# Patient Record
Sex: Male | Born: 1935 | Race: White | Hispanic: No | Smoking: Former smoker
Health system: Southern US, Community
[De-identification: ages and names within clinical notes are randomized; demographics above are authoritative.]

## PROBLEM LIST (undated history)

## (undated) DIAGNOSIS — J189 Pneumonia, unspecified organism: Secondary | ICD-10-CM

## (undated) DIAGNOSIS — I779 Disorder of arteries and arterioles, unspecified: Secondary | ICD-10-CM

## (undated) DIAGNOSIS — M5417 Radiculopathy, lumbosacral region: Secondary | ICD-10-CM

## (undated) DIAGNOSIS — I350 Nonrheumatic aortic (valve) stenosis: Secondary | ICD-10-CM

## (undated) DIAGNOSIS — R011 Cardiac murmur, unspecified: Secondary | ICD-10-CM

## (undated) DIAGNOSIS — E119 Type 2 diabetes mellitus without complications: Secondary | ICD-10-CM

## (undated) DIAGNOSIS — Z952 Presence of prosthetic heart valve: Secondary | ICD-10-CM

## (undated) DIAGNOSIS — I719 Aortic aneurysm of unspecified site, without rupture: Secondary | ICD-10-CM

## (undated) DIAGNOSIS — I509 Heart failure, unspecified: Secondary | ICD-10-CM

## (undated) DIAGNOSIS — M2040 Other hammer toe(s) (acquired), unspecified foot: Secondary | ICD-10-CM

## (undated) DIAGNOSIS — C8590 Non-Hodgkin lymphoma, unspecified, unspecified site: Secondary | ICD-10-CM

## (undated) DIAGNOSIS — G459 Transient cerebral ischemic attack, unspecified: Secondary | ICD-10-CM

## (undated) DIAGNOSIS — Z8673 Personal history of transient ischemic attack (TIA), and cerebral infarction without residual deficits: Secondary | ICD-10-CM

## (undated) DIAGNOSIS — I5032 Chronic diastolic (congestive) heart failure: Secondary | ICD-10-CM

## (undated) DIAGNOSIS — I480 Paroxysmal atrial fibrillation: Secondary | ICD-10-CM

## (undated) DIAGNOSIS — I10 Essential (primary) hypertension: Secondary | ICD-10-CM

## (undated) DIAGNOSIS — I4891 Unspecified atrial fibrillation: Secondary | ICD-10-CM

## (undated) DIAGNOSIS — J45909 Unspecified asthma, uncomplicated: Secondary | ICD-10-CM

## (undated) HISTORY — DX: Cardiac murmur, unspecified: R01.1

## (undated) HISTORY — DX: Other hammer toe(s) (acquired), unspecified foot: M20.40

---
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
## 1985-12-17 HISTORY — PX: TOTAL KNEE ARTHROPLASTY: SHX125

## 1988-11-16 HISTORY — PX: BACK SURGERY: SHX140

## 1989-06-16 HISTORY — PX: KIDNEY STONE SURGERY: SHX686

## 1994-12-17 HISTORY — PX: FOOT SURGERY: SHX648

## 1996-12-17 HISTORY — PX: ROTATOR CUFF REPAIR: SHX139

## 1999-11-17 HISTORY — PX: CATARACT EXTRACTION W/ INTRAOCULAR LENS IMPLANT: SHX1309

## 2000-07-17 HISTORY — PX: CATARACT EXTRACTION W/ INTRAOCULAR LENS IMPLANT: SHX1309

## 2002-05-17 HISTORY — PX: OTHER SURGICAL HISTORY: SHX169

## 2005-11-06 ENCOUNTER — Ambulatory Visit: Payer: Self-pay | Admitting: Family Medicine

## 2005-11-06 IMAGING — US US CAROTID DUPLEX BILAT
1 series · 17 of 24 positions shown · non-contrast
Comparison: none

REASON FOR EXAM: Right carotid bruit, history of  TIA
COMMENTS:

[Series 1: us carotid duplex bilat · 17 of 57 slices shown]
[im 1/57]
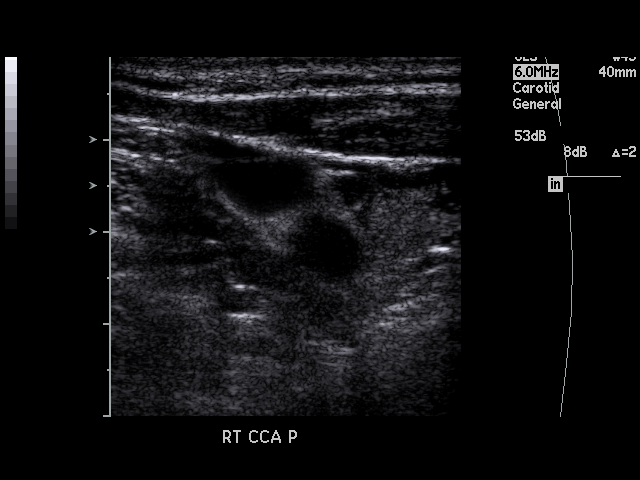
[im 5/57]
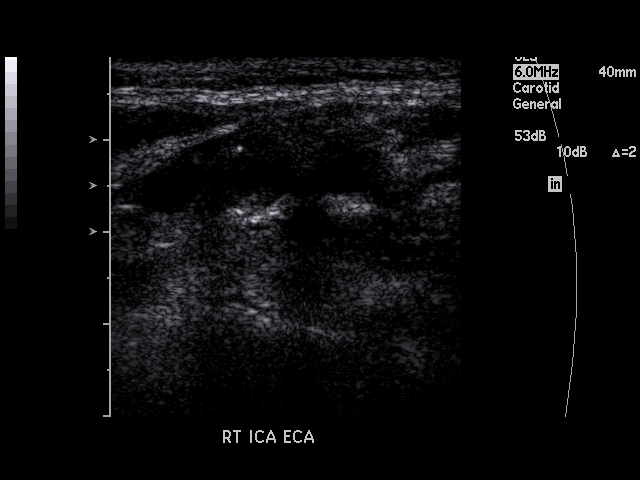
[im 8/57]
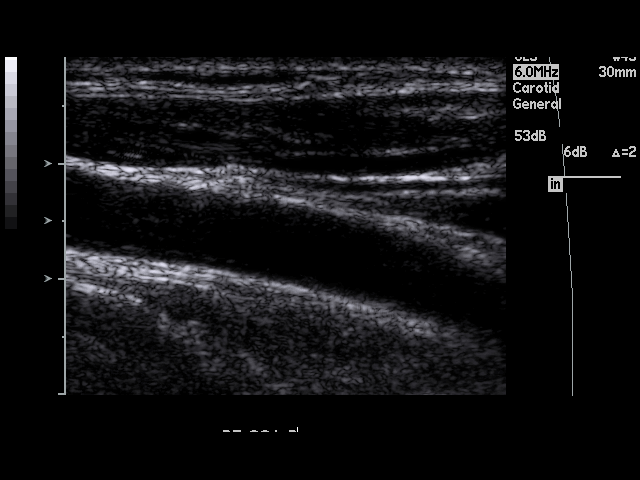
[im 10/57]
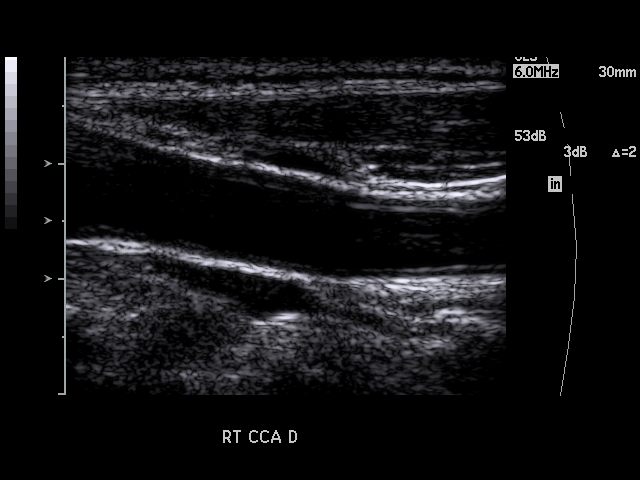
[im 15/57]
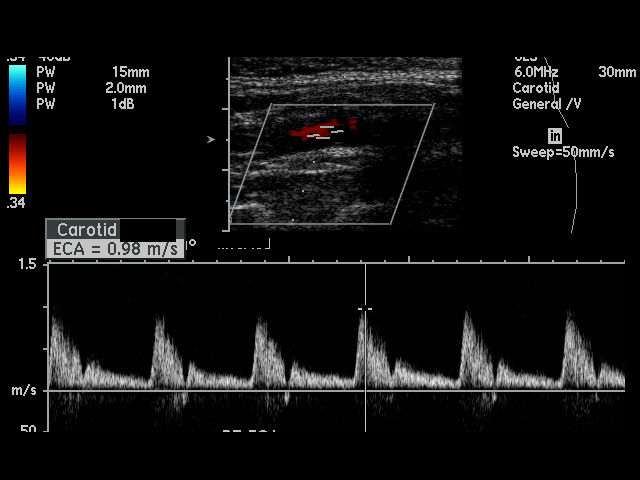
[im 18/57]
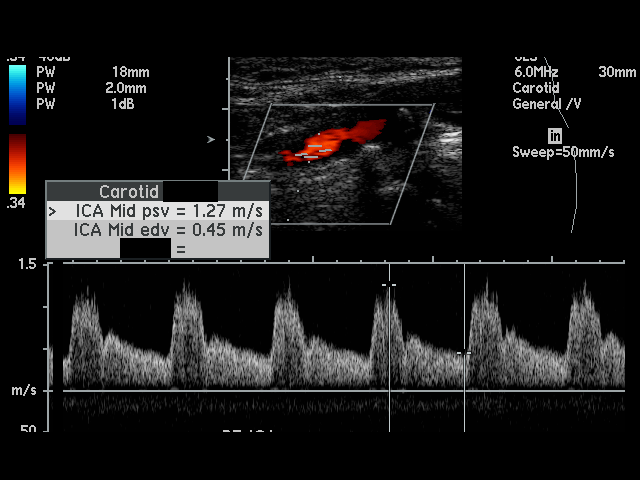
[im 22/57]
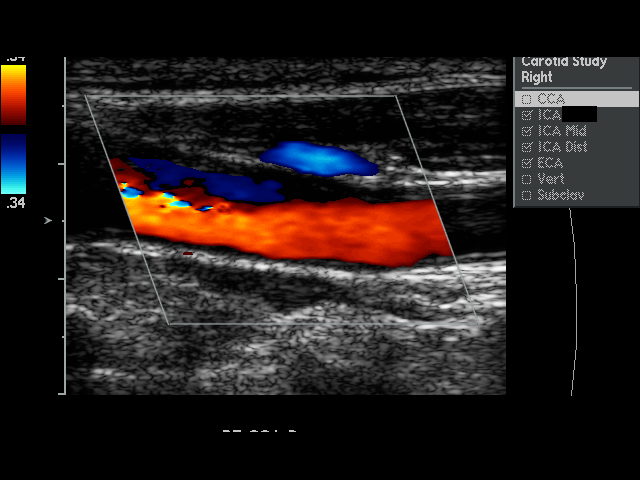
[im 25/57]
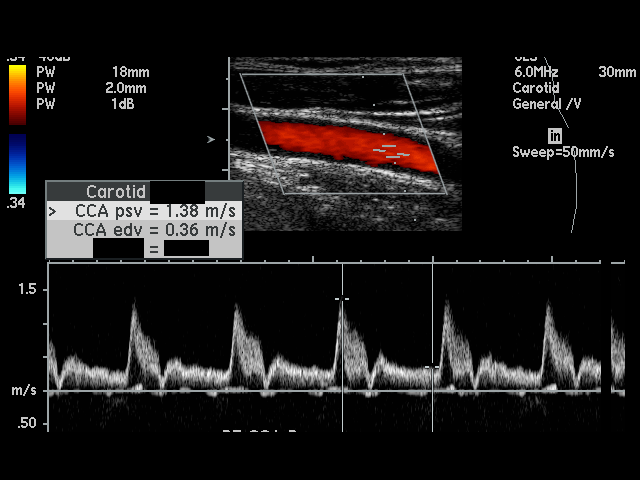
[im 30/57]
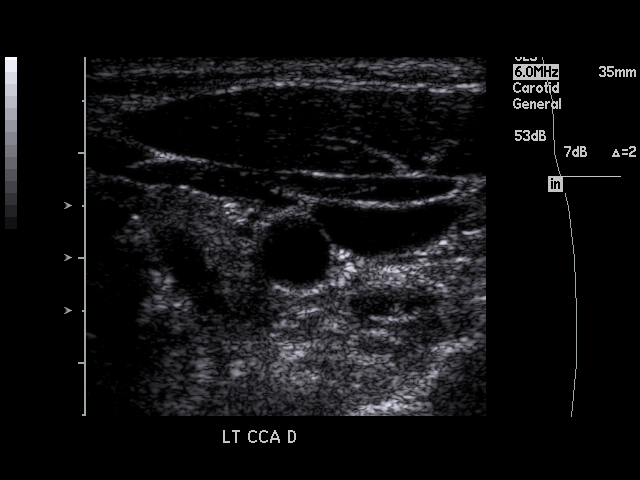
[im 32/57]
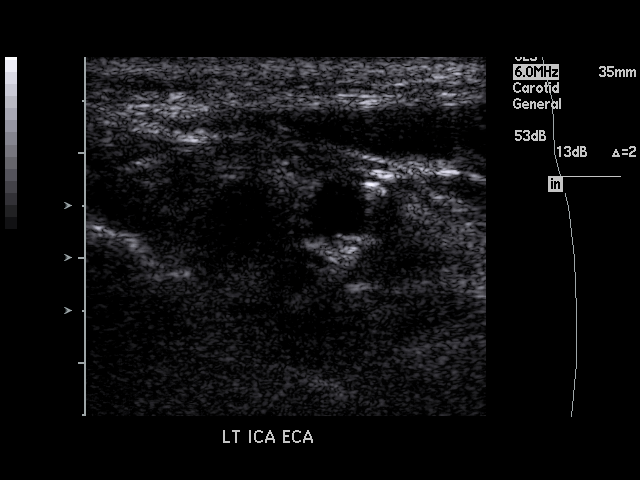
[im 35/57]
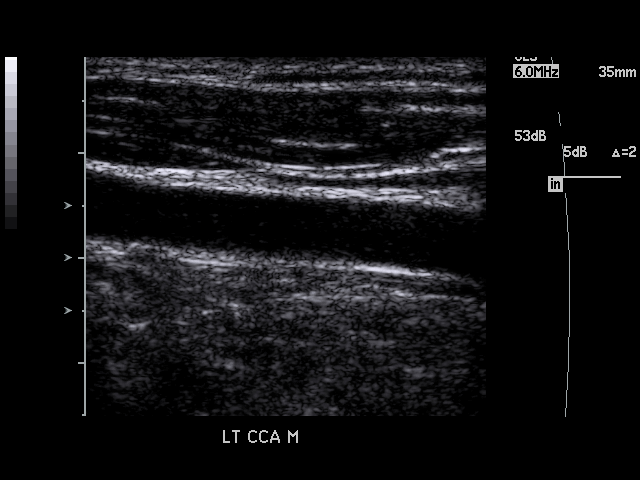
[im 39/57]
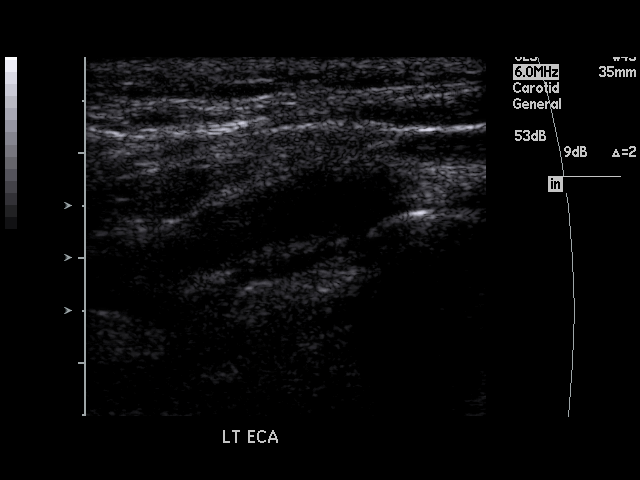
[im 42/57]
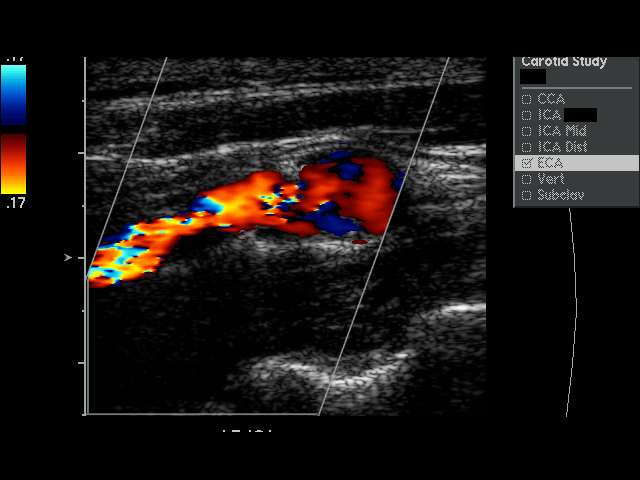
[im 47/57]
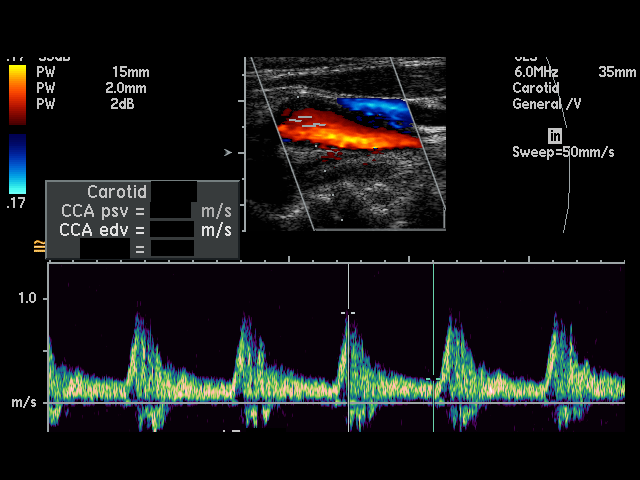
[im 49/57]
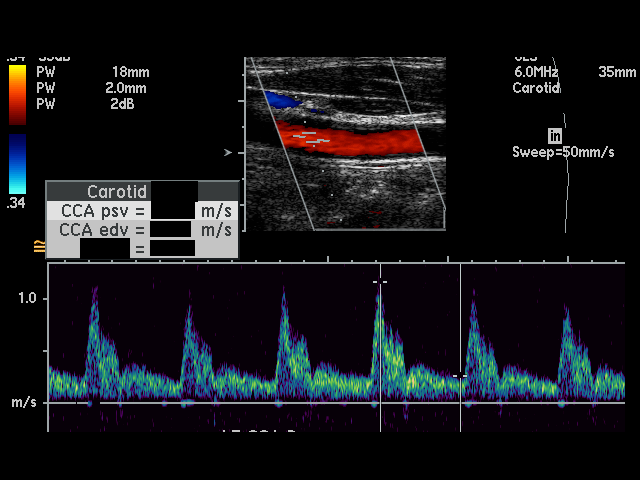
[im 52/57]
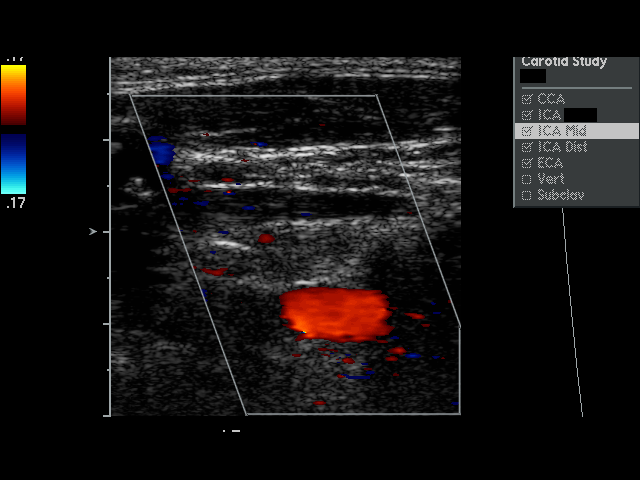
[im 57/57]
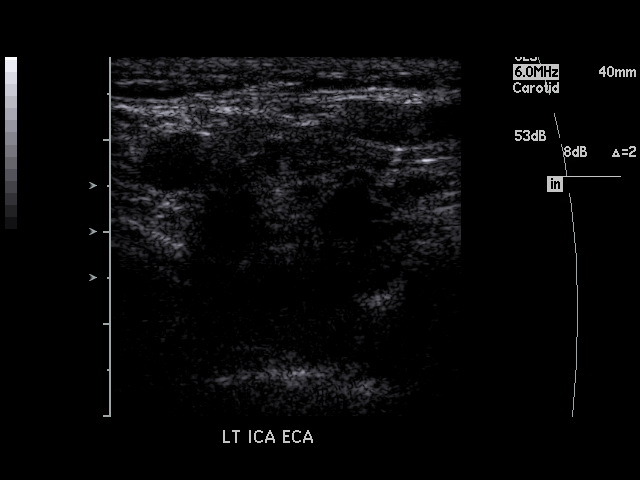

[17 of 24 positions shown; findings below may reference images not displayed]

PROCEDURE:     US  - US CAROTID DOPPLER BILATERAL  - [DATE] [DATE]

RESULT:     The patient has a history of TIA and has a RIGHT carotid bruit.

There is soft plaque at the carotid bulb and some soft and calcified plaque
in the proximal ICA on the RIGHT.

Peak internal carotid systolic velocity on the RIGHT measures 127.0 cm per
second with peak common carotid velocity measuring 138.0 cm per second,
corresponding to ratio of 0.9.

On the LEFT there is calcified plaque in the proximal ICA and the bulb. Peak
internal carotid systolic velocity on the LEFT is 88.0 cm per second with
peak common carotid velocity of 143.0 cm per second, corresponding to a
ratio of 0.6.

The color flow images reveal moderate amounts of turbulence on the LEFT.
This is in the ICA.

The vertebral arteries are normal in flow direction bilaterally.
IMPRESSION: I do not see evidence of a hemodynamically significant
carotid stenosis. There is plaque present bilaterally.

## 2006-08-06 ENCOUNTER — Inpatient Hospital Stay: Payer: Self-pay | Admitting: Specialist

## 2006-08-09 ENCOUNTER — Ambulatory Visit: Payer: Self-pay | Admitting: Cardiovascular Disease

## 2010-11-29 ENCOUNTER — Ambulatory Visit: Payer: Self-pay | Admitting: Family Medicine

## 2013-09-24 ENCOUNTER — Encounter: Payer: Self-pay | Admitting: Podiatry

## 2013-09-28 ENCOUNTER — Ambulatory Visit (INDEPENDENT_AMBULATORY_CARE_PROVIDER_SITE_OTHER): Payer: Medicare Other

## 2013-09-28 ENCOUNTER — Ambulatory Visit (INDEPENDENT_AMBULATORY_CARE_PROVIDER_SITE_OTHER): Payer: Medicare Other | Admitting: Podiatry

## 2013-09-28 ENCOUNTER — Encounter: Payer: Self-pay | Admitting: Podiatry

## 2013-09-28 ENCOUNTER — Ambulatory Visit: Payer: Self-pay

## 2013-09-28 DIAGNOSIS — M19079 Primary osteoarthritis, unspecified ankle and foot: Secondary | ICD-10-CM

## 2013-09-28 DIAGNOSIS — M79609 Pain in unspecified limb: Secondary | ICD-10-CM

## 2013-09-28 DIAGNOSIS — R609 Edema, unspecified: Secondary | ICD-10-CM

## 2013-09-28 NOTE — Progress Notes (Signed)
N SORE AND SWELLING  L LEFT ACROSS TOP OF FOOT AND BALL  D 3 MONTHS  O GRADUAL  C ABOUT THE SAME  A A LOT OF WALKING  T IBUPROFEN

## 2013-09-28 NOTE — Progress Notes (Signed)
Horace presents today as an 79 year old white male with a chief complaint of painful left foot. He states this been hurting on the top. Been hurting him for about 3 months. Has been to see his doctor at [facility]. Is Dr. Joella Prince suggested that it is really only osteoarthritis. This is the same doctors reconstructed his feet bilaterally. He states that the foot stays swollen primarily because of his left knee replacement. But has become more painful and the past few weeks. He denies any trauma.  Objective: Vital signs are stable he is alert and oriented x3. I have reviewed his past medical history medications allergies and review of systems. Has palpable pulses to the bilateral lower extremity. Marked edema to the left lower extremity. Neurologic sensorium is intact 1 through 5 bilaterally. Deep tendon reflexes are intact bilateral. Muscle strength is intact bilateral. Orthopedic evaluation should all joints distal to the ankle a full range of motion without crepitus. Several surgical incisions to the dorsal aspect of the bilateral foot. Radiographically evaluation does demonstrate osteoarthritis midfoot and forefoot. Edema left.  Assessment: Edema left. Osteoarthritis left.  Plan: Discussed surgical intervention. Etiology and pathology was discussed. At this point we discussed injection therapy. He declined. I will followup with him as needed.

## 2015-02-03 ENCOUNTER — Emergency Department: Payer: Self-pay | Admitting: Internal Medicine

## 2015-02-03 IMAGING — CR DG ABDOMEN 3V
1 series · 3 of 3 positions shown · non-contrast
Comparison: None

CLINICAL DATA: Abdominal pain infraumbilical to rectal region, no
bowel movement since [DATE], history of lymphoma post radiation
therapy, recently began taking new narcotic, no relief with 2 enemas
today at [M9] hr

EXAM:
ABDOMEN SERIES

[Series 1: w chest pa · 0.14mm/px · 3 of 3 slices shown]
[im 1/3]
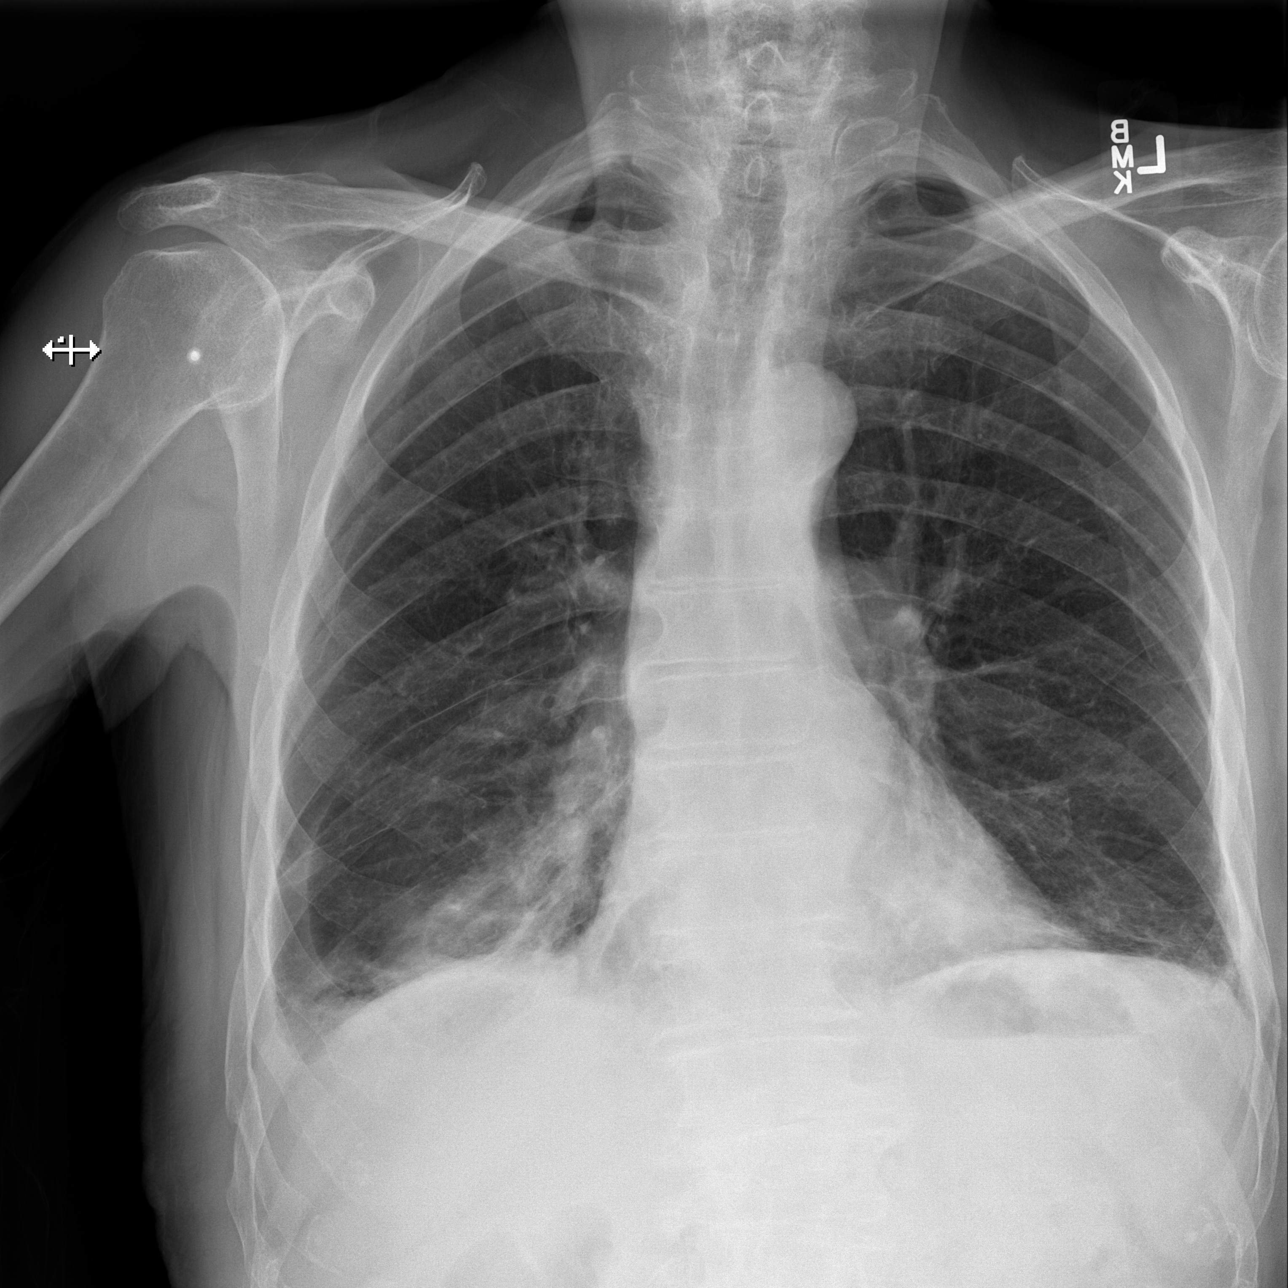
[im 2/3]
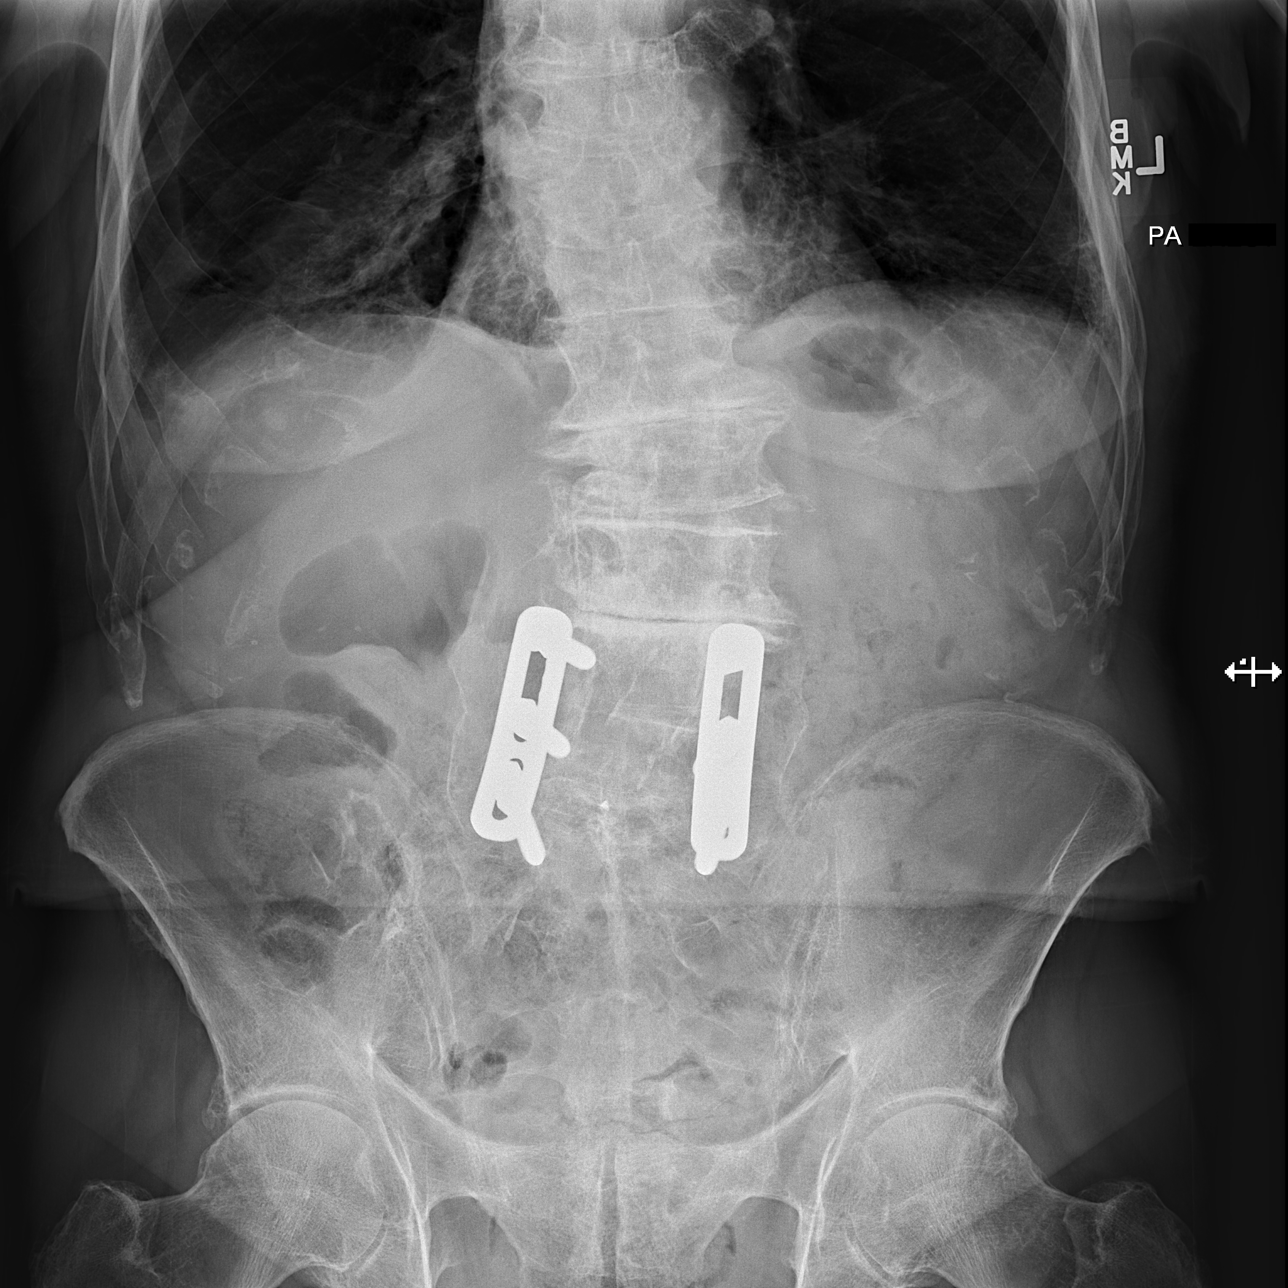
[im 3/3]
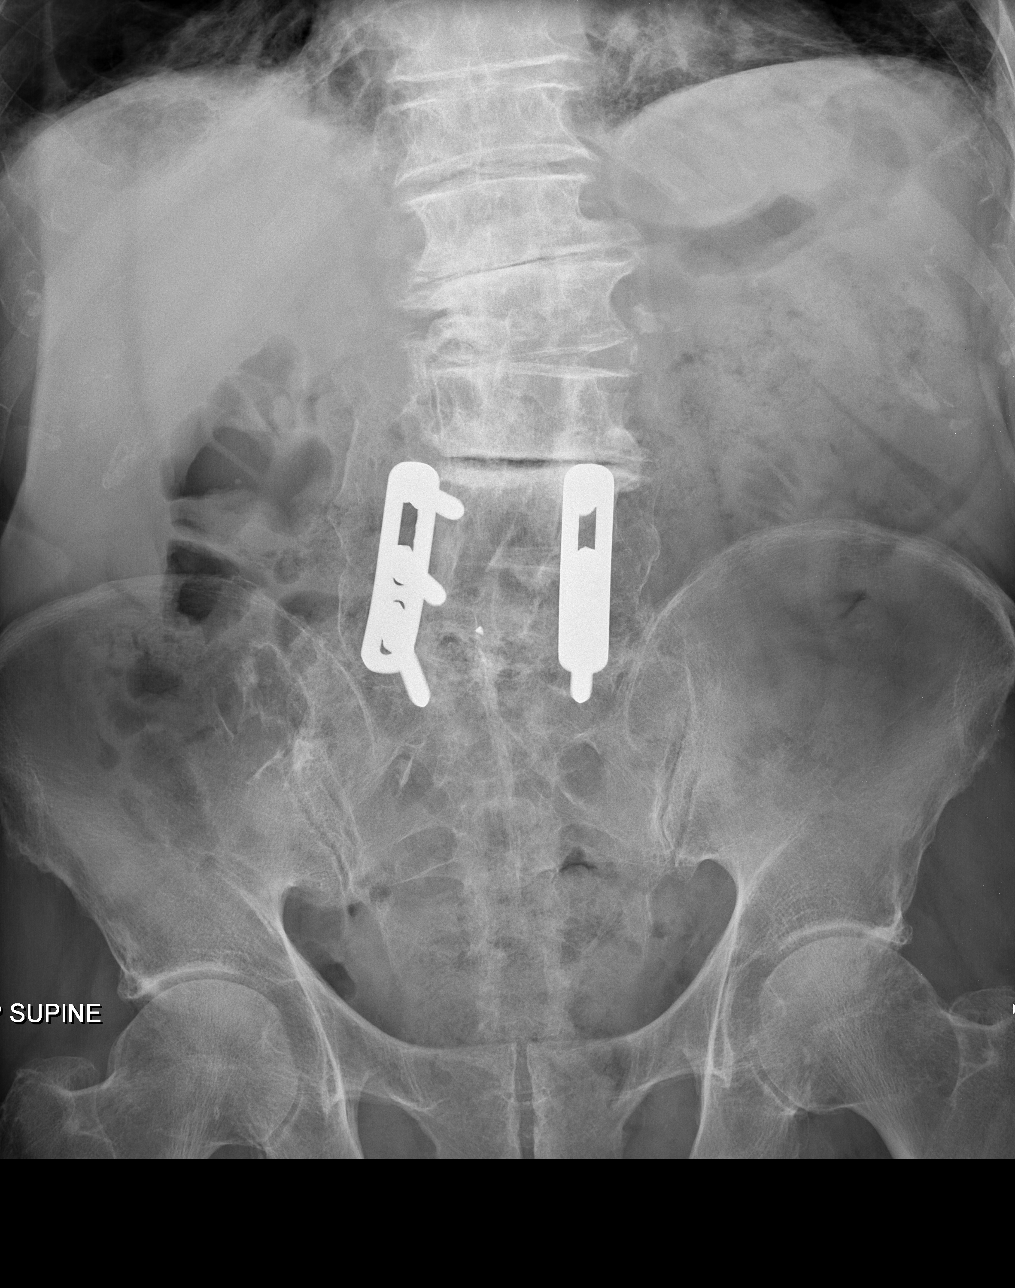

[3 of 3 positions shown; findings below may reference images not displayed]

FINDINGS: Normal heart size and pulmonary vascularity.

Tortuous thoracic aorta.

Bibasilar atelectasis, cannot completely exclude infiltrate medial
RIGHT lower lobe.

Underlying emphysematous changes.

Tiny RIGHT pleural effusion.

No pneumothorax.

Degenerative disc disease changes and scoliosis of the thoracolumbar
spine.

Prior L4-S1 fusion.

Nonobstructive bowel gas pattern.

Slightly increased stool in rectum.

No bowel dilatation, bowel wall thickening or free intraperitoneal
air.
IMPRESSION: Emphysematous changes with bibasilar atelectasis and tiny RIGHT
pleural effusion, unable to exclude RIGHT lower lobe infiltrate.

Slightly increased stool in rectum with otherwise normal bowel gas
pattern.

## 2018-12-23 IMAGING — US US RENAL
1 series · 14 of 25 positions shown · non-contrast
Comparison: None.

CLINICAL DATA: Chronic renal disease.

EXAM:
RENAL / URINARY TRACT ULTRASOUND COMPLETE

[Series 1: us renal · 0.22mm/px · 14 of 38 slices shown]
[im 1/38]
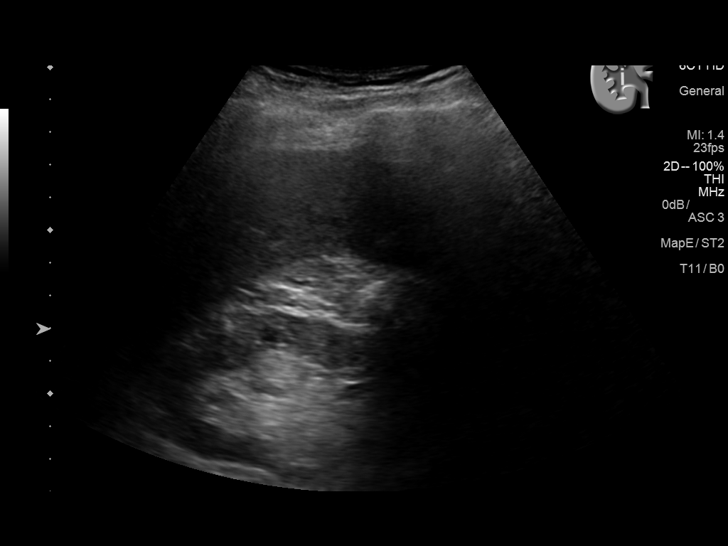
[im 4/38]
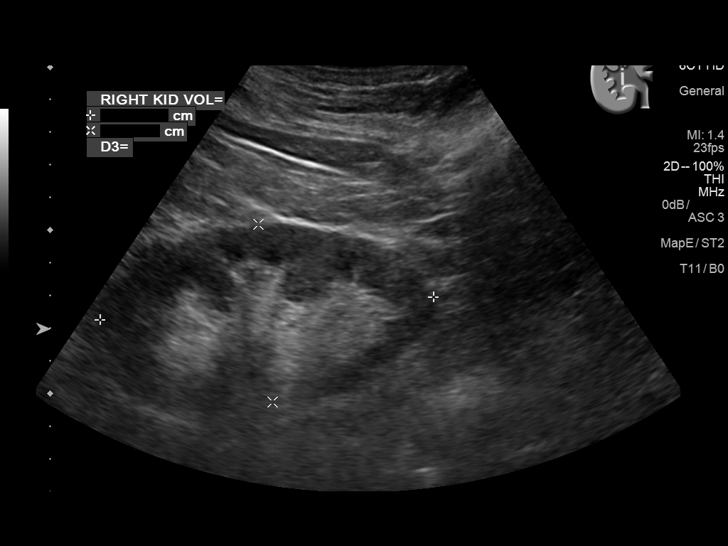
[im 7/38]
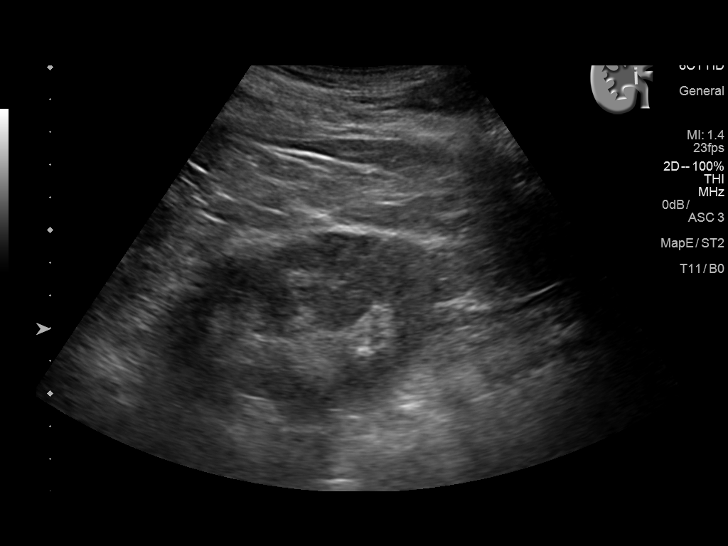
[im 10/38]
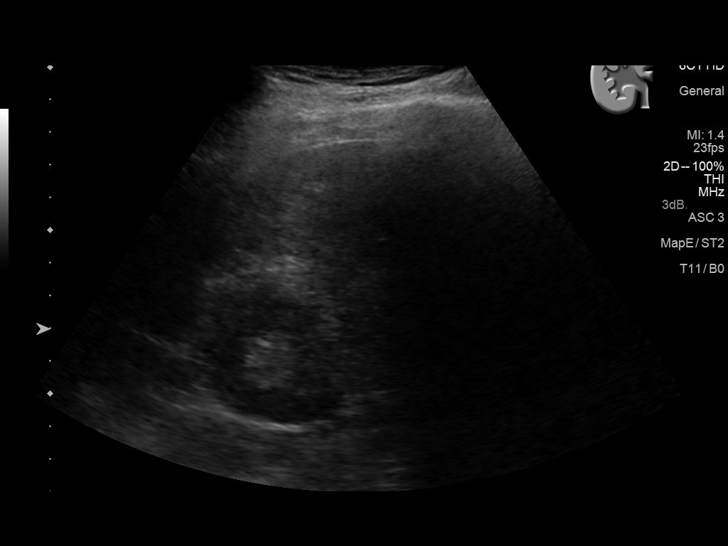
[im 13/38]
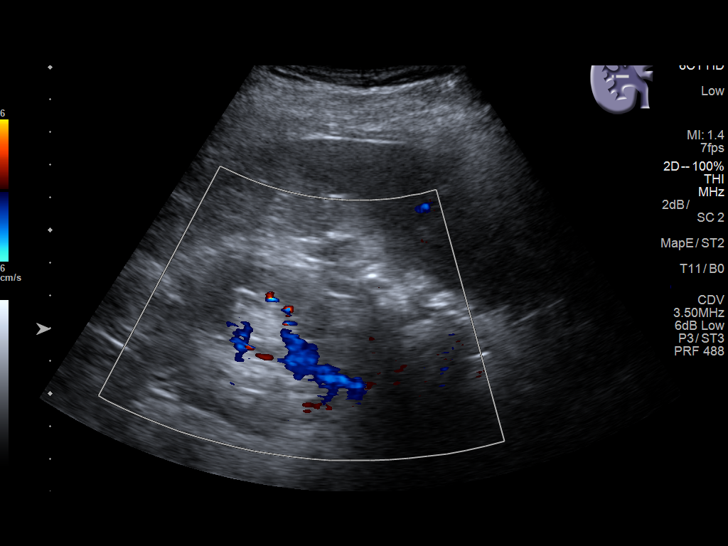
[im 14/38]
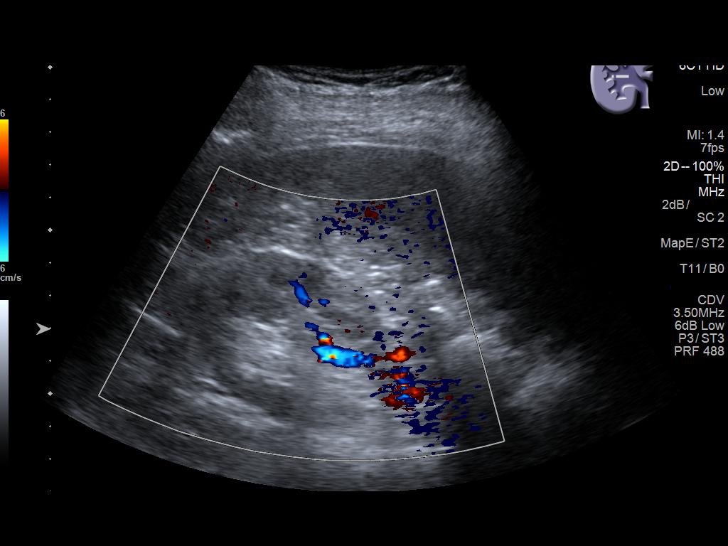
[im 17/38]
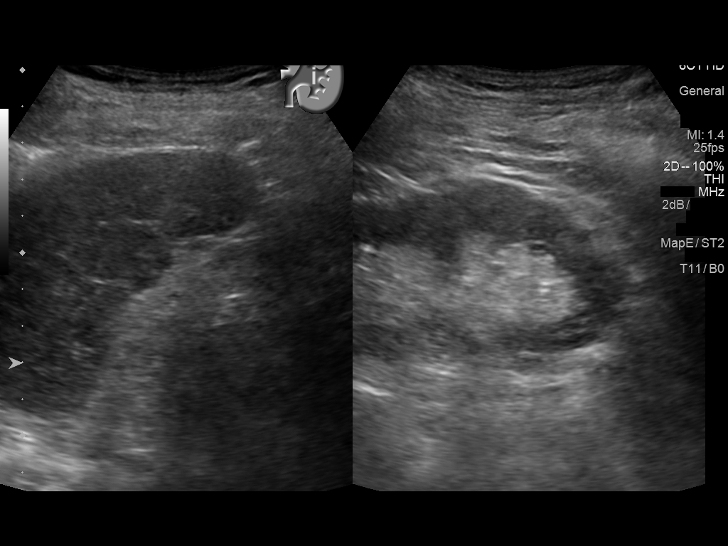
[im 21/38]
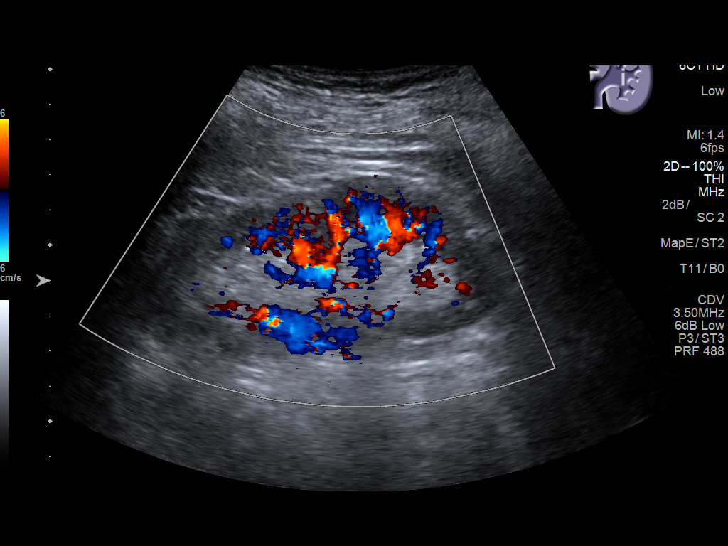
[im 24/38]
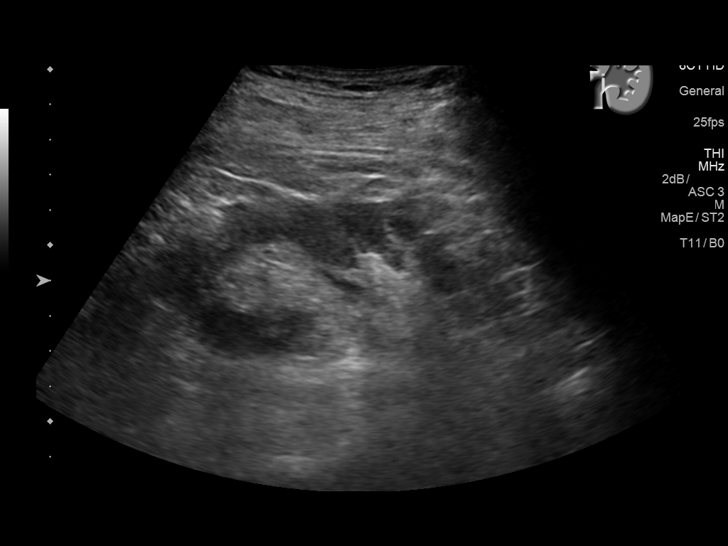
[im 25/38]
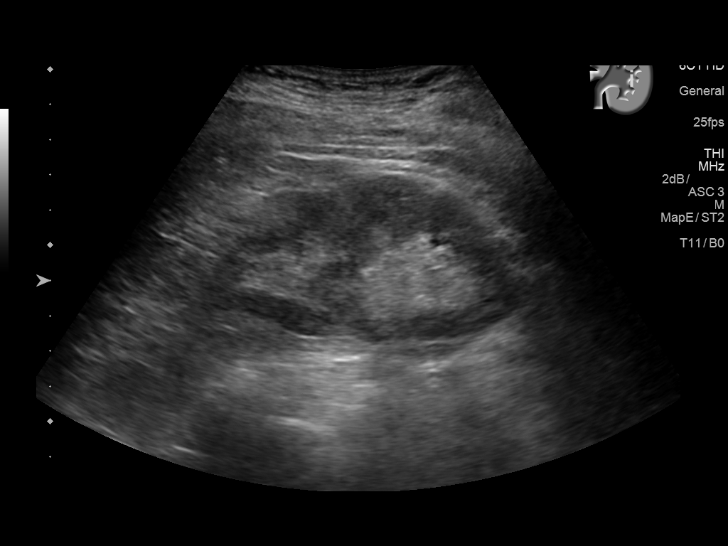
[im 28/38]
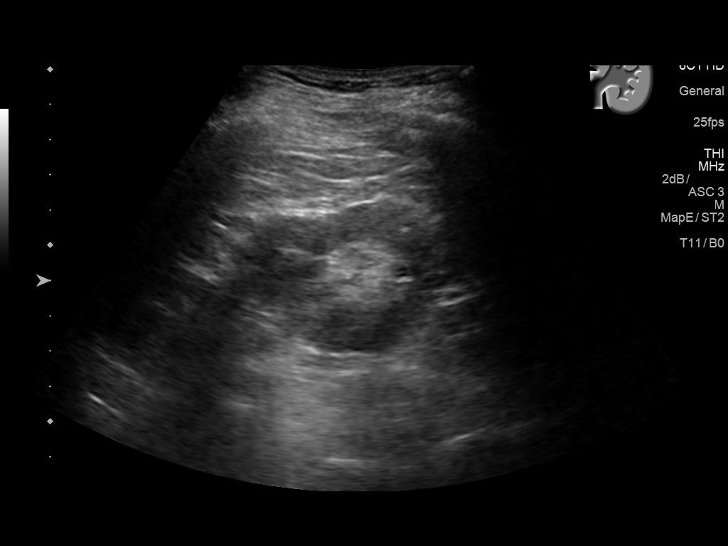
[im 31/38]
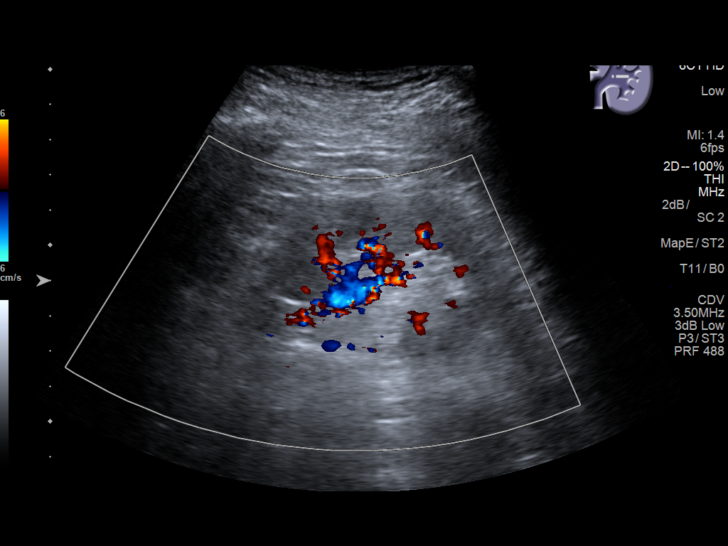
[im 34/38]
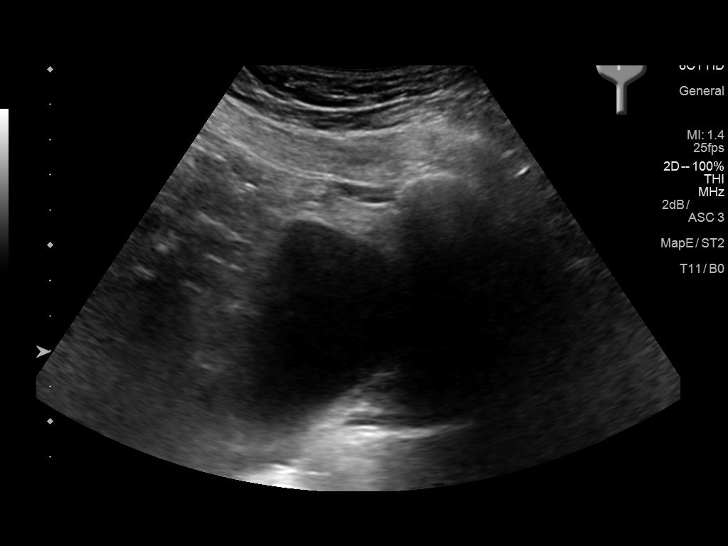
[im 38/38]
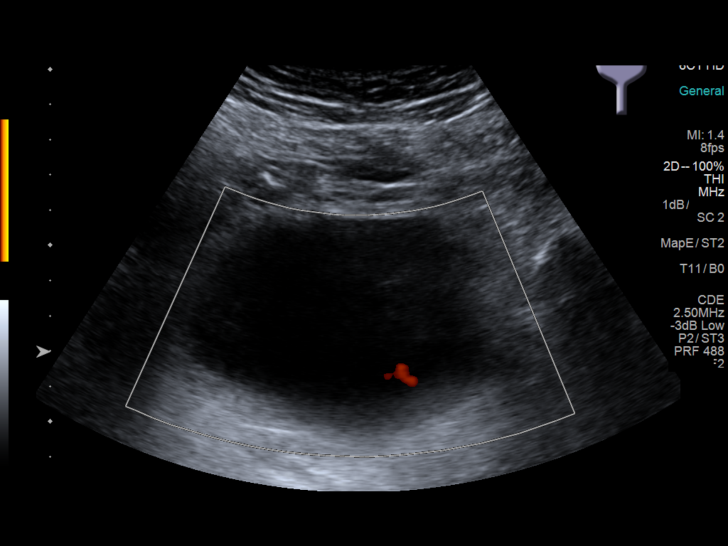

[14 of 25 positions shown; findings below may reference images not displayed]

FINDINGS: Right Kidney:

Renal measurements: 10.2 x 5.5 x 4.1 cm = volume: [87] mL .
Echogenicity within normal limits. No mass or hydronephrosis
visualized.

Left Kidney:

Renal measurements: 11.1 x 4.6 x 4.3 cm = volume: 113.9 mL.
Echogenicity within normal limits. No mass or hydronephrosis
visualized.

Bladder:

Appears normal for degree of bladder distention.
IMPRESSION: No hydronephrosis.

## 2019-02-24 ENCOUNTER — Encounter: Payer: Self-pay | Admitting: Emergency Medicine

## 2019-02-24 ENCOUNTER — Inpatient Hospital Stay
Admission: EM | Admit: 2019-02-24 | Discharge: 2019-03-01 | DRG: 291 | Disposition: A | Discharge status: Home Health | Payer: Medicare Other | Attending: Internal Medicine | Admitting: Internal Medicine

## 2019-02-24 ENCOUNTER — Other Ambulatory Visit: Payer: Self-pay

## 2019-02-24 ENCOUNTER — Emergency Department: Payer: Medicare Other

## 2019-02-24 DIAGNOSIS — R059 Cough, unspecified: Secondary | ICD-10-CM

## 2019-02-24 DIAGNOSIS — R739 Hyperglycemia, unspecified: Secondary | ICD-10-CM | POA: Diagnosis present

## 2019-02-24 DIAGNOSIS — Z87891 Personal history of nicotine dependence: Secondary | ICD-10-CM | POA: Diagnosis not present

## 2019-02-24 DIAGNOSIS — Z8249 Family history of ischemic heart disease and other diseases of the circulatory system: Secondary | ICD-10-CM

## 2019-02-24 DIAGNOSIS — R0600 Dyspnea, unspecified: Secondary | ICD-10-CM

## 2019-02-24 DIAGNOSIS — I480 Paroxysmal atrial fibrillation: Secondary | ICD-10-CM | POA: Diagnosis present

## 2019-02-24 DIAGNOSIS — I248 Other forms of acute ischemic heart disease: Secondary | ICD-10-CM | POA: Diagnosis present

## 2019-02-24 DIAGNOSIS — H9190 Unspecified hearing loss, unspecified ear: Secondary | ICD-10-CM | POA: Diagnosis present

## 2019-02-24 DIAGNOSIS — Z79899 Other long term (current) drug therapy: Secondary | ICD-10-CM

## 2019-02-24 DIAGNOSIS — I5033 Acute on chronic diastolic (congestive) heart failure: Secondary | ICD-10-CM | POA: Diagnosis present

## 2019-02-24 DIAGNOSIS — I5031 Acute diastolic (congestive) heart failure: Secondary | ICD-10-CM | POA: Diagnosis not present

## 2019-02-24 DIAGNOSIS — Z8572 Personal history of non-Hodgkin lymphomas: Secondary | ICD-10-CM | POA: Diagnosis not present

## 2019-02-24 DIAGNOSIS — J9600 Acute respiratory failure, unspecified whether with hypoxia or hypercapnia: Secondary | ICD-10-CM

## 2019-02-24 DIAGNOSIS — J181 Lobar pneumonia, unspecified organism: Secondary | ICD-10-CM | POA: Diagnosis present

## 2019-02-24 DIAGNOSIS — Z7982 Long term (current) use of aspirin: Secondary | ICD-10-CM | POA: Diagnosis not present

## 2019-02-24 DIAGNOSIS — R0603 Acute respiratory distress: Secondary | ICD-10-CM | POA: Diagnosis present

## 2019-02-24 DIAGNOSIS — R05 Cough: Secondary | ICD-10-CM

## 2019-02-24 DIAGNOSIS — I130 Hypertensive heart and chronic kidney disease with heart failure and stage 1 through stage 4 chronic kidney disease, or unspecified chronic kidney disease: Principal | ICD-10-CM | POA: Diagnosis present

## 2019-02-24 DIAGNOSIS — Z8673 Personal history of transient ischemic attack (TIA), and cerebral infarction without residual deficits: Secondary | ICD-10-CM

## 2019-02-24 DIAGNOSIS — J9601 Acute respiratory failure with hypoxia: Secondary | ICD-10-CM | POA: Diagnosis present

## 2019-02-24 DIAGNOSIS — Z96653 Presence of artificial knee joint, bilateral: Secondary | ICD-10-CM | POA: Diagnosis present

## 2019-02-24 DIAGNOSIS — Z66 Do not resuscitate: Secondary | ICD-10-CM | POA: Diagnosis present

## 2019-02-24 DIAGNOSIS — I350 Nonrheumatic aortic (valve) stenosis: Secondary | ICD-10-CM | POA: Diagnosis present

## 2019-02-24 DIAGNOSIS — J189 Pneumonia, unspecified organism: Secondary | ICD-10-CM

## 2019-02-24 DIAGNOSIS — E785 Hyperlipidemia, unspecified: Secondary | ICD-10-CM | POA: Diagnosis present

## 2019-02-24 DIAGNOSIS — R0902 Hypoxemia: Secondary | ICD-10-CM

## 2019-02-24 DIAGNOSIS — N183 Chronic kidney disease, stage 3 (moderate): Secondary | ICD-10-CM | POA: Diagnosis present

## 2019-02-24 DIAGNOSIS — I509 Heart failure, unspecified: Secondary | ICD-10-CM

## 2019-02-24 DIAGNOSIS — Z7901 Long term (current) use of anticoagulants: Secondary | ICD-10-CM

## 2019-02-24 HISTORY — DX: Nonrheumatic aortic (valve) stenosis: I35.0

## 2019-02-24 HISTORY — DX: Essential (primary) hypertension: I10

## 2019-02-24 HISTORY — DX: Transient cerebral ischemic attack, unspecified: G45.9

## 2019-02-24 HISTORY — DX: Unspecified atrial fibrillation: I48.91

## 2019-02-24 HISTORY — DX: Aortic aneurysm of unspecified site, without rupture: I71.9

## 2019-02-24 HISTORY — DX: Heart failure, unspecified: I50.9

## 2019-02-24 HISTORY — DX: Unspecified asthma, uncomplicated: J45.909

## 2019-02-24 LAB — TROPONIN I: Troponin I: 0.29 ng/mL (ref <0.03)

## 2019-02-24 LAB — BASIC METABOLIC PANEL
Anion gap: 10 (ref 5–15)
BUN: 25 mg/dL — ABNORMAL HIGH (ref 8–23)
CO2: 20 mmol/L — ABNORMAL LOW (ref 22–32)
Calcium: 9 mg/dL (ref 8.9–10.3)
Chloride: 109 mmol/L (ref 98–111)
Creatinine, Ser: 1.3 mg/dL — ABNORMAL HIGH (ref 0.61–1.24)
GFR calc Af Amer: 57 mL/min — ABNORMAL LOW (ref >60)
GFR calc non Af Amer: 49 mL/min — ABNORMAL LOW (ref >60)
Glucose, Bld: 237 mg/dL — ABNORMAL HIGH (ref 70–99)
POTASSIUM: 4.2 mmol/L (ref 3.5–5.1)
Sodium: 139 mmol/L (ref 135–145)

## 2019-02-24 LAB — BRAIN NATRIURETIC PEPTIDE: B Natriuretic Peptide: 1114 pg/mL — ABNORMAL HIGH (ref 0.0–100.0)

## 2019-02-24 LAB — GLUCOSE, CAPILLARY
Glucose-Capillary: 192 mg/dL — ABNORMAL HIGH (ref 70–99)
Glucose-Capillary: 220 mg/dL — ABNORMAL HIGH (ref 70–99)
Glucose-Capillary: 203 mg/dL — ABNORMAL HIGH (ref 70–99)

## 2019-02-24 LAB — CBC WITH DIFFERENTIAL/PLATELET
Abs Immature Granulocytes: 0.11 10*3/uL — ABNORMAL HIGH (ref 0.00–0.07)
Basophils Absolute: 0 10*3/uL (ref 0.0–0.1)
Basophils Relative: 0 %
Eosinophils Absolute: 0 10*3/uL (ref 0.0–0.5)
Eosinophils Relative: 0 %
HEMATOCRIT: 41.8 % (ref 39.0–52.0)
Hemoglobin: 13.9 g/dL (ref 13.0–17.0)
Immature Granulocytes: 1 %
LYMPHS ABS: 1.6 10*3/uL (ref 0.7–4.0)
Lymphocytes Relative: 9 %
MCH: 32.1 pg (ref 26.0–34.0)
MCHC: 33.3 g/dL (ref 30.0–36.0)
MCV: 96.5 fL (ref 80.0–100.0)
MONO ABS: 0.7 10*3/uL (ref 0.1–1.0)
MONOS PCT: 4 %
Neutro Abs: 14.8 10*3/uL — ABNORMAL HIGH (ref 1.7–7.7)
Neutrophils Relative %: 86 %
Platelets: 155 10*3/uL (ref 150–400)
RBC: 4.33 MIL/uL (ref 4.22–5.81)
RDW: 12.7 % (ref 11.5–15.5)
WBC: 17.3 10*3/uL — ABNORMAL HIGH (ref 4.0–10.5)
nRBC: 0 % (ref 0.0–0.2)

## 2019-02-24 LAB — LACTIC ACID, PLASMA: Lactic Acid, Venous: 1.7 mmol/L (ref 0.5–1.9)

## 2019-02-24 LAB — PROCALCITONIN

## 2019-02-24 LAB — MRSA PCR SCREENING: MRSA BY PCR: NEGATIVE

## 2019-02-24 IMAGING — DX PORTABLE CHEST - 1 VIEW
1 series · 1 of 1 positions shown · non-contrast
Comparison: None.

CLINICAL DATA: Shortness of Breath

EXAM:
PORTABLE CHEST 1 VIEW

[chest ap]
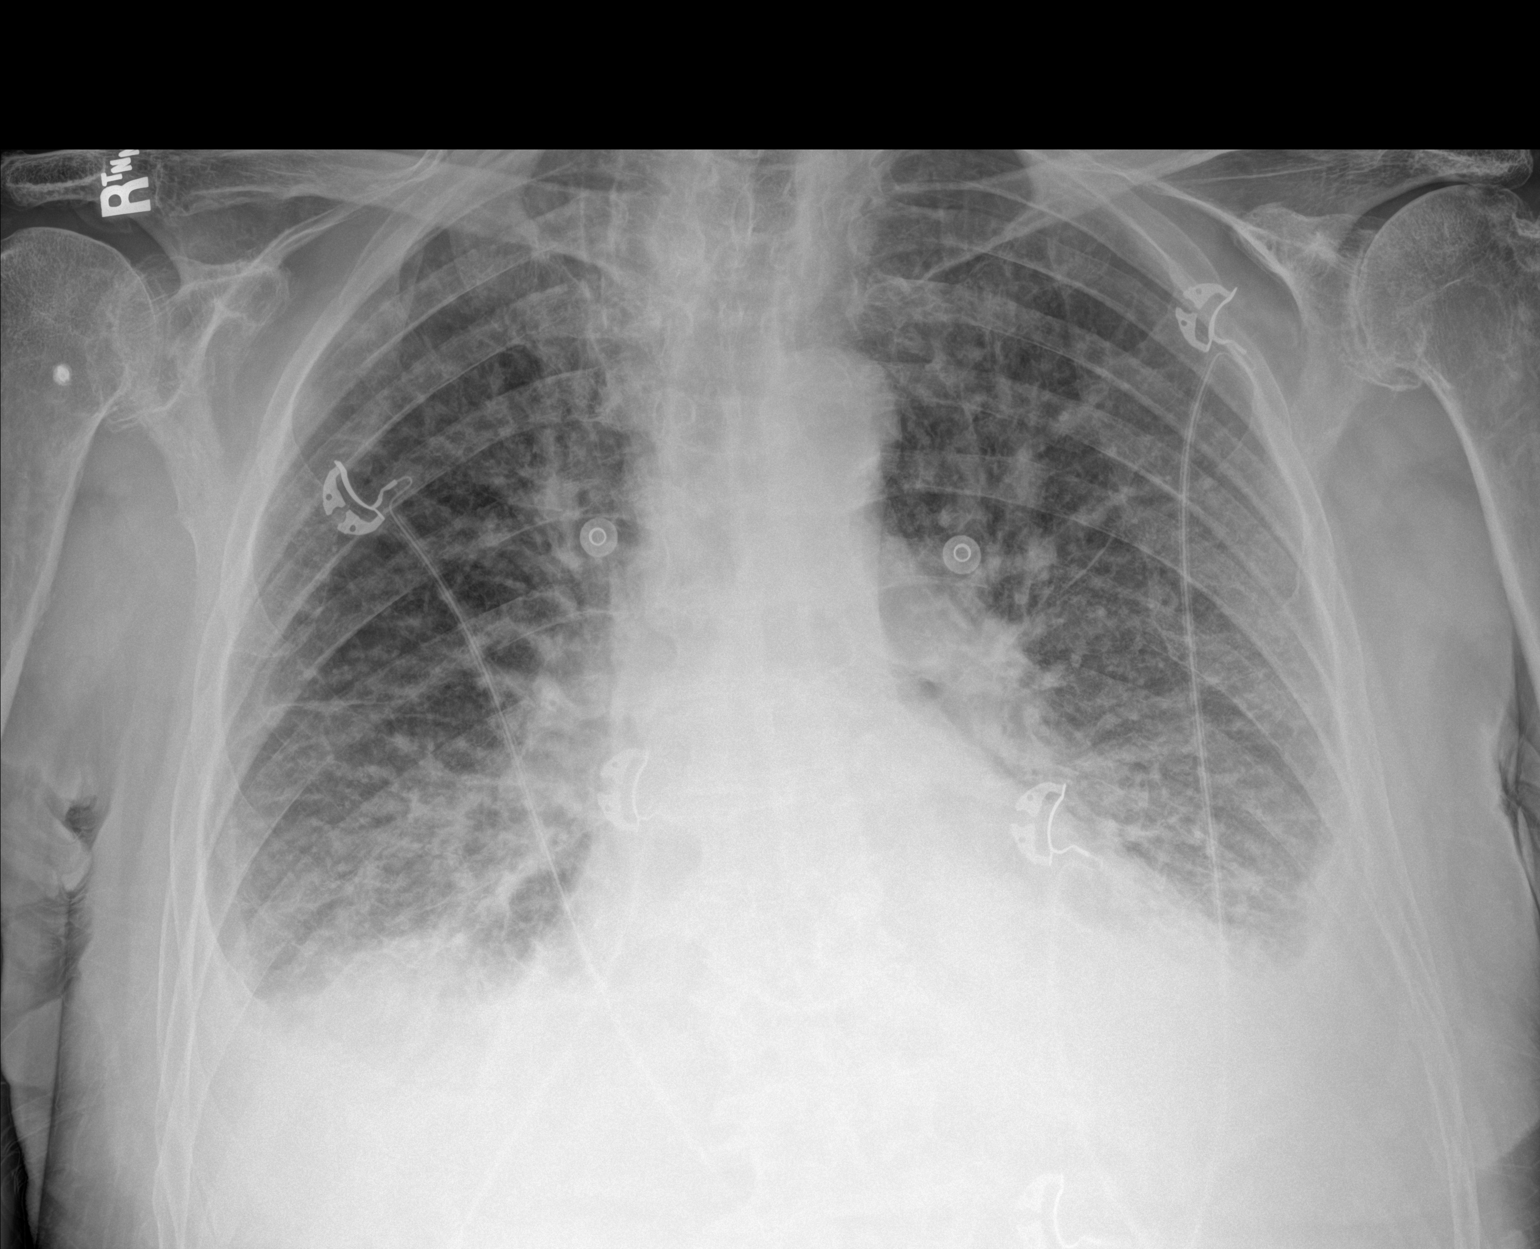

[1 of 1 positions shown; findings below may reference images not displayed]

FINDINGS: There is cardiomegaly with pulmonary venous hypertension. There is
diffuse interstitial edema. There are pleural effusions bilaterally.
There is airspace consolidation in the left base region. No other
airspace consolidation evident. No adenopathy appreciable. There is
degenerative change in each shoulder.
IMPRESSION: Pulmonary vascular congestion with interstitial edema and bilateral
pleural effusions. There is felt to be a degree of congestive heart
failure. Suspect pneumonia superimposed in the left base, although
opacity in this area may be secondary to alveolar edema. No
adenopathy.

## 2019-02-24 MED ORDER — ENOXAPARIN SODIUM 40 MG/0.4ML ~~LOC~~ SOLN: 40.0000 mg | SUBCUTANEOUS | Status: DC

## 2019-02-24 MED ORDER — SODIUM CHLORIDE 0.9 % IV SOLN
500.0000 mg | INTRAVENOUS | Status: DC
  Filled 2019-02-24: qty 500

## 2019-02-24 MED ORDER — MIRTAZAPINE 15 MG PO TABS
7.5000 mg | ORAL_TABLET | Freq: Every day | ORAL | Status: DC
  Administered 2019-02-24 – 2019-02-28 (×5): 7.5 mg via ORAL
  Filled 2019-02-24 (×5): qty 1

## 2019-02-24 MED ORDER — ALBUTEROL SULFATE (2.5 MG/3ML) 0.083% IN NEBU: 2.5000 mg | INHALATION_SOLUTION | RESPIRATORY_TRACT | Status: DC | PRN

## 2019-02-24 MED ORDER — ONDANSETRON HCL 4 MG/2ML IJ SOLN: 4.0000 mg | Freq: Four times a day (QID) | INTRAMUSCULAR | Status: DC | PRN

## 2019-02-24 MED ORDER — APIXABAN 5 MG PO TABS
5.0000 mg | ORAL_TABLET | Freq: Two times a day (BID) | ORAL | Status: DC
  Administered 2019-02-24 (×2): 5 mg via ORAL
  Filled 2019-02-24 (×2): qty 1

## 2019-02-24 MED ORDER — FUROSEMIDE 10 MG/ML IJ SOLN
60.0000 mg | Freq: Two times a day (BID) | INTRAMUSCULAR | Status: DC
  Administered 2019-02-24 – 2019-02-26 (×4): 60 mg via INTRAVENOUS
  Filled 2019-02-24 (×4): qty 6

## 2019-02-24 MED ORDER — FUROSEMIDE 10 MG/ML IJ SOLN
80.0000 mg | Freq: Once | INTRAMUSCULAR | Status: AC
  Administered 2019-02-24: 80 mg via INTRAVENOUS
  Filled 2019-02-24: qty 8

## 2019-02-24 MED ORDER — METOPROLOL SUCCINATE ER 50 MG PO TB24
50.0000 mg | ORAL_TABLET | Freq: Every day | ORAL | Status: DC
  Administered 2019-02-24 – 2019-03-01 (×6): 50 mg via ORAL
  Filled 2019-02-24 (×6): qty 1

## 2019-02-24 MED ORDER — SODIUM CHLORIDE 0.9% FLUSH
3.0000 mL | Freq: Two times a day (BID) | INTRAVENOUS | Status: DC
  Administered 2019-02-24 – 2019-02-28 (×9): 3 mL via INTRAVENOUS

## 2019-02-24 MED ORDER — BUDESONIDE 0.5 MG/2ML IN SUSP
0.5000 mg | Freq: Two times a day (BID) | RESPIRATORY_TRACT | Status: DC
  Administered 2019-02-24 – 2019-03-01 (×10): 0.5 mg via RESPIRATORY_TRACT
  Filled 2019-02-24 (×10): qty 2

## 2019-02-24 MED ORDER — LEVOFLOXACIN IN D5W 750 MG/150ML IV SOLN
750.0000 mg | Freq: Once | INTRAVENOUS | Status: AC
  Administered 2019-02-24: 750 mg via INTRAVENOUS
  Filled 2019-02-24: qty 150

## 2019-02-24 MED ORDER — ATORVASTATIN CALCIUM 20 MG PO TABS
40.0000 mg | ORAL_TABLET | Freq: Every day | ORAL | Status: DC
  Administered 2019-02-24 – 2019-03-01 (×6): 40 mg via ORAL
  Filled 2019-02-24 (×6): qty 2

## 2019-02-24 MED ORDER — MECLIZINE HCL 12.5 MG PO TABS
12.5000 mg | ORAL_TABLET | Freq: Three times a day (TID) | ORAL | Status: DC | PRN
  Filled 2019-02-24: qty 1

## 2019-02-24 MED ORDER — PANTOPRAZOLE SODIUM 40 MG PO TBEC
40.0000 mg | DELAYED_RELEASE_TABLET | Freq: Every day | ORAL | Status: DC
  Administered 2019-02-24 – 2019-03-01 (×6): 40 mg via ORAL
  Filled 2019-02-24 (×6): qty 1

## 2019-02-24 MED ORDER — SODIUM CHLORIDE 0.9 % IV SOLN: 500.0000 mg | INTRAVENOUS | Status: DC

## 2019-02-24 MED ORDER — INSULIN ASPART 100 UNIT/ML ~~LOC~~ SOLN
0.0000 [IU] | SUBCUTANEOUS | Status: DC
  Administered 2019-02-24 (×2): 5 [IU] via SUBCUTANEOUS
  Administered 2019-02-25: 2 [IU] via SUBCUTANEOUS
  Administered 2019-02-25: 3 [IU] via SUBCUTANEOUS
  Filled 2019-02-24 (×4): qty 1

## 2019-02-24 MED ORDER — FLUTICASONE PROPIONATE HFA 110 MCG/ACT IN AERO: 1.0000 | INHALATION_SPRAY | Freq: Two times a day (BID) | RESPIRATORY_TRACT | Status: DC

## 2019-02-24 MED ORDER — FAMOTIDINE 20 MG PO TABS: 10.0000 mg | ORAL_TABLET | Freq: Every day | ORAL | Status: DC | PRN

## 2019-02-24 MED ORDER — POLYETHYLENE GLYCOL 3350 17 G PO PACK
17.0000 g | PACK | Freq: Every day | ORAL | Status: DC | PRN
  Filled 2019-02-24: qty 1

## 2019-02-24 MED ORDER — METOPROLOL SUCCINATE ER 50 MG PO TB24: 50.0000 mg | ORAL_TABLET | Freq: Every day | ORAL | Status: DC

## 2019-02-24 MED ORDER — ACETAMINOPHEN 325 MG PO TABS
650.0000 mg | ORAL_TABLET | Freq: Four times a day (QID) | ORAL | Status: DC | PRN
  Administered 2019-02-25 – 2019-02-28 (×2): 650 mg via ORAL
  Filled 2019-02-24 (×2): qty 2

## 2019-02-24 MED ORDER — SODIUM CHLORIDE 0.9 % IV SOLN
1.0000 g | INTRAVENOUS | Status: DC
  Filled 2019-02-24: qty 10

## 2019-02-24 MED ORDER — GUAIFENESIN ER 600 MG PO TB12: 600.0000 mg | ORAL_TABLET | Freq: Two times a day (BID) | ORAL | Status: DC | PRN

## 2019-02-24 MED ORDER — ASPIRIN EC 81 MG PO TBEC
81.0000 mg | DELAYED_RELEASE_TABLET | Freq: Every day | ORAL | Status: DC
  Administered 2019-02-25 – 2019-03-01 (×5): 81 mg via ORAL
  Filled 2019-02-24 (×5): qty 1

## 2019-02-24 MED ORDER — BENZONATATE 100 MG PO CAPS: 200.0000 mg | ORAL_CAPSULE | Freq: Three times a day (TID) | ORAL | Status: DC | PRN

## 2019-02-24 MED ORDER — SODIUM CHLORIDE 0.9 % IV SOLN: 1.0000 g | INTRAVENOUS | Status: DC

## 2019-02-24 MED ORDER — ACETAMINOPHEN 650 MG RE SUPP: 650.0000 mg | Freq: Four times a day (QID) | RECTAL | Status: DC | PRN

## 2019-02-24 MED ORDER — ONDANSETRON HCL 4 MG PO TABS: 4.0000 mg | ORAL_TABLET | Freq: Four times a day (QID) | ORAL | Status: DC | PRN

## 2019-02-24 NOTE — H&P (Addendum)
[facility] [place] NAME: Jacob Simpson    MR#:  [id number]  DATE OF BIRTH:  11-08-1930  DATE OF ADMISSION:  02/24/2019  PRIMARY CARE PHYSICIAN: Cecile Sheerer, MD   REQUESTING/REFERRING PHYSICIAN: Dr. Jimmye Norman  CHIEF COMPLAINT:   Chief Complaint  Patient presents with  . Shortness of Breath    HISTORY OF PRESENT ILLNESS:  Jacob Simpson  is a 85 y.o. male with a known history of HTN, Afib, Severe aortic stenosis, D CHF, CKD3 here with acute onset SOB started at 1.30 AM. Here CXR shows bilateral pulm edema vs infiltrate and needing Bipap support. Will be admitted to stepdown  PAST MEDICAL HISTORY:   Past Medical History:  Diagnosis Date  . A-fib ([place])   . Aortic aneurysm ([place])   . Aortic stenosis   . Asthma   . CHF (congestive heart failure) ([place])   . Hammertoe   . HTN (hypertension)   . TIA (transient ischemic attack)     PAST SURGICAL HISTORY:   Past Surgical History:  Procedure Laterality Date  . BACK SURGERY  12/89  . CATARACT EXTRACTION W/ INTRAOCULAR LENS IMPLANT Right 12/00  . CATARACT EXTRACTION W/ INTRAOCULAR LENS IMPLANT Left 8/01  . foot sugery Right 6/03  . FOOT SURGERY Left 1/96   hammertoe  . KIDNEY STONE SURGERY  7/90  . ROTATOR CUFF REPAIR Right 1/98  . TOTAL KNEE ARTHROPLASTY Bilateral 1987    SOCIAL HISTORY:   Social History   Tobacco Use  . Smoking status: Former Research scientist (life sciences)  . Smokeless tobacco: Never Used  . Tobacco comment: quit 1989  Substance Use Topics  . Alcohol use: No    FAMILY HISTORY:   Family History  Problem Relation Age of Onset  . Heart disease Father     DRUG ALLERGIES:   Allergies  Allergen Reactions  . Penicillins Rash    REVIEW OF SYSTEMS:   Review of Systems  Constitutional: Positive for malaise/fatigue. Negative for chills and fever.  HENT: Negative for sore throat.   Eyes: Negative for blurred vision, double vision and pain.  Respiratory: Positive  for cough and shortness of breath. Negative for hemoptysis and wheezing.   Cardiovascular: Positive for orthopnea. Negative for chest pain, palpitations and leg swelling.  Gastrointestinal: Negative for abdominal pain, constipation, diarrhea, heartburn, nausea and vomiting.  Genitourinary: Negative for dysuria and hematuria.  Musculoskeletal: Negative for back pain and joint pain.  Skin: Negative for rash.  Neurological: Negative for sensory change, speech change, focal weakness and headaches.  Endo/Heme/Allergies: Does not bruise/bleed easily.  Psychiatric/Behavioral: Negative for depression. The patient is not nervous/anxious.     MEDICATIONS AT HOME:   Prior to Admission medications   Medication Sig Start Date End Date Taking? Authorizing Provider  aspirin 325 MG tablet Take 325 mg by mouth daily.    [provider]  ibuprofen (ADVIL,MOTRIN) 200 MG tablet Take 200 mg by mouth as needed for pain.    [provider]  Nutritional Supplements (JUICE PLUS FIBRE PO) Take by mouth. Take two by mouth two times a day    [provider]     VITAL SIGNS:  Blood pressure (!) 161/101, pulse (!) 101, temperature (!) 97.5 F (36.4 C), temperature source Oral, resp. rate 20, height 6\' 1"  (1.854 m), weight 78 kg, SpO2 100 %.  PHYSICAL EXAMINATION:  Physical Exam  GENERAL:  85 y.o.-year-old patient lying in the bed , looks critically ill EYES: Pupils equal,  round, reactive to light and accommodation. No scleral icterus. Extraocular muscles intact.  HEENT: Head atraumatic, normocephalic. Oropharynx and nasopharynx clear. No oropharyngeal erythema, moist oral mucosa  NECK:  Supple, no jugular venous distention. No thyroid enlargement, no tenderness.  LUNGS: Bilateral coarse breath sounds CARDIOVASCULAR: S1, S2 normal. No murmurs, rubs, or gallops.  ABDOMEN: Soft, nontender, nondistended. Bowel sounds present. No organomegaly or mass.  EXTREMITIES: Bilateral pedal edema. No  cyanosis, or clubbing. + 2 pedal & radial pulses b/l.   NEUROLOGIC: Cranial nerves II through XII are intact. No focal Motor or sensory deficits appreciated b/l PSYCHIATRIC: The patient is alert and oriented x 3. Anxious SKIN: No obvious rash, lesion, or ulcer.   LABORATORY PANEL:   CBC Recent Labs  Lab 02/24/19 0833  WBC 17.3*  HGB 13.9  HCT 41.8  PLT 155   ------------------------------------------------------------------------------------------------------------------  Chemistries  Recent Labs  Lab 02/24/19 0833  NA 139  K 4.2  CL 109  CO2 20*  GLUCOSE 237*  BUN 25*  CREATININE 1.30*  CALCIUM 9.0   ------------------------------------------------------------------------------------------------------------------  Cardiac Enzymes Recent Labs  Lab 02/24/19 0833  TROPONINI 0.29*   ------------------------------------------------------------------------------------------------------------------  RADIOLOGY:  Dg Chest Port 1 View  Result Date: 02/24/2019 CLINICAL DATA:  Shortness of Breath EXAM: PORTABLE CHEST 1 VIEW COMPARISON:  None. FINDINGS: There is cardiomegaly with pulmonary venous hypertension. There is diffuse interstitial edema. There are pleural effusions bilaterally. There is airspace consolidation in the left base region. No other airspace consolidation evident. No adenopathy appreciable. There is degenerative change in each shoulder. IMPRESSION: Pulmonary vascular congestion with interstitial edema and bilateral pleural effusions. There is felt to be a degree of congestive heart failure. Suspect pneumonia superimposed in the left base, although opacity in this area may be secondary to alveolar edema. No adenopathy. Electronically Signed   By: Lowella Grip III M.D.   On: 02/24/2019 08:41     IMPRESSION AND PLAN:   * Acute hypoxic resp failure Due to CHF and possible PNA Needs Bipap Critically ill Needs Stepdown/ICU bed Wean to [place] as tolerated  *  Acute on chronic diastolic chf - IV Lasix - Input and Output - Monitor Bun/Cr and Potassium - Echo - ordered -Cardiology follow up after discharge  * Bilateral pneumonia At this time it is unclear if this pneumonia but he does have leucocytosis and CXR findings concerning for PNA. Will start Abx and check procalcitonin  * Severe Aortic stenosis Follows with Dr. Daneil Dolin at [facility].  * CKD3 Stable Monitor while being diuresed  * pAfib ON eliquis  * Elevated troponin Likely demand ischemia No CP Will repeat and check echo   All the records are reviewed and case discussed with ED provider. Management plans discussed with the patient, family and they are in agreement.  CODE STATUS: DNR/DNI  TOTAL CRITICAL CARE TIME TAKING CARE OF THIS PATIENT: 40 minutes.   Leia Alf Jullianna Gabor M.D on 02/24/2019 at 10:13 AM  Between 7am to 6pm - Pager - [phone]  After 6pm go to [email or web address] - password [email or web address] [facility]  Office  [phone]  CC: Primary care physician; Shapely-Quinn, Okey Regal, MD  Note: This dictation was prepared with Dragon dictation along with smaller phrase technology. Any transcriptional errors that result from this process are unintentional.

## 2019-02-24 NOTE — Consult Note (Signed)
ANTICOAGULATION CONSULT NOTE - Initial Consult  Pharmacy Consult for Eliquis Indication: atrial fibrillation  Allergies  Allergen Reactions  . Penicillins Rash    Patient Measurements: Height: 6\' 1"  (185.4 cm) Weight: 172 lb (78 kg) IBW/kg (Calculated) : 79.9   Vital Signs: Temp: 97.5 F (36.4 C) (03/10 0820) Temp Source: Oral (03/10 0820) BP: 147/103 (03/10 1100) Pulse Rate: 94 (03/10 1101)  Labs: Recent Labs    02/24/19 0833  HGB 13.9  HCT 41.8  PLT 155  CREATININE 1.30*  TROPONINI 0.29*    Estimated Creatinine Clearance: 45 mL/min (A) (by C-G formula based on SCr of 1.3 mg/dL (H)).   Medical History: Past Medical History:  Diagnosis Date  . A-fib ([place])   . Aortic aneurysm ([place])   . Aortic stenosis   . Asthma   . CHF (congestive heart failure) ([place])   . Hammertoe   . HTN (hypertension)   . TIA (transient ischemic attack)     Medications:  Patient is on Eliquis 5mg  bid prior to admission for Non-valvular A Fib - last dose per med rec 3/9 @ 1900  Assessment: Pharmacy has been consulted for Apixiban dosing for Non-valvular a fib in this patient.   Goal of Therapy:  Monitor platelets by anticoagulation protocol: Yes   Plan:  Will resume home dose of 5mg  bid, pt has baseline CBC/SCr - will assess at least every 3 days per protocol.  Lu Duffel, PharmD, BCPS Clinical Pharmacist 02/24/2019 12:43 PM

## 2019-02-24 NOTE — ED Notes (Addendum)
ED TO INPATIENT HANDOFF REPORT  ED Nurse Name and Phone #:  Elmo Putt #[phone]  S Name/Age/Gender Jacob Simpson 85 y.o. male Room/Bed: ED10A/ED10A  Code Status   Code Status: DNR  Home/SNF/Other Home Patient oriented to: self, place, time and situation Is this baseline? Yes   Triage Complete: Triage complete  Chief Complaint sob  Triage Note Pt in via [facility] from home with complaints of worsening shortness of breath since 0130 today.  Pt on CPAP upon arrival, A/Ox4.  EDP to bedside.   Allergies Allergies  Allergen Reactions  . Penicillins Rash    Level of Care/Admitting Diagnosis ED Disposition    ED Disposition Condition [facility] Area: [place] [[id number]]  Level of Care: Stepdown [14]  Diagnosis: CHF (congestive heart failure) [facility]) [[id number]]  Admitting Physician: Hillary Bow [[id number]]  Attending Physician: Hillary Bow [[id number]]  Estimated length of stay: past midnight tomorrow  Certification:: I certify this patient will need inpatient services for at least 2 midnights  PT Class (Do Not Modify): Inpatient [101]  PT Acc Code (Do Not Modify): Private [1]       B Medical/Surgery History Past Medical History:  Diagnosis Date  . A-fib ([place])   . Aortic aneurysm ([place])   . Aortic stenosis   . Asthma   . CHF (congestive heart failure) ([place])   . Hammertoe   . HTN (hypertension)   . TIA (transient ischemic attack)    Past Surgical History:  Procedure Laterality Date  . BACK SURGERY  12/89  . CATARACT EXTRACTION W/ INTRAOCULAR LENS IMPLANT Right 12/00  . CATARACT EXTRACTION W/ INTRAOCULAR LENS IMPLANT Left 8/01  . foot sugery Right 6/03  . FOOT SURGERY Left 1/96   hammertoe  . KIDNEY STONE SURGERY  7/90  . ROTATOR CUFF REPAIR Right 1/98  . TOTAL KNEE ARTHROPLASTY Bilateral 1987     A IV Location/Drains/Wounds Patient Lines/Drains/Airways Status   Active Line/Drains/Airways    Name:   Placement date:   Placement  time:   Site:   Days:   Peripheral IV 02/24/19 Right Antecubital   02/24/19    0800    Antecubital   less than 1   Peripheral IV 02/24/19 Left Antecubital   02/24/19    0800    Antecubital   less than 1          Intake/Output Last 24 hours  Intake/Output Summary (Last 24 hours) at 02/24/2019 1108 Last data filed at 02/24/2019 1008 Gross per 24 hour  Intake -  Output 400 ml  Net -400 ml    Labs/Imaging Results for orders placed or performed during the hospital encounter of 02/24/19 (from the past 48 hour(s))  CBC with Differential     Status: Abnormal   Collection Time: 02/24/19  8:33 AM  Result Value Ref Range   WBC 17.3 (H) 4.0 - 10.5 K/uL   RBC 4.33 4.22 - 5.81 MIL/uL   Hemoglobin 13.9 13.0 - 17.0 g/dL   HCT 41.8 39.0 - 52.0 %   MCV 96.5 80.0 - 100.0 fL   MCH 32.1 26.0 - 34.0 pg   MCHC 33.3 30.0 - 36.0 g/dL   RDW 12.7 11.5 - 15.5 %   Platelets 155 150 - 400 K/uL   nRBC 0.0 0.0 - 0.2 %   Neutrophils Relative % 86 %   Neutro Abs 14.8 (H) 1.7 - 7.7 K/uL   Lymphocytes Relative 9 %   Lymphs Abs 1.6 0.7 -  4.0 K/uL   Monocytes Relative 4 %   Monocytes Absolute 0.7 0.1 - 1.0 K/uL   Eosinophils Relative 0 %   Eosinophils Absolute 0.0 0.0 - 0.5 K/uL   Basophils Relative 0 %   Basophils Absolute 0.0 0.0 - 0.1 K/uL   Immature Granulocytes 1 %   Abs Immature Granulocytes 0.11 (H) 0.00 - 0.07 K/uL    Comment: Performed at [facility], [address], [place], [place] [address]  Basic metabolic panel     Status: Abnormal   Collection Time: 02/24/19  8:33 AM  Result Value Ref Range   Sodium 139 135 - 145 mmol/L   Potassium 4.2 3.5 - 5.1 mmol/L   Chloride 109 98 - 111 mmol/L   CO2 20 (L) 22 - 32 mmol/L   Glucose, Bld 237 (H) 70 - 99 mg/dL   BUN 25 (H) 8 - 23 mg/dL   Creatinine, Ser 1.30 (H) 0.61 - 1.24 mg/dL   Calcium 9.0 8.9 - 10.3 mg/dL   GFR calc non Af Amer 49 (L) >60 mL/min   GFR calc Af Amer 57 (L) >60 mL/min   Anion gap 10 5 - 15    Comment: Performed at  [facility], [address] [place], [place] [address]  Brain natriuretic peptide     Status: Abnormal   Collection Time: 02/24/19  8:33 AM  Result Value Ref Range   B Natriuretic Peptide 1,114.0 (H) 0.0 - 100.0 pg/mL    Comment: Performed at [facility], [address] [place], [place] [address]  Blood gas, venous     Status: Abnormal (Preliminary result)   Collection Time: 02/24/19  8:33 AM  Result Value Ref Range   pH, Ven 7.31 7.250 - 7.430   pCO2, Ven 42 (L) 44.0 - 60.0 mmHg   pO2, Ven PENDING 32.0 - 45.0 mmHg   Bicarbonate 21.1 20.0 - 28.0 mmol/L   Acid-base deficit 5.0 (H) 0.0 - 2.0 mmol/L   O2 Saturation 47.9 %   Patient temperature 37.0    Collection site VEIN    Sample type VEIN     Comment: Performed at [facility], [address] [place], [place] [address]  Troponin I - Add-On to previous collection     Status: Abnormal   Collection Time: 02/24/19  8:33 AM  Result Value Ref Range   Troponin I 0.29 (HH) <0.03 ng/mL    Comment: CRITICAL RESULT CALLED TO, READ BACK BY AND VERIFIED WITH ASHLEY SMITH@0945  ON 02/24/19 BY HKP Performed at [facility], [address] [place], [place] [address]   Lactic acid, plasma     Status: None   Collection Time: 02/24/19  9:08 AM  Result Value Ref Range   Lactic Acid, Venous 1.7 0.5 - 1.9 mmol/L    Comment: Performed at [facility], [address] [place], [place] [address]   Dg Chest Port 1 View  Result Date: 02/24/2019 CLINICAL DATA:  Shortness of Breath EXAM: PORTABLE CHEST 1 VIEW COMPARISON:  None. FINDINGS: There is cardiomegaly with pulmonary venous hypertension. There is diffuse interstitial edema. There are pleural effusions bilaterally. There is airspace consolidation in the left base region. No other airspace consolidation evident. No adenopathy appreciable. There is degenerative change in each shoulder. IMPRESSION: Pulmonary vascular congestion with interstitial edema  and bilateral pleural effusions. There is felt to be a degree of congestive heart failure. Suspect pneumonia superimposed in the left base, although opacity in this area may be  secondary to alveolar edema. No adenopathy. Electronically Signed   By: Lowella Grip III M.D.   On: 02/24/2019 08:41    Pending Labs Unresulted Labs (From admission, onward)    Start     Ordered   02/25/19 1610  Basic metabolic panel  Tomorrow morning,   STAT     02/24/19 0939   02/25/19 0500  CBC  Tomorrow morning,   STAT     02/24/19 0939   02/24/19 0943  Procalcitonin - Baseline  Add-on,   AD     02/24/19 0942   02/24/19 0849  Blood culture (routine x 2)  BLOOD CULTURE X 2,   STAT     02/24/19 0850          Vitals/Pain Today's Vitals   02/24/19 0830 02/24/19 0900 02/24/19 0930 02/24/19 1000  BP: (!) 164/103 (!) 157/109 (!) 161/101   Pulse: (!) 106 (!) 102 (!) 101 (!) 101  Resp: (!) 28 (!) 25 (!) 28 20  Temp:      TempSrc:      SpO2: 100% 100% 100% 100%  Weight:      Height:      PainSc:        Isolation Precautions No active isolations  Medications Medications  sodium chloride flush (NS) 0.9 % injection 3 mL (has no administration in time range)  acetaminophen (TYLENOL) tablet 650 mg (has no administration in time range)    Or  acetaminophen (TYLENOL) suppository 650 mg (has no administration in time range)  polyethylene glycol (MIRALAX / GLYCOLAX) packet 17 g (has no administration in time range)  ondansetron (ZOFRAN) tablet 4 mg (has no administration in time range)    Or  ondansetron (ZOFRAN) injection 4 mg (has no administration in time range)  albuterol (PROVENTIL) (2.5 MG/3ML) 0.083% nebulizer solution 2.5 mg (has no administration in time range)  furosemide (LASIX) injection 60 mg (has no administration in time range)  cefTRIAXone (ROCEPHIN) 1 g in sodium chloride 0.9 % 100 mL IVPB (has no administration in time range)  azithromycin (ZITHROMAX) 500 mg in sodium chloride 0.9 % 250  mL IVPB (has no administration in time range)  levofloxacin (LEVAQUIN) IVPB 750 mg (750 mg Intravenous New Bag/Given 02/24/19 0912)  furosemide (LASIX) injection 80 mg (80 mg Intravenous Given 02/24/19 0912)    Mobility Low fall risk  Ambulatory without assistive device at home  Focused Assessments Cardiac Assessment Handoff:  Cardiac Rhythm: Sinus tachycardia Lab Results  Component Value Date   TROPONINI 0.29 (HH) 02/24/2019   No results found for: DDIMER Does the Patient currently have chest pain? No   Cardiac Rhythm: Sinus tachycardia       Pulmonary Assessment Handoff:  Lung sounds: L Breath Sounds: Coarse crackles R Breath Sounds: Coarse crackles O2 Device: Bi-PAP    R Recommendations: See Admitting Provider Note  Report given to: Stacy  Additional Notes:

## 2019-02-24 NOTE — ED Notes (Signed)
Pt taken off BiPAP mask briefly for sip of water and mouth moisturizer. Pt off mask on 6L O2 n/c, sat maintained above 95% however pt did have increased shortness of breath and work of breathing, RR44. Mask replaced.

## 2019-02-24 NOTE — ED Triage Notes (Signed)
Pt in via [facility] from home with complaints of worsening shortness of breath since 0130 today.  Pt on CPAP upon arrival, A/Ox4.  EDP to bedside.

## 2019-02-24 NOTE — ED Provider Notes (Signed)
[facility] Emergency Department Provider Note       Time seen: ----------------------------------------- 8:16 AM on 02/24/2019 ----------------------------------------- Level V caveat: History/ROS limited by respiratory distress  I have reviewed the triage vital signs and the nursing notes.  HISTORY   Chief Complaint Shortness of Breath    HPI Jacob Simpson is a 85 y.o. male with a history of paroxysmal A. fib, depression, thrombocytopenia, B-cell lymphoma who presents to the ED for respiratory distress.  Patient arrives by EMS from home with complaints of worsening shortness of breath since 130 this morning.  Patient reports recently he had pneumonia which felt similarly.  He presents to the hospital on CPAP.  Past Medical History:  Diagnosis Date  . Hammertoe     Patient Active Problem List   Diagnosis Date Noted  . Pain in limb 09/28/2013  . Arthritis of foot, degenerative 09/28/2013    Past Surgical History:  Procedure Laterality Date  . BACK SURGERY  12/89  . CATARACT EXTRACTION W/ INTRAOCULAR LENS IMPLANT Right 12/00  . CATARACT EXTRACTION W/ INTRAOCULAR LENS IMPLANT Left 8/01  . foot sugery Right 6/03  . FOOT SURGERY Left 1/96   hammertoe  . KIDNEY STONE SURGERY  7/90  . ROTATOR CUFF REPAIR Right 1/98  . TOTAL KNEE ARTHROPLASTY Bilateral 1987    Allergies Penicillins  Social History Social History   Tobacco Use  . Smoking status: Former Research scientist (life sciences)  . Smokeless tobacco: Never Used  . Tobacco comment: quit 1989  Substance Use Topics  . Alcohol use: No  . Drug use: No   Review of Systems Respiratory: Positive for difficulty breathing  All systems unknown at this time.  ____________________________________________   PHYSICAL EXAM:  VITAL SIGNS: ED Triage Vitals  Enc Vitals Group     BP      Pulse      Resp      Temp      Temp src      SpO2      Weight      Height      Head Circumference      Peak Flow      Pain  Score      Pain Loc      Pain Edu?      Excl. in [place]?     Constitutional: Alert and oriented.  Mild to moderate distress Eyes: Conjunctivae are normal. Normal extraocular movements. ENT      Head: Normocephalic and atraumatic.      Nose: No congestion/rhinnorhea.      Mouth/Throat: Mucous membranes are moist.      Neck: No stridor. Cardiovascular: Normal rate, regular rhythm. No murmurs, rubs, or gallops. Respiratory: Tachypnea, diffuse rhonchi on the right side Gastrointestinal: Soft and nontender. Normal bowel sounds Musculoskeletal: Nontender with normal range of motion in extremities. No lower extremity tenderness nor edema. Neurologic:  Normal speech and language. No gross focal neurologic deficits are appreciated.  Skin:  Skin is warm, dry and intact. No rash noted. Psychiatric: Mood and affect are normal. Speech and behavior are normal.  ____________________________________________  EKG: Interpreted by me.  Sinus tachycardia with a rate of 111 bpm, possible LVH with repolarization abnormality, ST depressions, normal axis  ____________________________________________  ED COURSE:  As part of my medical decision making, I reviewed the following data within the [place] History obtained from family if available, nursing notes, old chart and ekg, as well as notes from prior ED visits. Patient presented  for shortness of breath, we will assess with labs and imaging as indicated at this time.   Procedures ____________________________________________   LABS (pertinent positives/negatives)  Labs Reviewed  CBC WITH DIFFERENTIAL/PLATELET - Abnormal; Notable for the following components:      Result Value   WBC 17.3 (*)    Neutro Abs 14.8 (*)    Abs Immature Granulocytes 0.11 (*)    All other components within normal limits  BASIC METABOLIC PANEL - Abnormal; Notable for the following components:   CO2 20 (*)    Glucose, Bld 237 (*)    BUN 25 (*)     Creatinine, Ser 1.30 (*)    GFR calc non Af Amer 49 (*)    GFR calc Af Amer 57 (*)    All other components within normal limits  BRAIN NATRIURETIC PEPTIDE - Abnormal; Notable for the following components:   B Natriuretic Peptide 1,114.0 (*)    All other components within normal limits  BLOOD GAS, VENOUS - Abnormal; Notable for the following components:   pCO2, Ven 42 (*)    Acid-base deficit 5.0 (*)    All other components within normal limits  CULTURE, BLOOD (ROUTINE X 2)  CULTURE, BLOOD (ROUTINE X 2)  LACTIC ACID, PLASMA  TROPONIN I   CRITICAL CARE Performed by: Laurence Aly   Total critical care time: 30 minutes  Critical care time was exclusive of separately billable procedures and treating other patients.  Critical care was necessary to treat or prevent imminent or life-threatening deterioration.  Critical care was time spent personally by me on the following activities: development of treatment plan with patient and/or surrogate as well as nursing, discussions with consultants, evaluation of patient's response to treatment, examination of patient, obtaining history from patient or surrogate, ordering and performing treatments and interventions, ordering and review of laboratory studies, ordering and review of radiographic studies, pulse oximetry and re-evaluation of patient's condition.  RADIOLOGY Images were viewed by me  Chest x-ray IMPRESSION: Pulmonary vascular congestion with interstitial edema and bilateral pleural effusions. There is felt to be a degree of congestive heart failure. Suspect pneumonia superimposed in the left base, although opacity in this area may be secondary to alveolar edema. No adenopathy. ____________________________________________   DIFFERENTIAL DIAGNOSIS   CHF, COPD, pneumonia, influenza  FINAL ASSESSMENT AND PLAN  Acute respiratory distress, likely CHF exacerbation, possible pneumonia   Plan: The patient had presented for  respiratory distress. Patient's labs did reveal leukocytosis and likely indicate CHF exacerbation indicating possible pneumonia and CHF. Patient's imaging confirmed the same.  There was pulmonary vascular congestion and edema with possible pneumonia in the left base.  We gave IV Lasix as well as IV Levaquin.  He continues on BiPAP at this time and appears to be improving.  I will discuss with the hospitalist for admission.   Laurence Aly, MD    Note: This note was generated in part or whole with voice recognition software. Voice recognition is usually quite accurate but there are transcription errors that can and very often do occur. I apologize for any typographical errors that were not detected and corrected.     Earleen Newport, MD 02/24/19 [phone]

## 2019-02-24 NOTE — Consult Note (Signed)
Name: Jacob Simpson MRN: [id number] DOB: 1930-02-27    ADMISSION DATE:  02/24/2019 CONSULTATION DATE:  02/24/2019  REFERRING MD :  Dr. Darvin Neighbours  CHIEF COMPLAINT:  Shortness of Breath  BRIEF PATIENT DESCRIPTION:  85 year old male, DNR/DNI, admitted with acute hypoxic respiratory failure secondary to CHF exacerbation and questionable HCAP, initially requiring BiPAP.  SIGNIFICANT EVENTS  02/24/2019>> admission to [facility] stepdown  STUDIES:  Echocardiogram 3/11>>  CULTURES: Blood 3/10 x2>> Sputum 3/10>> Strep Pneumo urinary antigen 3/10>> Legionella Urinary antigen 3/10>> MRSA PCR 3/10>>Negative  ANTIBIOTICS: Levaquin 3/10>>  HISTORY OF PRESENT ILLNESS:   Jacob Simpson is a 85 year old male, DNR/DNI, with a past medical history notable for atrial fibrillation, aortic stenosis, asthma, CHF, hypertension, TIA who presented to [facility] ED on 02/24/2019 with complaints of shortness of breath.  He reports shortness of breath that began around 1:30 in the morning 3/10 progressively worsened prompting him to seek medical care at the ED. Of note the patient reports he was recently treated at [facility] for pneumonia approximately 7 to 8 days ago. In the ED, given his respiratory distress he was placed on BiPAP.  Initial work-up in the ED revealed BNP 1114, troponin 0.29, lactic acid 1.7, WBC 17.3, procalcitonin less than 0.10, and creatinine 1.3.  Chest x-ray is concerning for pulmonary edema and bilateral pleural effusions, with questionable superimposed pneumonia at the left base.  He is admitted to [facility] stepdown unit for treatment of acute hypoxic respiratory failure secondary to CHF exacerbation and questionable healthcare associated pneumonia, initially requiring BiPAP.  PCCM is consulted for further management.  PAST MEDICAL HISTORY :   has a past medical history of A-fib [facility]), Aortic aneurysm ([place]), Aortic stenosis, Asthma, CHF (congestive heart failure) ([place]), Hammertoe, HTN (hypertension), and TIA  (transient ischemic attack).  has a past surgical history that includes Total knee arthroplasty (Bilateral, 1987); Back surgery (12/89); Kidney stone surgery (7/90); Foot surgery (Left, 1/96); Rotator cuff repair (Right, 1/98); foot sugery (Right, 6/03); Cataract extraction w/ intraocular lens implant (Right, 12/00); and Cataract extraction w/ intraocular lens implant (Left, 8/01). Prior to Admission medications   Medication Sig Start Date End Date Taking? Authorizing Provider  acetaminophen (TYLENOL) 500 MG tablet Take 1,000 mg by mouth every 6 (six) hours as needed for mild pain, moderate pain or fever.   Yes [provider]  apixaban (ELIQUIS) 5 MG TABS tablet Take 5 mg by mouth 2 (two) times daily.   Yes [provider]  aspirin 81 MG tablet Take 81 mg by mouth daily.    Yes [provider]  atorvastatin (LIPITOR) 40 MG tablet Take 40 mg by mouth daily.   Yes [provider]  azelastine (ASTELIN) 0.1 % nasal spray Place 1 spray into both nostrils 2 (two) times daily as needed for rhinitis or allergies. Use in each nostril as directed   Yes [provider]  benzonatate (TESSALON) 200 MG capsule Take 200 mg by mouth 3 (three) times daily as needed for cough.   Yes [provider]  cetirizine (ZYRTEC) 10 MG tablet Take 10 mg by mouth daily as needed for allergies.   Yes [provider]  famotidine (PEPCID) 10 MG tablet Take 10 mg by mouth daily as needed for heartburn or indigestion.   Yes [provider]  fluticasone (FLOVENT HFA) 110 MCG/ACT inhaler Inhale 1 puff into the lungs 2 (two) times daily.   Yes [provider]  guaiFENesin (MUCINEX) 600 MG 12 hr tablet Take 600 mg by mouth  2 (two) times daily as needed for cough or to loosen phlegm.   Yes [provider]  levalbuterol (XOPENEX HFA) 45 MCG/ACT inhaler Inhale 2 puffs into the lungs every 4 (four) hours as needed for wheezing or shortness of breath.    Yes [provider]  meclizine (ANTIVERT) 12.5 MG tablet Take 12.5 mg by mouth 3 (three) times daily as needed for dizziness.   Yes [provider]  metoprolol succinate (TOPROL-XL) 50 MG 24 hr tablet Take 50 mg by mouth daily. Take with or immediately following a meal.   Yes [provider]  mirtazapine (REMERON) 7.5 MG tablet Take 7.5 mg by mouth at bedtime.   Yes [provider]  multivitamin-iron-minerals-folic acid (CENTRUM) chewable tablet Chew 1 tablet by mouth daily.   Yes [provider]  omeprazole (PRILOSEC) 20 MG capsule Take 20 mg by mouth daily as needed (stomach acid).   Yes [provider]  ondansetron (ZOFRAN-ODT) 8 MG disintegrating tablet Take 8 mg by mouth every 8 (eight) hours as needed for nausea or vomiting.   Yes [provider]   Allergies  Allergen Reactions  . Penicillins Rash    Did it involve swelling of the face/tongue/throat, SOB, or low BP? Unknown Did it involve sudden or severe rash/hives, skin peeling, or any reaction on the inside of your mouth or nose? Unknown Did you need to seek medical attention at a hospital or doctor's office? Unknown When did it last happen?Unknown If all above answers are "NO", may proceed with cephalosporin use.     FAMILY HISTORY:  family history includes Heart disease in his father. SOCIAL HISTORY:  reports that he has quit smoking. He has never used smokeless tobacco. He reports that he does not drink alcohol or use drugs.  REVIEW OF SYSTEMS:  Positives in BOLD: Pt denies all complaints Constitutional: Negative for fever, chills, weight loss, malaise/fatigue and diaphoresis.  HENT: Negative for hearing loss, ear pain, nosebleeds, congestion, sore throat, neck pain, tinnitus and ear discharge.   Eyes: Negative for blurred vision, double vision, photophobia, pain, discharge and redness.  Respiratory: Negative for cough, hemoptysis, sputum production,  shortness of breath, wheezing and stridor.   Cardiovascular: Negative for chest pain, palpitations, orthopnea, claudication, leg swelling and PND.  Gastrointestinal: Negative for heartburn, nausea, vomiting, abdominal pain, diarrhea, constipation, blood in stool and melena.  Genitourinary: Negative for dysuria, urgency, frequency, hematuria and flank pain.  Musculoskeletal: Negative for myalgias, back pain, joint pain and falls.  Skin: Negative for itching and rash.  Neurological: Negative for dizziness, tingling, tremors, sensory change, speech change, focal weakness, seizures, loss of consciousness, weakness and headaches.  Endo/Heme/Allergies: Negative for environmental allergies and polydipsia. Does not bruise/bleed easily.  SUBJECTIVE:  Pt denies shortness of breath, states his breathing has improved Denies chest pain, fever/chills, dizziness Reports LE edema which is not new for him  Weaned off BiPAP to HFNC  VITAL SIGNS: Temp:  [97.5 F (36.4 C)-98.4 F (36.9 C)] 97.5 F (36.4 C) (03/10 2000) Pulse Rate:  [90-115] 97 (03/10 2000) Resp:  [17-29] 25 (03/10 2000) BP: (132-168)/(89-109) 135/101 (03/10 2000) SpO2:  [99 %-100 %] 99 % (03/10 2000) Weight:  [78 kg-86.3 kg] 86.3 kg (03/10 1527)  PHYSICAL EXAMINATION: General: Acutely ill appearing male, sitting in bed, on high flow nasal cannula, in no acute distress Neuro: Awake, alert and oriented, follows commands, no focal deficits, speech clear HEENT: Atraumatic, normocephalic, neck supple, no JVD Cardiovascular: RRR, S1-S2, murmur Lungs: Coarse crackles  upon auscultation bilaterally, even, nonlabored, no accessory muscle use, normal effort Abdomen: Soft, nontender, nondistended, no guarding or rebound tenderness, bowel sounds positive x4 Musculoskeletal: Normal bulk and tone, no deformities, 3+ edema bilateral lower extremities Skin: Warm and dry, no obvious rashes lesions or ulcerations  Recent Labs  Lab 02/24/19 0833  NA  139  K 4.2  CL 109  CO2 20*  BUN 25*  CREATININE 1.30*  GLUCOSE 237*   Recent Labs  Lab 02/24/19 0833  HGB 13.9  HCT 41.8  WBC 17.3*  PLT 155   Dg Chest Port 1 View  Result Date: 02/24/2019 CLINICAL DATA:  Shortness of Breath EXAM: PORTABLE CHEST 1 VIEW COMPARISON:  None. FINDINGS: There is cardiomegaly with pulmonary venous hypertension. There is diffuse interstitial edema. There are pleural effusions bilaterally. There is airspace consolidation in the left base region. No other airspace consolidation evident. No adenopathy appreciable. There is degenerative change in each shoulder. IMPRESSION: Pulmonary vascular congestion with interstitial edema and bilateral pleural effusions. There is felt to be a degree of congestive heart failure. Suspect pneumonia superimposed in the left base, although opacity in this area may be secondary to alveolar edema. No adenopathy. Electronically Signed   By: Lowella Grip III M.D.   On: 02/24/2019 08:41    ASSESSMENT / PLAN:  Acute hypoxic respiratory failure secondary to acute on chronic decompensated Diastolic CHF and questionable HCAP -Supplemental O2 as needed to maintain O2 sats greater than 92% -BiPAP, wean as tolerated -Follow intermittent chest x-ray and ABG -Continue IV Lasix -Monitor fever curve -Trend WBCs and procalcitonin -Follow blood and sputum cultures -Continue Levaquin  -PRN bronchodilators  Acute on chronic decompensated diastolic CHF Mildly elevated troponin, likely demand ischemia Hx: A. Fib, severe aortic stenosis -Cardiac monitoring -Maintain MAP>65 -Echocardiogram pending -Trend BNP -Continue scheduled IV Lasix -Monitor renal function with diuresis -Trend troponin -Continue Eliquis  Chronic kidney disease stage III -Monitor I&O's / urinary output -Follow BMP -Ensure adequate renal perfusion -Avoid nephrotoxic agents as able -Replace electrolytes as indicated  Hyperglycemia -CBGs -SSI -Follow ICU  hypo/hyperglycemia protocol    Disposition: Stepdown Goals of care: DNR/DNI VTE prophylaxis: Eliquis Updates: Updated patient at bedside 02/24/2019  Darel Hong, [facility] Pager: [phone] Cell: [phone]  02/24/2019, 10:17 PM

## 2019-02-24 NOTE — ED Notes (Signed)
Spoke to pharmacy regarding IV rocephin and zithromax orders. Per pharmacy, pt can wait until tomorrow/tonight for these antibiotics because pt is covered by levaquin that has already been given. Pharmacy state that when pt is transferred to IP unit they will change times meds are due.

## 2019-02-25 ENCOUNTER — Inpatient Hospital Stay: Payer: Medicare Other

## 2019-02-25 ENCOUNTER — Inpatient Hospital Stay (HOSPITAL_COMMUNITY)
Admit: 2019-02-25 | Discharge: 2019-02-25 | Disposition: A | Discharge status: Home / Self Care | Payer: Medicare Other | Attending: Internal Medicine | Admitting: Internal Medicine

## 2019-02-25 DIAGNOSIS — I5031 Acute diastolic (congestive) heart failure: Secondary | ICD-10-CM

## 2019-02-25 LAB — CBC
HEMATOCRIT: 38.9 % — AB (ref 39.0–52.0)
Hemoglobin: 13.1 g/dL (ref 13.0–17.0)
MCH: 32.3 pg (ref 26.0–34.0)
MCHC: 33.7 g/dL (ref 30.0–36.0)
MCV: 96 fL (ref 80.0–100.0)
Platelets: 139 10*3/uL — ABNORMAL LOW (ref 150–400)
RBC: 4.05 MIL/uL — ABNORMAL LOW (ref 4.22–5.81)
RDW: 12.5 % (ref 11.5–15.5)
WBC: 16.5 10*3/uL — ABNORMAL HIGH (ref 4.0–10.5)
nRBC: 0 % (ref 0.0–0.2)

## 2019-02-25 LAB — BRAIN NATRIURETIC PEPTIDE: B Natriuretic Peptide: 873 pg/mL — ABNORMAL HIGH (ref 0.0–100.0)

## 2019-02-25 LAB — GLUCOSE, CAPILLARY
GLUCOSE-CAPILLARY: 135 mg/dL — AB (ref 70–99)
Glucose-Capillary: 100 mg/dL — ABNORMAL HIGH (ref 70–99)
Glucose-Capillary: 120 mg/dL — ABNORMAL HIGH (ref 70–99)
Glucose-Capillary: 166 mg/dL — ABNORMAL HIGH (ref 70–99)
Glucose-Capillary: 189 mg/dL — ABNORMAL HIGH (ref 70–99)
Glucose-Capillary: 257 mg/dL — ABNORMAL HIGH (ref 70–99)

## 2019-02-25 LAB — BASIC METABOLIC PANEL
Anion gap: 10 (ref 5–15)
BUN: 30 mg/dL — ABNORMAL HIGH (ref 8–23)
CO2: 24 mmol/L (ref 22–32)
Calcium: 8.8 mg/dL — ABNORMAL LOW (ref 8.9–10.3)
Chloride: 106 mmol/L (ref 98–111)
Creatinine, Ser: 1.53 mg/dL — ABNORMAL HIGH (ref 0.61–1.24)
GFR calc Af Amer: 47 mL/min — ABNORMAL LOW (ref >60)
GFR calc non Af Amer: 41 mL/min — ABNORMAL LOW (ref >60)
Glucose, Bld: 164 mg/dL — ABNORMAL HIGH (ref 70–99)
Potassium: 4.5 mmol/L (ref 3.5–5.1)
Sodium: 140 mmol/L (ref 135–145)

## 2019-02-25 LAB — PROCALCITONIN: Procalcitonin: <0.1 ng/mL

## 2019-02-25 LAB — BLOOD GAS, VENOUS
Acid-base deficit: 5 mmol/L — ABNORMAL HIGH (ref 0.0–2.0)
Bicarbonate: 21.1 mmol/L (ref 20.0–28.0)
O2 Saturation: 47.9 %
Patient temperature: 37
pCO2, Ven: 42 mmHg — ABNORMAL LOW (ref 44.0–60.0)
pH, Ven: 7.31 (ref 7.250–7.430)

## 2019-02-25 LAB — EXPECTORATED SPUTUM ASSESSMENT W REFEX TO RESP CULTURE

## 2019-02-25 LAB — TROPONIN I
Troponin I: 0.32 ng/mL (ref <0.03)
Troponin I: 0.21 ng/mL (ref <0.03)

## 2019-02-25 LAB — HEMOGLOBIN A1C
Hgb A1c MFr Bld: 6.7 % — ABNORMAL HIGH (ref 4.8–5.6)
Mean Plasma Glucose: 145.59 mg/dL

## 2019-02-25 LAB — ECHOCARDIOGRAM COMPLETE
Height: 72 in
Weight: 2959.46 oz

## 2019-02-25 LAB — EXPECTORATED SPUTUM ASSESSMENT W GRAM STAIN, RFLX TO RESP C: Special Requests: NORMAL

## 2019-02-25 LAB — STREP PNEUMONIAE URINARY ANTIGEN: Strep Pneumo Urinary Antigen: NEGATIVE

## 2019-02-25 IMAGING — DX PORTABLE CHEST - 1 VIEW
1 series · 1 of 1 positions shown · non-contrast
Comparison: Yesterday

CLINICAL DATA: Respiratory failure

EXAM:
PORTABLE CHEST 1 VIEW

[chest ap]
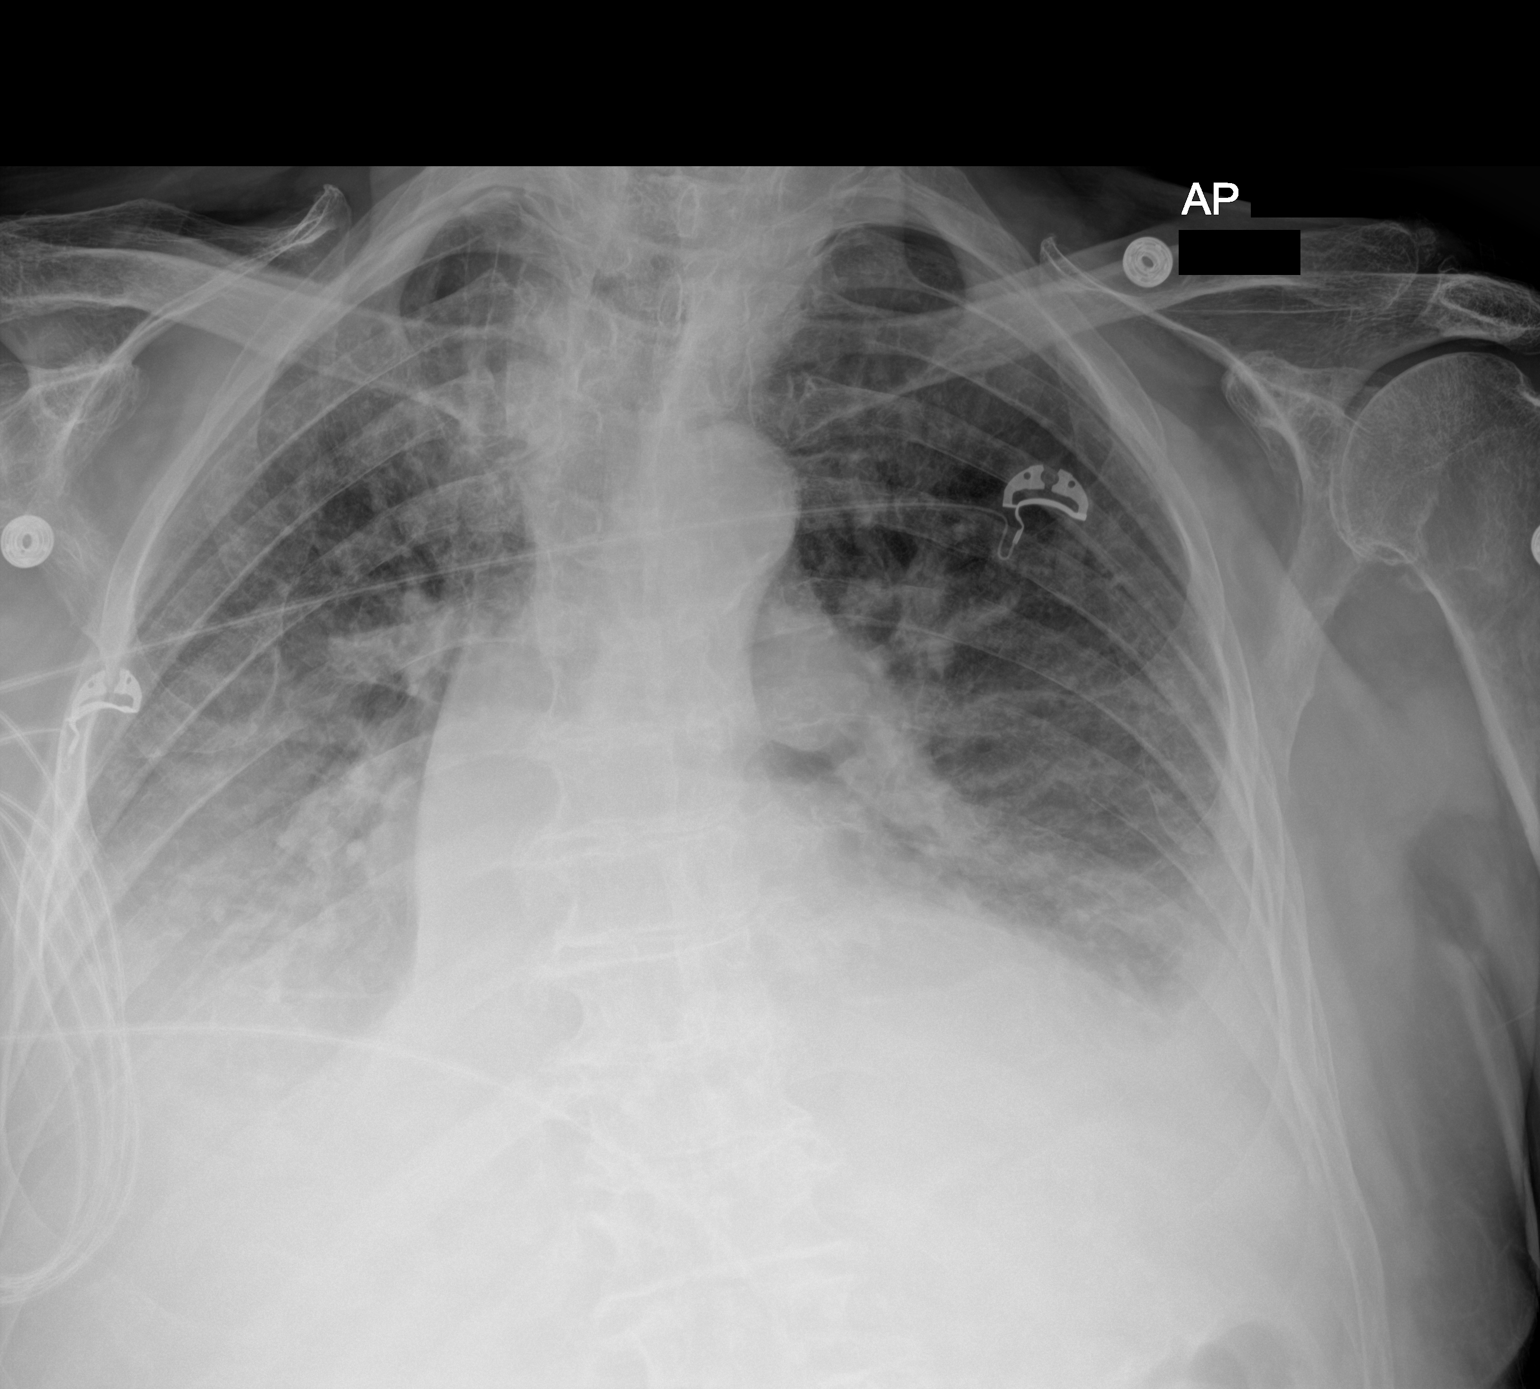

[1 of 1 positions shown; findings below may reference images not displayed]

FINDINGS: Increased hazy density of the bilateral chest attributed to layering
pleural effusions. There is superimposed interstitial
opacity/vascular congestion. Borderline heart size, partially
obscured. No pneumothorax.
IMPRESSION: 1. Increased layering of pleural effusions, likely moderate volume.
2. Pulmonary edema.

## 2019-02-25 MED ORDER — LEVOFLOXACIN IN D5W 750 MG/150ML IV SOLN
750.0000 mg | INTRAVENOUS | Status: DC
  Administered 2019-02-25 – 2019-02-27 (×2): 750 mg via INTRAVENOUS
  Filled 2019-02-25 (×2): qty 150

## 2019-02-25 MED ORDER — METHYLPREDNISOLONE SODIUM SUCC 125 MG IJ SOLR
60.0000 mg | INTRAMUSCULAR | Status: DC
  Administered 2019-02-25 – 2019-02-26 (×2): 60 mg via INTRAVENOUS
  Filled 2019-02-25 (×2): qty 2

## 2019-02-25 MED ORDER — INSULIN ASPART 100 UNIT/ML ~~LOC~~ SOLN
0.0000 [IU] | Freq: Every day | SUBCUTANEOUS | Status: DC
  Administered 2019-02-25 – 2019-02-28 (×3): 3 [IU] via SUBCUTANEOUS
  Filled 2019-02-25 (×3): qty 1

## 2019-02-25 MED ORDER — APIXABAN 2.5 MG PO TABS
2.5000 mg | ORAL_TABLET | Freq: Two times a day (BID) | ORAL | Status: DC
  Administered 2019-02-25 – 2019-02-28 (×7): 2.5 mg via ORAL
  Filled 2019-02-25 (×7): qty 1

## 2019-02-25 MED ORDER — GUAIFENESIN-CODEINE 100-10 MG/5ML PO SOLN
5.0000 mL | Freq: Four times a day (QID) | ORAL | Status: DC | PRN
  Administered 2019-02-25: 5 mL via ORAL
  Filled 2019-02-25: qty 5

## 2019-02-25 MED ORDER — INSULIN ASPART 100 UNIT/ML ~~LOC~~ SOLN
0.0000 [IU] | Freq: Three times a day (TID) | SUBCUTANEOUS | Status: DC
  Administered 2019-02-25: 2 [IU] via SUBCUTANEOUS
  Administered 2019-02-26: 3 [IU] via SUBCUTANEOUS
  Administered 2019-02-26: 1 [IU] via SUBCUTANEOUS
  Administered 2019-02-27: 3 [IU] via SUBCUTANEOUS
  Administered 2019-02-27: 2 [IU] via SUBCUTANEOUS
  Administered 2019-02-27 – 2019-02-28 (×3): 5 [IU] via SUBCUTANEOUS
  Administered 2019-03-01: 1 [IU] via SUBCUTANEOUS
  Filled 2019-02-25 (×9): qty 1

## 2019-02-25 NOTE — Progress Notes (Signed)
Pharmacy Antibiotic Note  Jacob Simpson is a 85 y.o. male admitted on 02/24/2019 with pneumonia.  Pharmacy has been consulted for levaquin dosing.  Plan: Will start levaquin 750 mg IV q48h per CrCl 20 - 49 ml/min  EKG QTc 462 WNL  Height: 6' (182.9 cm) Weight: 190 lb 4.1 oz (86.3 kg) IBW/kg (Calculated) : 77.6  Temp (24hrs), Avg:97.8 F (36.6 C), Min:97.5 F (36.4 C), Max:98.4 F (36.9 C)  Recent Labs  Lab 02/24/19 0833 02/24/19 0908  WBC 17.3*  --   CREATININE 1.30*  --   LATICACIDVEN  --  1.7    Estimated Creatinine Clearance: 44.8 mL/min (A) (by C-G formula based on SCr of 1.3 mg/dL (H)).    Allergies  Allergen Reactions  . Penicillins Rash    Did it involve swelling of the face/tongue/throat, SOB, or low BP? Unknown Did it involve sudden or severe rash/hives, skin peeling, or any reaction on the inside of your mouth or nose? Unknown Did you need to seek medical attention at a hospital or doctor's office? Unknown When did it last happen?Unknown If all above answers are "NO", may proceed with cephalosporin use.     Thank you for allowing pharmacy to be a part of this patient's care.  Tobie Lords, PharmD, BCPS Clinical Pharmacist 02/25/2019

## 2019-02-25 NOTE — Progress Notes (Signed)
Pt transferred from ICU. Pt A&O with HFNC at 8. Telemetry monitor applied and called to CCMD. No complaints of pain at this time.

## 2019-02-25 NOTE — Progress Notes (Signed)
Pt transitioned to HFNC overnight without complication. External condom cath placed for patient comfort overnight. Early this morning pt stated he felt "tight" and his UO has decreased. NP ordered and in and out cath d/t bladder scan of 487. Pt has orders to transfer to the floor.

## 2019-02-25 NOTE — Progress Notes (Signed)
[place] [facility] [place] NAME: Jacob Simpson    MR#:  [id number]  DATE OF BIRTH:  11/11/1930  SUBJECTIVE:  CHIEF COMPLAINT:   Chief Complaint  Patient presents with  . Shortness of Breath   Still has SOB. Sitting in a chair.  On 6 L oxygen.  Hearing loss  REVIEW OF SYSTEMS:    Review of Systems  Constitutional: Positive for malaise/fatigue. Negative for chills, fever and weight loss.  HENT: Negative for hearing loss and nosebleeds.   Eyes: Negative for blurred vision, double vision and pain.  Respiratory: Positive for cough, sputum production and shortness of breath. Negative for hemoptysis and wheezing.   Cardiovascular: Negative for chest pain, palpitations, orthopnea and leg swelling.  Gastrointestinal: Negative for abdominal pain, constipation, diarrhea, nausea and vomiting.  Genitourinary: Negative for dysuria and hematuria.  Musculoskeletal: Negative for back pain, falls and myalgias.  Skin: Negative for rash.  Neurological: Negative for dizziness, tremors, sensory change, speech change, focal weakness, seizures and headaches.  Endo/Heme/Allergies: Does not bruise/bleed easily.  Psychiatric/Behavioral: Negative for depression and memory loss. The patient is not nervous/anxious.     DRUG ALLERGIES:   Allergies  Allergen Reactions  . Penicillins Rash    Did it involve swelling of the face/tongue/throat, SOB, or low BP? Unknown Did it involve sudden or severe rash/hives, skin peeling, or any reaction on the inside of your mouth or nose? Unknown Did you need to seek medical attention at a hospital or doctor's office? Unknown When did it last happen?Unknown If all above answers are "NO", may proceed with cephalosporin use.     VITALS:  Blood pressure (!) 156/101, pulse 99, temperature 98.1 F (36.7 C), temperature source Oral, resp. rate 19, height 6' (1.829 m), weight 83.9 kg, SpO2 100 %.  PHYSICAL EXAMINATION:    Physical Exam  GENERAL:  85 y.o.-year-old patient lying in the bed with conversational dyspnea EYES: Pupils equal, round, reactive to light and accommodation. No scleral icterus. Extraocular muscles intact.  HEENT: Head atraumatic, normocephalic. Oropharynx and nasopharynx clear.  NECK:  Supple, no jugular venous distention. No thyroid enlargement, no tenderness.  LUNGS: Bilateral wheezing and crackles CARDIOVASCULAR: S1, S2 normal. No murmurs, rubs, or gallops.  ABDOMEN: Soft, nontender, nondistended. Bowel sounds present. No organomegaly or mass.  EXTREMITIES: No cyanosis, clubbing or edema b/l.    NEUROLOGIC: Cranial nerves II through XII are intact. No focal Motor or sensory deficits b/l.   PSYCHIATRIC: The patient is alert and oriented x 3.  SKIN: No obvious rash, lesion, or ulcer.   LABORATORY PANEL:   CBC Recent Labs  Lab 02/25/19 0116  WBC 16.5*  HGB 13.1  HCT 38.9*  PLT 139*   ------------------------------------------------------------------------------------------------------------------ Chemistries  Recent Labs  Lab 02/25/19 0116  NA 140  K 4.5  CL 106  CO2 24  GLUCOSE 164*  BUN 30*  CREATININE 1.53*  CALCIUM 8.8*   ------------------------------------------------------------------------------------------------------------------  Cardiac Enzymes Recent Labs  Lab 02/25/19 0711  TROPONINI 0.21*   ------------------------------------------------------------------------------------------------------------------  RADIOLOGY:  Dg Chest Port 1 View  Result Date: 02/25/2019 CLINICAL DATA:  Respiratory failure EXAM: PORTABLE CHEST 1 VIEW COMPARISON:  Yesterday FINDINGS: Increased hazy density of the bilateral chest attributed to layering pleural effusions. There is superimposed interstitial opacity/vascular congestion. Borderline heart size, partially obscured. No pneumothorax. IMPRESSION: 1. Increased layering of pleural effusions, likely moderate volume. 2.  Pulmonary edema. Electronically Signed   By: Monte Fantasia M.D.   On: 02/25/2019 06:56  Dg Chest Port 1 View  Result Date: 02/24/2019 CLINICAL DATA:  Shortness of Breath EXAM: PORTABLE CHEST 1 VIEW COMPARISON:  None. FINDINGS: There is cardiomegaly with pulmonary venous hypertension. There is diffuse interstitial edema. There are pleural effusions bilaterally. There is airspace consolidation in the left base region. No other airspace consolidation evident. No adenopathy appreciable. There is degenerative change in each shoulder. IMPRESSION: Pulmonary vascular congestion with interstitial edema and bilateral pleural effusions. There is felt to be a degree of congestive heart failure. Suspect pneumonia superimposed in the left base, although opacity in this area may be secondary to alveolar edema. No adenopathy. Electronically Signed   By: Lowella Grip III M.D.   On: 02/24/2019 08:41     ASSESSMENT AND PLAN:   * Acute hypoxic resp failure Due to CHF and PNA Off bipap today  * Acute on chronic diastolic chf - IV Lasix - Input and Output - Monitor Bun/Cr and Potassium - Echo - ordered- pending -Cardiology follow up after discharge  * Bilateral pneumonia Continue levaquin Leucocytosis present Will add steroids  * Severe Aortic stenosis Follows with Dr. Daneil Dolin at [facility].  * CKD3 Stable Monitor while being diuresed  * pAfib On eliquis  * Elevated troponin Repeated and stable Due to demand ischemia  All the records are reviewed and case discussed with Care Management/Social Workerr. Management plans discussed with the patient, family and they are in agreement.  CODE STATUS: DNR/DNI  DVT Prophylaxis: SCDs  TOTAL TIME TAKING CARE OF THIS PATIENT: 35 minutes.   POSSIBLE D/C IN 2-3 DAYS, DEPENDING ON CLINICAL CONDITION.  Leia Alf See Beharry M.D on 02/25/2019 at 1:45 PM  Between 7am to 6pm - Pager - [phone]  After 6pm go to [email or web address] - password [email or web address]  [facility]  Office  [phone]  CC: Primary care physician; Shapely-Quinn, Okey Regal, MD  Note: This dictation was prepared with Dragon dictation along with smaller phrase technology. Any transcriptional errors that result from this process are unintentional.

## 2019-02-25 NOTE — Progress Notes (Signed)
*  PRELIMINARY RESULTS* Echocardiogram 2D Echocardiogram has been performed.  Sherrie Sport 02/25/2019, 9:11 AM

## 2019-02-25 NOTE — Progress Notes (Signed)
Notify Dr. Aliene Altes about patient's episode of bleeding in his penis, was a small amount. Patient takes eliquis but also had foley yesterday from ICU probably trauma from the foley, no orders given. RN will continue to monitor.

## 2019-02-26 LAB — PROCALCITONIN: Procalcitonin: <0.1 ng/mL

## 2019-02-26 LAB — BASIC METABOLIC PANEL
Anion gap: 10 (ref 5–15)
BUN: 44 mg/dL — ABNORMAL HIGH (ref 8–23)
CO2: 22 mmol/L (ref 22–32)
Calcium: 8.7 mg/dL — ABNORMAL LOW (ref 8.9–10.3)
Chloride: 107 mmol/L (ref 98–111)
Creatinine, Ser: 1.61 mg/dL — ABNORMAL HIGH (ref 0.61–1.24)
GFR calc Af Amer: 44 mL/min — ABNORMAL LOW (ref >60)
GFR calc non Af Amer: 38 mL/min — ABNORMAL LOW (ref >60)
Glucose, Bld: 187 mg/dL — ABNORMAL HIGH (ref 70–99)
Potassium: 4.2 mmol/L (ref 3.5–5.1)
Sodium: 139 mmol/L (ref 135–145)

## 2019-02-26 LAB — GLUCOSE, CAPILLARY
Glucose-Capillary: 159 mg/dL — ABNORMAL HIGH (ref 70–99)
Glucose-Capillary: 234 mg/dL — ABNORMAL HIGH (ref 70–99)
Glucose-Capillary: 140 mg/dL — ABNORMAL HIGH (ref 70–99)
Glucose-Capillary: 267 mg/dL — ABNORMAL HIGH (ref 70–99)

## 2019-02-26 LAB — LEGIONELLA PNEUMOPHILA SEROGP 1 UR AG: L. pneumophila Serogp 1 Ur Ag: NEGATIVE

## 2019-02-26 MED ORDER — FUROSEMIDE 40 MG PO TABS
40.0000 mg | ORAL_TABLET | Freq: Every day | ORAL | Status: DC
  Administered 2019-02-27: 40 mg via ORAL
  Filled 2019-02-26: qty 1

## 2019-02-26 MED ORDER — PREDNISONE 20 MG PO TABS
20.0000 mg | ORAL_TABLET | Freq: Every day | ORAL | Status: DC
  Administered 2019-02-27 – 2019-02-28 (×2): 20 mg via ORAL
  Filled 2019-02-26 (×2): qty 1

## 2019-02-26 NOTE — TOC Initial Note (Signed)
Transition of Care [facility]) - Initial/Assessment Note    Patient Details  Name: Jacob Simpson MRN: [id number] Date of Birth: 01/27/1930  Transition [facility] CM/SW Contact:    Elza Rafter, RN Phone Number: 02/26/2019, 11:44 AM  Clinical Narrative:     Patient is from home alone.  He has 4 daughter's that look after him.  He is open with [facility] for RN, PT.  Notified Tommi Rumps that patient was admitted.  Will notify Tommi Rumps at [place].  Patient has a functioning scale at home and this RN explained importance of daily weights.  He was not weighing daily prior to admission.  He is on oxygen 2L acute.  Will try to wean.  Has a walker and a cane if needed at home.  He is Current with his PCP and obtains medications without difficulty.  Heart Failure Clinic appointment scheduled.  Patient is going to [place] for cardiology but will be switching to the [place] area.              Expected Discharge Plan: [place] Barriers to Discharge: Continued Medical Work up   Patient Goals and CMS Choice Patient states their goals for this hospitalization and ongoing recovery are:: Go home and resume HH with [facility] CMS Medicare.gov Compare Post Acute Care list provided to:: Patient Choice offered to / list presented to : Patient, Adult Children  Expected Discharge Plan and Services Expected Discharge Plan: [place] Discharge Planning Services: CM Consult, HF Clinic Post Acute Care Choice: [place] arrangements for the past 2 months: Single Family Home                     HH Arranged: RN, PT [place] Agency: [place]  Prior Living Arrangements/Services Living arrangements for the past 2 months: Single Family Home Lives with:: Self   Do you feel safe going back to the place where you live?: Yes        Care giver support system in place?: Yes (comment) Current home services: Home PT, Home RN Criminal Activity/Legal Involvement Pertinent to Current  Situation/Hospitalization: No - Comment as needed  Activities of Daily Living Home Assistive Devices/Equipment: None ADL Screening (condition at time of admission) Patient's cognitive ability adequate to safely complete daily activities?: Yes Is the patient deaf or have difficulty hearing?: No Does the patient have difficulty seeing, even when wearing glasses/contacts?: No Does the patient have difficulty concentrating, remembering, or making decisions?: No Patient able to express need for assistance with ADLs?: Yes Does the patient have difficulty dressing or bathing?: No Independently performs ADLs?: Yes (appropriate for developmental age) Does the patient have difficulty walking or climbing stairs?: No Weakness of Legs: None Weakness of Arms/Hands: None  Permission Sought/Granted                  Emotional Assessment Appearance:: Appears stated age, Well-Groomed Attitude/Demeanor/Rapport: Gracious Affect (typically observed): Accepting Orientation: : Oriented to Self, Oriented to Place, Oriented to  Time, Oriented to Situation Alcohol / Substance Use: Not Applicable    Admission diagnosis:  Acute respiratory distress [R06.03] Dyspnea [R06.00] Hypoxia [R09.02] Community acquired pneumonia of left lower lobe of lung ([place]) [J18.1] Patient Active Problem List   Diagnosis Date Noted  . CHF (congestive heart failure) ([place]) 02/24/2019  . Pain in limb 09/28/2013  . Arthritis of foot, degenerative 09/28/2013   PCP:  Cecile Sheerer, MD Pharmacy:   [facility] #  [place], [address] AT [address] [place] [address] Phone: [phone] Fax: [phone]     Social Determinants of Health (SDOH) Interventions    Readmission Risk Interventions 30 Day Unplanned Readmission Risk Score     ED to Hosp-Admission (Current) from 02/24/2019 in [place] (2A)  30 Day  Unplanned Readmission Risk Score (%)  17 Filed at 02/26/2019 0800     This score is the patient's risk of an unplanned readmission within 30 days of being discharged (0 -100%). The score is based on dignosis, age, lab data, medications, orders, and past utilization.   Low:  0-14.9   Medium: 15-21.9   High: 22-29.9   Extreme: 30 and above       No flowsheet data found.

## 2019-02-26 NOTE — Progress Notes (Signed)
[place] at [place] NAME: Ulus Hazen    MR#:  [id number]  DATE OF BIRTH:  Jul 03, 1930  SUBJECTIVE:  CHIEF COMPLAINT:   Chief Complaint  Patient presents with  . Shortness of Breath   Much improved today.  On 2 L oxygen.  He was on high flow nasal cannula yesterday.  Cough is almost gone.  He also has his hearing aids back and in good spirits.  Daughter who is a Marine scientist is at bedside.  REVIEW OF SYSTEMS:    Review of Systems  Constitutional: Positive for malaise/fatigue. Negative for chills, fever and weight loss.  HENT: Negative for hearing loss and nosebleeds.   Eyes: Negative for blurred vision, double vision and pain.  Respiratory: Positive for cough, sputum production and shortness of breath. Negative for hemoptysis and wheezing.   Cardiovascular: Negative for chest pain, palpitations, orthopnea and leg swelling.  Gastrointestinal: Negative for abdominal pain, constipation, diarrhea, nausea and vomiting.  Genitourinary: Negative for dysuria and hematuria.  Musculoskeletal: Negative for back pain, falls and myalgias.  Skin: Negative for rash.  Neurological: Negative for dizziness, tremors, sensory change, speech change, focal weakness, seizures and headaches.  Endo/Heme/Allergies: Does not bruise/bleed easily.  Psychiatric/Behavioral: Negative for depression and memory loss. The patient is not nervous/anxious.     DRUG ALLERGIES:   Allergies  Allergen Reactions  . Penicillins Rash    Did it involve swelling of the face/tongue/throat, SOB, or low BP? Unknown Did it involve sudden or severe rash/hives, skin peeling, or any reaction on the inside of your mouth or nose? Unknown Did you need to seek medical attention at a hospital or doctor's office? Unknown When did it last happen?Unknown If all above answers are "NO", may proceed with cephalosporin use.     VITALS:  Blood pressure (!) 186/83, pulse 99, temperature 98.1  F (36.7 C), temperature source Oral, resp. rate 20, height 6' (1.829 m), weight 80.9 kg, SpO2 94 %.  PHYSICAL EXAMINATION:   Physical Exam  GENERAL:  85 y.o.-year-old patient lying in the bed with conversational dyspnea EYES: Pupils equal, round, reactive to light and accommodation. No scleral icterus. Extraocular muscles intact.  HEENT: Head atraumatic, normocephalic. Oropharynx and nasopharynx clear.  NECK:  Supple, no jugular venous distention. No thyroid enlargement, no tenderness.  LUNGS: Bilateral wheezing and crackles CARDIOVASCULAR: S1, S2 normal. No murmurs, rubs, or gallops.  ABDOMEN: Soft, nontender, nondistended. Bowel sounds present. No organomegaly or mass.  EXTREMITIES: No cyanosis, clubbing or edema b/l.    NEUROLOGIC: Cranial nerves II through XII are intact. No focal Motor or sensory deficits b/l.   PSYCHIATRIC: The patient is alert and oriented x 3.  SKIN: No obvious rash, lesion, or ulcer.   LABORATORY PANEL:   CBC Recent Labs  Lab 02/25/19 0116  WBC 16.5*  HGB 13.1  HCT 38.9*  PLT 139*   ------------------------------------------------------------------------------------------------------------------ Chemistries  Recent Labs  Lab 02/26/19 0605  NA 139  K 4.2  CL 107  CO2 22  GLUCOSE 187*  BUN 44*  CREATININE 1.61*  CALCIUM 8.7*   ------------------------------------------------------------------------------------------------------------------  Cardiac Enzymes Recent Labs  Lab 02/25/19 0711  TROPONINI 0.21*   ------------------------------------------------------------------------------------------------------------------  RADIOLOGY:  Dg Chest Port 1 View  Result Date: 02/25/2019 CLINICAL DATA:  Respiratory failure EXAM: PORTABLE CHEST 1 VIEW COMPARISON:  Yesterday FINDINGS: Increased hazy density of the bilateral chest attributed to layering pleural effusions. There is superimposed interstitial opacity/vascular congestion. Borderline  heart size, partially obscured. No pneumothorax. IMPRESSION:  1. Increased layering of pleural effusions, likely moderate volume. 2. Pulmonary edema. Electronically Signed   By: Monte Fantasia M.D.   On: 02/25/2019 06:56     ASSESSMENT AND PLAN:   * Acute hypoxic resp failure Due to CHF and PNA Off bipap/HFNC Today is on 2 L oxygen Wean oxygen as tolerated  * Acute on chronic diastolic chf - IV Lasix--> changed to oral Lasix 40 mg from tomorrow.  Stop IV - Input and Output - Monitor Bun/Cr and Potassium - Echo -ejection fraction 55%.  Severe aortic stenosis which is known. -Cardiology follow up after discharge  * Bilateral pneumonia Continue levaquin Leucocytosis present Added steroids yesterday.  Today wheezing is much improved.  Decrease to prednisone 20 mg.  * Severe Aortic stenosis Follows with [facility]il [facility] at [facility].  * CKD3 Stable Monitor while being diuresed  * pAfib On eliquis  * Elevated troponin Repeated and stable Due to demand ischemia  Likely discharge tomorrow  All the records are reviewed and case discussed with Care Management/Social Worker Management plans discussed with the patient, family and they are in agreement.  CODE STATUS: DNR/DNI  DVT Prophylaxis: SCDs  TOTAL TIME TAKING CARE OF THIS PATIENT: 35 minutes.   POSSIBLE D/C IN 1-2 DAYS, DEPENDING ON CLINICAL CONDITION.  Neita Carp M.D on 02/26/2019 at 3:06 PM  Between 7am to 6pm - Pager - [phone]  After 6pm go to [email or web address] - password [email or web address] [facility]  Office  [phone]  CC: Primary care physician; Shapely-Quinn, Okey Regal, MD  Note: This dictation was prepared with Dragon dictation along with smaller phrase technology. Any transcriptional errors that result from this process are unintentional.

## 2019-02-26 NOTE — Plan of Care (Signed)
  Problem: Clinical Measurements: Goal: Respiratory complications will improve Outcome: Progressing Note:  Weaning O2 down, pt now on 2L [place]   Problem: Activity: Goal: Risk for activity intolerance will decrease Outcome: Progressing Note:  Up to bathroom with 1 assist, tolerating well   Problem: Nutrition: Goal: Adequate nutrition will be maintained Outcome: Progressing   Problem: Coping: Goal: Level of anxiety will decrease Outcome: Progressing   Problem: Pain Managment: Goal: General experience of comfort will improve Outcome: Progressing Note:  No complaints of pain this shift   Problem: Safety: Goal: Ability to remain free from injury will improve Outcome: Progressing   Problem: Skin Integrity: Goal: Risk for impaired skin integrity will decrease Outcome: Progressing

## 2019-02-26 NOTE — Plan of Care (Signed)

## 2019-02-27 LAB — BASIC METABOLIC PANEL
Anion gap: 10 (ref 5–15)
BUN: 46 mg/dL — ABNORMAL HIGH (ref 8–23)
CALCIUM: 8.5 mg/dL — AB (ref 8.9–10.3)
CO2: 24 mmol/L (ref 22–32)
Chloride: 107 mmol/L (ref 98–111)
Creatinine, Ser: 1.28 mg/dL — ABNORMAL HIGH (ref 0.61–1.24)
GFR calc Af Amer: 58 mL/min — ABNORMAL LOW (ref >60)
GFR calc non Af Amer: 50 mL/min — ABNORMAL LOW (ref >60)
Glucose, Bld: 139 mg/dL — ABNORMAL HIGH (ref 70–99)
Potassium: 3.5 mmol/L (ref 3.5–5.1)
Sodium: 141 mmol/L (ref 135–145)

## 2019-02-27 LAB — GLUCOSE, CAPILLARY
Glucose-Capillary: 167 mg/dL — ABNORMAL HIGH (ref 70–99)
Glucose-Capillary: 216 mg/dL — ABNORMAL HIGH (ref 70–99)
Glucose-Capillary: 266 mg/dL — ABNORMAL HIGH (ref 70–99)
Glucose-Capillary: 168 mg/dL — ABNORMAL HIGH (ref 70–99)

## 2019-02-27 MED ORDER — IPRATROPIUM-ALBUTEROL 0.5-2.5 (3) MG/3ML IN SOLN
3.0000 mL | Freq: Four times a day (QID) | RESPIRATORY_TRACT | Status: DC
  Administered 2019-02-27 – 2019-03-01 (×7): 3 mL via RESPIRATORY_TRACT
  Filled 2019-02-27 (×7): qty 3

## 2019-02-27 MED ORDER — FUROSEMIDE 40 MG PO TABS
40.0000 mg | ORAL_TABLET | Freq: Two times a day (BID) | ORAL | Status: DC
  Administered 2019-02-27 – 2019-03-01 (×4): 40 mg via ORAL
  Filled 2019-02-27 (×4): qty 1

## 2019-02-27 MED ORDER — SODIUM CHLORIDE 0.9 % IV SOLN
INTRAVENOUS | Status: DC | PRN
  Administered 2019-02-27: 500 mL via INTRAVENOUS

## 2019-02-27 NOTE — Evaluation (Signed)
Physical Therapy Evaluation Patient Details Name: Jacob Simpson MRN: [id number] DOB: 1930/04/18 Today's Date: 02/27/2019   History of Present Illness  Pt is an 85 y.o. male presenting to hospital 02/24/19 with respiratory distress and initially requiring BiPAP.  Pt admitted with acute hypoxic respiratory failure d/t acute on chronic diastolic CHF and possible B PNA.  PMH includes paroxysmal a-fib, depression, thrombocytopenia, B-cell lymphoma, back surgery, R RCR, and B TKA.  Clinical Impression  Prior to hospital admission, pt was independent with ambulation.  Pt lives alone but has family support.  Currently pt is modified independent with bed mobility; SBA with transfers; and CGA ambulating with RW around nursing loop (SOB noted).  Pt requesting to use RW d/t feeling unsteady otherwise; vc's required for correct use of walker (improved use noted during ambulation).  Pt's O2 sats 95% on room air at rest and was 94% or greater on room air during and after ambulation; HR 93-103 bpm during session's activities.  Generalized weakness noted.  Pt would benefit from skilled PT to address noted impairments and functional limitations (see below for any additional details).  Upon hospital discharge, recommend pt discharge with HHPT.    Follow Up Recommendations Home health PT    Equipment Recommendations  Rolling walker with 5" wheels    Recommendations for Other Services       Precautions / Restrictions Precautions Precautions: Fall Restrictions Weight Bearing Restrictions: No      Mobility  Bed Mobility Overal bed mobility: Modified Independent             General bed mobility comments: Semi-supine to/from sit without any noted difficulty  Transfers Overall transfer level: Needs assistance Equipment used: Rolling walker (2 wheeled) Transfers: Sit to/from Stand Sit to Stand: Supervision         General transfer comment: mild increased effort to  stand  Ambulation/Gait Ambulation/Gait assistance: Min guard Gait Distance (Feet): 200 Feet Assistive device: Rolling walker (2 wheeled)   Gait velocity: decreased   General Gait Details: almost step through gait pattern; occasional vc's to stay closer to RW and within RW during turns; steady with RW use; occasional vc's for more upright posture (pt reports tending to have more forward posture at baseline)  Stairs            Wheelchair Mobility    Modified Rankin (Stroke Patients Only)       Balance Overall balance assessment: Needs assistance Sitting-balance support: No upper extremity supported;Feet supported Sitting balance-Leahy Scale: Normal Sitting balance - Comments: steady sitting reaching outside BOS   Standing balance support: No upper extremity supported Standing balance-Leahy Scale: Good Standing balance comment: steady standing reaching within BOS; pt holding onto sturdy object if reaching outside BOS                             Pertinent Vitals/Pain Pain Assessment: No/denies pain     Home Living Family/patient expects to be discharged to:: Private residence Living Arrangements: Alone Available Help at Discharge: Family Type of Home: House Home Access: Stairs to enter Entrance Stairs-Rails: Right Entrance Stairs-Number of Steps: 2 Home Layout: One level Home Equipment: Environmental consultant - 2 wheels;Cane - single point      Prior Function Level of Independence: Needs assistance   Gait / Transfers Assistance Needed: Independent with ambulation (no AD)  ADL's / Homemaking Assistance Needed: Family drives pt to appts  Comments: Pt's family takes turns staying at night and assisting  as needed.  Family member checks in with pt during the day.     Hand Dominance        Extremity/Trunk Assessment   Upper Extremity Assessment Upper Extremity Assessment: Generalized weakness    Lower Extremity Assessment Lower Extremity Assessment:  Generalized weakness    Cervical / Trunk Assessment Cervical / Trunk Assessment: Kyphotic  Communication   Communication: HOH  Cognition Arousal/Alertness: Awake/alert Behavior During Therapy: WFL for tasks assessed/performed Overall Cognitive Status: Within Functional Limits for tasks assessed                                        General Comments   Nursing cleared pt for participation in physical therapy.  Pt agreeable to PT session.  Pt's daughter present during session.    Exercises  Gait training   Assessment/Plan    PT Assessment Patient needs continued PT services  PT Problem List Decreased strength;Decreased activity tolerance;Decreased balance;Decreased mobility;Decreased knowledge of use of DME;Cardiopulmonary status limiting activity       PT Treatment Interventions DME instruction;Gait training;Stair training;Functional mobility training;Therapeutic activities;Therapeutic exercise;Balance training;Patient/family education    PT Goals (Current goals can be found in the Care Plan section)  Acute Rehab PT Goals Patient Stated Goal: to improve strength PT Goal Formulation: With patient Time For Goal Achievement: 03/13/19 Potential to Achieve Goals: Good    Frequency Min 2X/week   Barriers to discharge        Co-evaluation               AM-PAC PT "6 Clicks" Mobility  Outcome Measure Help needed turning from your back to your side while in a flat bed without using bedrails?: None Help needed moving from lying on your back to sitting on the side of a flat bed without using bedrails?: None Help needed moving to and from a bed to a chair (including a wheelchair)?: A Little Help needed standing up from a chair using your arms (e.g., wheelchair or bedside chair)?: A Little Help needed to walk in hospital room?: A Little Help needed climbing 3-5 steps with a railing? : A Little 6 Click Score: 20    End of Session Equipment Utilized During  Treatment: Gait belt Activity Tolerance: Patient tolerated treatment well Patient left: in bed;with call bell/phone within reach;with bed alarm set;with family/visitor present Nurse Communication: Mobility status;Precautions;Other (comment)(Pt's vitals (HR and O2)) PT Visit Diagnosis: Other abnormalities of gait and mobility (R26.89);Muscle weakness (generalized) (M62.81);Unsteadiness on feet (R26.81)    Time: 5329-9242 PT Time Calculation (min) (ACUTE ONLY): 18 min   Charges:   PT Evaluation $PT Eval Low Complexity: 1 Low PT Treatments $Gait Training: 8-22 mins       Leitha Bleak, PT 02/27/19, 2:47 PM [phone]

## 2019-02-27 NOTE — Progress Notes (Signed)
Patient ID: Jacob Simpson, male   DOB: 1930-09-03, 85 y.o.   MRN: [id number]  [facility] PROGRESS NOTE  NICHLAS PITERA CZY:[id number] DOB: Dec 27, 1929 DOA: 02/24/2019 PCP: Cecile Sheerer, MD  HPI/Subjective: Patient feeling a little bit better.  Still with some cough.  Bringing up some yellow phlegm.  Objective: Vitals:   02/27/19 0834 02/27/19 0856  BP:  (!) 150/101  Pulse:  98  Resp:  16  Temp:  98.3 F (36.8 C)  SpO2: 96% 98%    Filed Weights   02/24/19 1527 02/25/19 0419 02/26/19 0441  Weight: 86.3 kg 83.9 kg 80.9 kg    ROS: Review of Systems  Constitutional: Negative for chills and fever.  Eyes: Negative for blurred vision.  Respiratory: Positive for cough and shortness of breath.   Cardiovascular: Negative for chest pain.  Gastrointestinal: Negative for abdominal pain, constipation, diarrhea, nausea and vomiting.  Genitourinary: Negative for dysuria.  Musculoskeletal: Negative for joint pain.  Neurological: Negative for dizziness and headaches.   Exam: Physical Exam  Constitutional: He is oriented to person, place, and time.  HENT:  Nose: No mucosal edema.  Mouth/Throat: No oropharyngeal exudate or posterior oropharyngeal edema.  Eyes: Pupils are equal, round, and reactive to light. Conjunctivae, EOM and lids are normal.  Neck: No JVD present. Carotid bruit is not present. No edema present. No thyroid mass and no thyromegaly present.  Cardiovascular: S1 normal and S2 normal. Exam reveals no gallop.  No murmur heard. Pulses:      Dorsalis pedis pulses are 2+ on the right side and 2+ on the left side.  Respiratory: No respiratory distress. He has no wheezes. He has no rhonchi. He has no rales.  GI: Soft. Bowel sounds are normal. There is no abdominal tenderness.  Musculoskeletal:     Right ankle: He exhibits no swelling.     Left ankle: He exhibits no swelling.  Lymphadenopathy:    He has no cervical adenopathy.  Neurological: He is alert and  oriented to person, place, and time. No cranial nerve deficit.  Skin: Skin is warm. No rash noted. Nails show no clubbing.  Psychiatric: He has a normal mood and affect.      Data Reviewed: Basic Metabolic Panel: Recent Labs  Lab 02/24/19 0833 02/25/19 0116 02/26/19 0605 02/27/19 0546  NA 139 140 139 141  K 4.2 4.5 4.2 3.5  CL 109 106 107 107  CO2 20* 24 22 24   GLUCOSE 237* 164* 187* 139*  BUN 25* 30* 44* 46*  CREATININE 1.30* 1.53* 1.61* 1.28*  CALCIUM 9.0 8.8* 8.7* 8.5*   CBC: Recent Labs  Lab 02/24/19 0833 02/25/19 0116  WBC 17.3* 16.5*  NEUTROABS 14.8*  --   HGB 13.9 13.1  HCT 41.8 38.9*  MCV 96.5 96.0  PLT 155 139*   Cardiac Enzymes: Recent Labs  Lab 02/24/19 0833 02/25/19 0116 02/25/19 0711  TROPONINI 0.29* 0.32* 0.21*   BNP (last 3 results) Recent Labs    02/24/19 0833 02/25/19 0116  BNP 1,114.0* 873.0*    CBG: Recent Labs  Lab 02/26/19 1144 02/26/19 1625 02/26/19 2141 02/27/19 0743 02/27/19 1146  GLUCAP 234* 140* 267* 167* 216*    Recent Results (from the past 240 hour(s))  Blood culture (routine x 2)     Status: None (Preliminary result)   Collection Time: 02/24/19  9:08 AM  Result Value Ref Range Status   Specimen Description BLOOD RIGHT [facility]  Final   Special Requests   Final  BOTTLES DRAWN AEROBIC AND ANAEROBIC Blood Culture adequate volume   Culture   Final    NO GROWTH 3 DAYS Performed at [facility], [address] [place], [place] [address]    Report Status PENDING  Incomplete  Blood culture (routine x 2)     Status: None (Preliminary result)   Collection Time: 02/24/19  9:08 AM  Result Value Ref Range Status   Specimen Description BLOOD RIGHT HAND  Final   Special Requests   Final    BOTTLES DRAWN AEROBIC AND ANAEROBIC Blood Culture results may not be optimal due to an inadequate volume of blood received in culture bottles   Culture   Final    NO GROWTH 3 DAYS Performed at [facility], [address] [place], [place] [address]    Report Status PENDING  Incomplete  MRSA PCR Screening     Status: None   Collection Time: 02/24/19  3:35 PM  Result Value Ref Range Status   MRSA by PCR NEGATIVE NEGATIVE Final    Comment:        The GeneXpert MRSA Assay (FDA approved for NASAL specimens only), is one component of a comprehensive MRSA colonization surveillance program. It is not intended to diagnose MRSA infection nor to guide or monitor treatment for MRSA infections. Performed at [facility], [address] [place], [place] [address]   Expectorated sputum assessment w rflx to resp cult     Status: None   Collection Time: 02/24/19 10:14 PM  Result Value Ref Range Status   Specimen Description SPUTUM  Final   Special Requests Normal  Final   Sputum evaluation   Final    Sputum specimen not acceptable for testing.  Please recollect.   Merwyn Katos @2308  02/24/2019 TTG Performed at [facility], [address], [place], [place] [address]    Report Status 02/25/2019 FINAL  Final      Scheduled Meds: . apixaban  2.5 mg Oral BID  . aspirin EC  81 mg Oral Daily  . atorvastatin  40 mg Oral Daily  . budesonide (PULMICORT) nebulizer solution  0.5 mg Nebulization BID  . furosemide  40 mg Oral Daily  . insulin aspart  0-5 Units Subcutaneous QHS  . insulin aspart  0-9 Units Subcutaneous TID WC  . ipratropium-albuterol  3 mL Nebulization Q6H  . metoprolol succinate  50 mg Oral Daily  . mirtazapine  7.5 mg Oral QHS  . pantoprazole  40 mg Oral Daily  . predniSONE  20 mg Oral Q breakfast  . sodium chloride flush  3 mL Intravenous Q12H   Continuous Infusions: . sodium chloride Stopped (02/27/19 0229)    Assessment/Plan:  1. Acute on chronic diastolic congestive heart failure.  Severe aortic stenosis.  Will increase Lasix to 40 mg p.o. twice daily today since still hearing noises in the lungs.  Patient also on metoprolol. 2. Acute hypoxic respiratory  failure.  Was initially on BiPAP and then high flow nasal cannula.  Today off oxygen. 3. Bilateral pneumonia finished Levaquin.  Decrease dose of steroids. 4. Chronic kidney disease stage III.  Monitor with diuresis 5. Paroxysmal atrial fibrillation on Eliquis for anticoagulation and metoprolol for rate control. 6. Hyperlipidemia unspecified on atorvastatin  Code Status:     Code Status Orders  (From admission, onward)         Start     Ordered   02/24/19 1023  Do not attempt resuscitation (DNR)  Continuous  Question Answer Comment  In the event of cardiac or respiratory ARREST Do not call a "code blue"   In the event of cardiac or respiratory ARREST Do not perform Intubation, CPR, defibrillation or ACLS   In the event of cardiac or respiratory ARREST Use medication by any route, position, wound care, and other measures to relive pain and suffering. May use oxygen, suction and manual treatment of airway obstruction as needed for comfort.      02/24/19 1022        Code Status History    Date Active Date Inactive Code Status Order ID Comments User Context   02/24/2019 0940 02/24/2019 1022 Full Code [id number]  Hillary Bow, MD ED     Family Communication: Not a good working phone number. Disposition Plan: Potential discharge tomorrow  Time spent: 28 minutes  [place]

## 2019-02-27 NOTE — Care Management Important Message (Signed)
Important Message  Patient Details  Name: Jacob Simpson MRN: [id number] Date of Birth: 06-25-1930   Medicare Important Message Given:  Yes    Dannette Barbara 02/27/2019, 10:45 AM

## 2019-02-27 NOTE — Progress Notes (Signed)
SaO2 on room air at rest = 95% SaO2 on room air while ambulating = 94%  Leitha Bleak, PT 02/27/19, 2:57 PM [phone]

## 2019-02-27 NOTE — Plan of Care (Signed)
  Problem: Activity: Goal: Risk for activity intolerance will decrease Outcome: Progressing Note:  Standing to use urinal tolerating well   Problem: Nutrition: Goal: Adequate nutrition will be maintained Outcome: Progressing   Problem: Elimination: Goal: Will not experience complications related to urinary retention Outcome: Progressing   Problem: Pain Managment: Goal: General experience of comfort will improve Outcome: Progressing Note:  No complaints of pain this shift   Problem: Safety: Goal: Ability to remain free from injury will improve Outcome: Progressing   Problem: Skin Integrity: Goal: Risk for impaired skin integrity will decrease Outcome: Progressing

## 2019-02-27 NOTE — Plan of Care (Signed)

## 2019-02-28 ENCOUNTER — Inpatient Hospital Stay: Payer: Medicare Other

## 2019-02-28 LAB — INFLUENZA PANEL BY PCR (TYPE A & B)
Influenza A By PCR: NEGATIVE
Influenza B By PCR: NEGATIVE

## 2019-02-28 LAB — CBC
HCT: 40 % (ref 39.0–52.0)
Hemoglobin: 13.7 g/dL (ref 13.0–17.0)
MCH: 32.3 pg (ref 26.0–34.0)
MCHC: 34.3 g/dL (ref 30.0–36.0)
MCV: 94.3 fL (ref 80.0–100.0)
NRBC: 0 % (ref 0.0–0.2)
Platelets: 128 10*3/uL — ABNORMAL LOW (ref 150–400)
RBC: 4.24 MIL/uL (ref 4.22–5.81)
RDW: 12.2 % (ref 11.5–15.5)
WBC: 15.5 10*3/uL — ABNORMAL HIGH (ref 4.0–10.5)

## 2019-02-28 LAB — BASIC METABOLIC PANEL
Anion gap: 9 (ref 5–15)
BUN: 44 mg/dL — ABNORMAL HIGH (ref 8–23)
CO2: 24 mmol/L (ref 22–32)
Calcium: 8.2 mg/dL — ABNORMAL LOW (ref 8.9–10.3)
Chloride: 107 mmol/L (ref 98–111)
Creatinine, Ser: 1.4 mg/dL — ABNORMAL HIGH (ref 0.61–1.24)
GFR calc Af Amer: 52 mL/min — ABNORMAL LOW (ref >60)
GFR calc non Af Amer: 45 mL/min — ABNORMAL LOW (ref >60)
GLUCOSE: 144 mg/dL — AB (ref 70–99)
Potassium: 3.5 mmol/L (ref 3.5–5.1)
Sodium: 140 mmol/L (ref 135–145)

## 2019-02-28 LAB — GLUCOSE, CAPILLARY
GLUCOSE-CAPILLARY: 298 mg/dL — AB (ref 70–99)
Glucose-Capillary: 120 mg/dL — ABNORMAL HIGH (ref 70–99)
Glucose-Capillary: 265 mg/dL — ABNORMAL HIGH (ref 70–99)
Glucose-Capillary: 271 mg/dL — ABNORMAL HIGH (ref 70–99)

## 2019-02-28 IMAGING — DX PORTABLE CHEST - 1 VIEW
2 series · 2 of 2 positions shown · non-contrast
Comparison: [DATE]

CLINICAL DATA: Recent pneumonia.  Persistent cough.

EXAM:
PORTABLE CHEST 1 VIEW

[chest ap (1 of 2)]
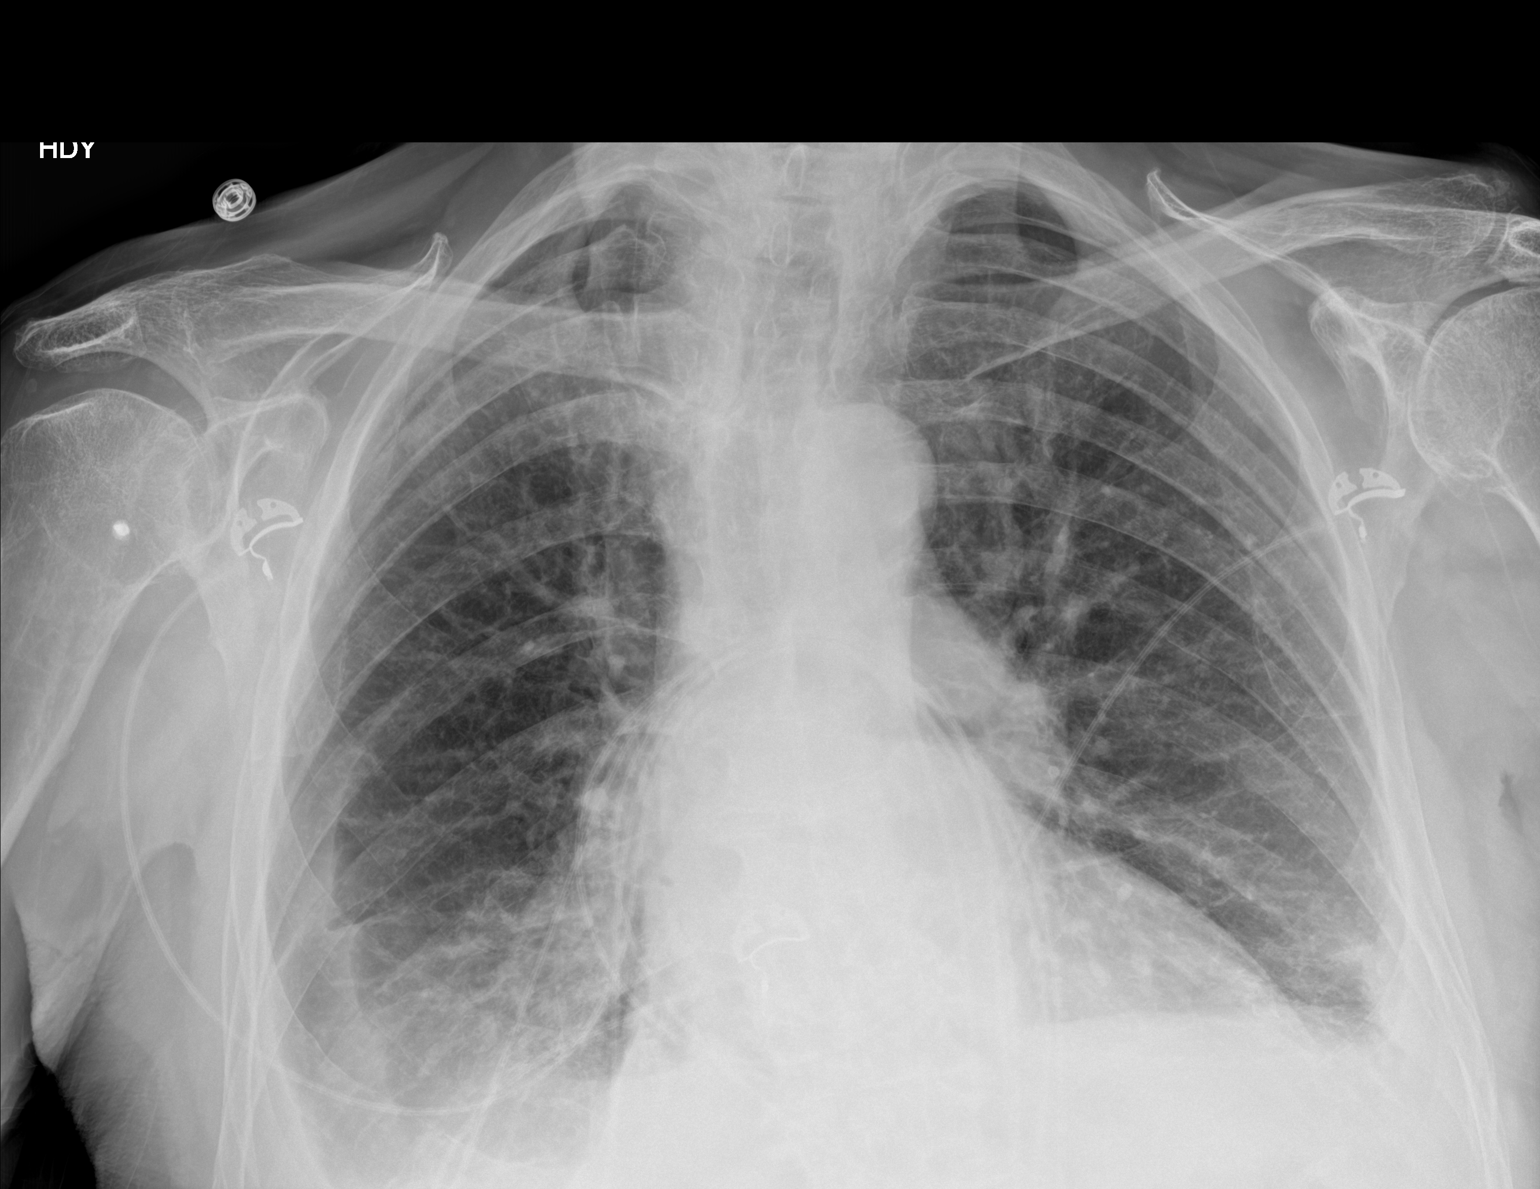

[chest ap (2 of 2)]
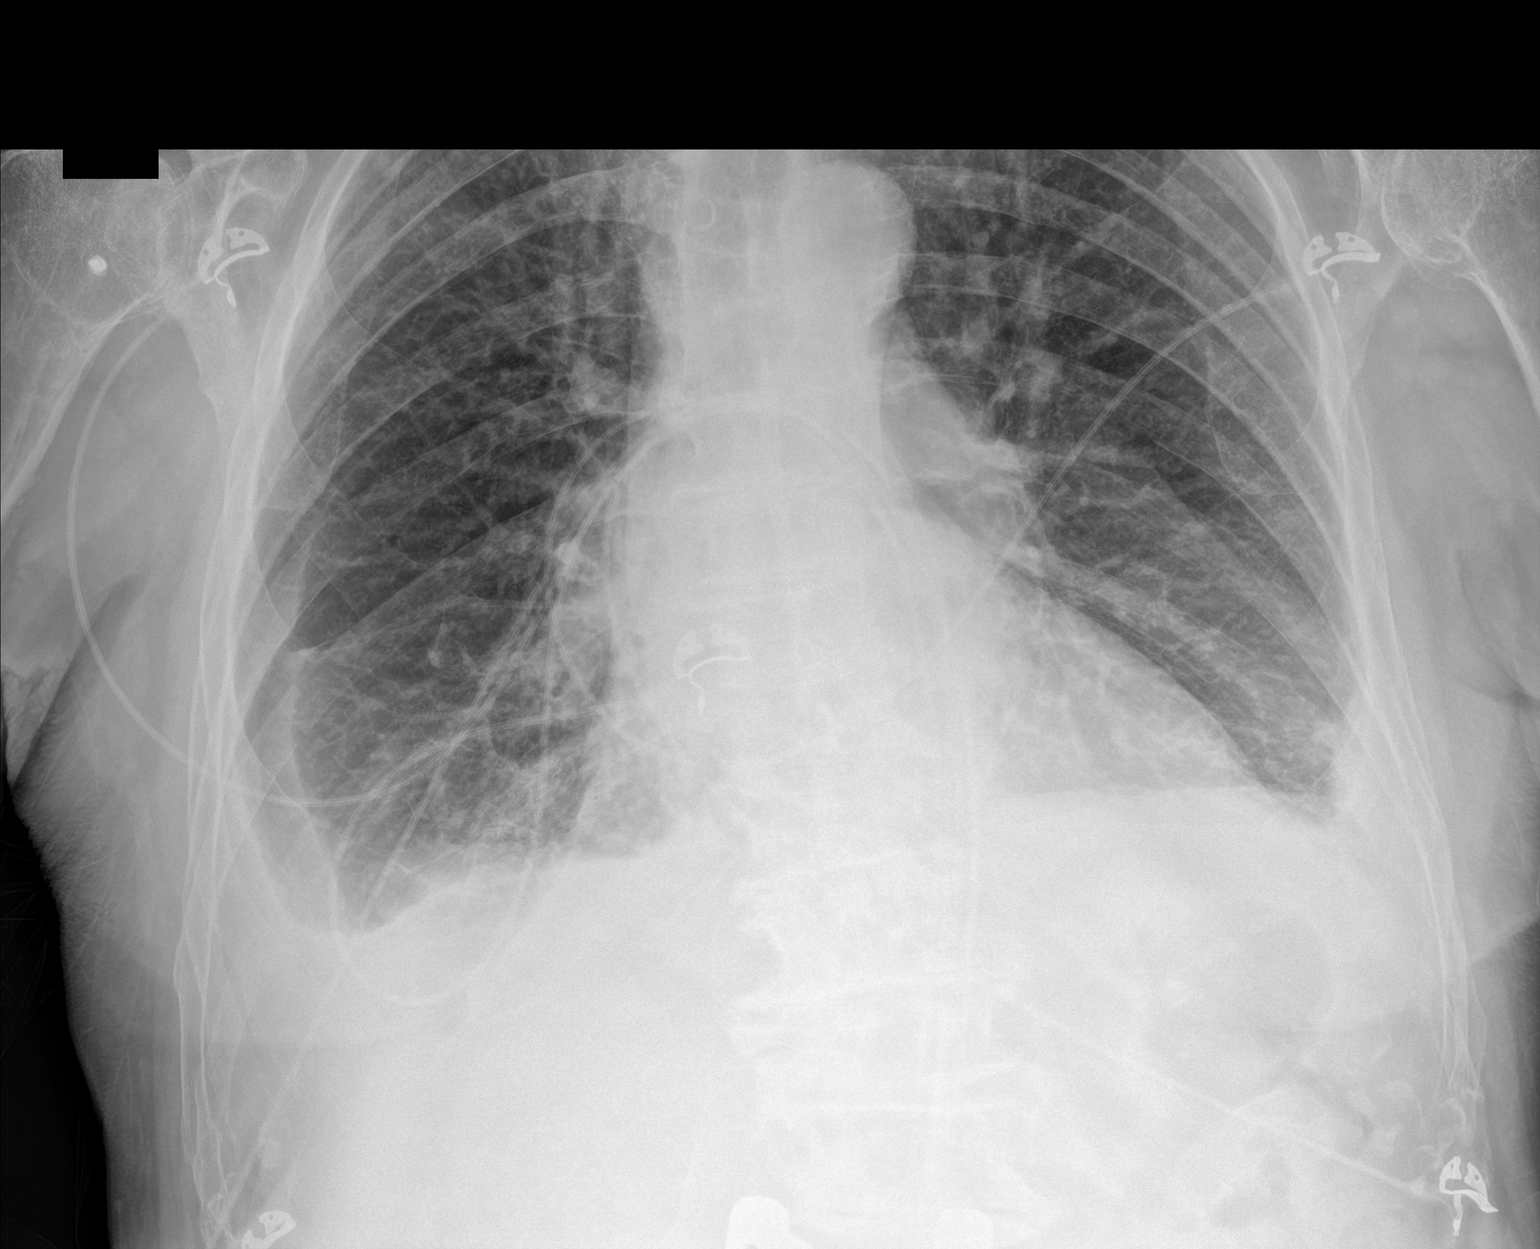

[2 of 2 positions shown; findings below may reference images not displayed]

FINDINGS: Some radiographic improvement. Cardiomegaly. Persistent small
effusions with dependent atelectasis, improved. No worsening or new
abnormalities.
IMPRESSION: Radiographic improvement. Effusions are smaller. Less basilar
atelectasis/infiltrate. Whereas this could represent improving
pneumonia, radiographic findings of most consistent with improved
congestive heart failure.

## 2019-02-28 MED ORDER — APIXABAN 5 MG PO TABS
5.0000 mg | ORAL_TABLET | Freq: Two times a day (BID) | ORAL | Status: DC
  Administered 2019-02-28 – 2019-03-01 (×2): 5 mg via ORAL
  Filled 2019-02-28 (×2): qty 1

## 2019-02-28 MED ORDER — APIXABAN 2.5 MG PO TABS
2.5000 mg | ORAL_TABLET | Freq: Once | ORAL | Status: AC
  Administered 2019-02-28: 2.5 mg via ORAL
  Filled 2019-02-28: qty 1

## 2019-02-28 NOTE — Progress Notes (Signed)
Patient ID: Jacob Simpson, male   DOB: Mar 25, 1930, 85 y.o.   MRN: [id number]  ACP note  Patient and daughter at the bedside.  Patient is a DNR.  Diagnosis: Acute diastolic congestive heart failure with severe aortic stenosis.    These 2 factors in combination have a high mortality rate and a poor prognosis.  In a younger patient they may have considered an aortic valve replacement.  In him we have to do the best we can managing fluid.  I am still hearing noises in the lung sounds taking longer than expected to improve.  Continue to monitor on a daily basis in order to decide when he can potentially get out of the hospital.  Time spent on ACP discussion 17 minutes Dr Loletha Grayer

## 2019-02-28 NOTE — Progress Notes (Signed)
Patient ID: Jacob Simpson, male   DOB: [birth year], 85 y.o.   MRN: [id number]  [facility] PROGRESS NOTE  Jacob Simpson LFY:[id number] DOB: 12/19/1929 DOA: 02/24/2019 PCP: Cecile Sheerer, MD  HPI/Subjective: Patient still with some cough and shortness of breath.  Still bringing up some sputum.  Objective: Vitals:   02/28/19 0750 02/28/19 1344  BP: 129/86   Pulse: 90   Resp: 20   Temp: 98.2 F (36.8 C)   SpO2: 96% 95%    Filed Weights   02/25/19 0419 02/26/19 0441 02/28/19 0207  Weight: 83.9 kg 80.9 kg 79.6 kg    ROS: Review of Systems  Constitutional: Negative for chills and fever.  Eyes: Negative for blurred vision.  Respiratory: Positive for cough and shortness of breath.   Cardiovascular: Negative for chest pain.  Gastrointestinal: Negative for abdominal pain, constipation, diarrhea, nausea and vomiting.  Genitourinary: Negative for dysuria.  Musculoskeletal: Negative for joint pain.  Neurological: Negative for dizziness and headaches.   Exam: Physical Exam  Constitutional: He is oriented to person, place, and time.  HENT:  Nose: No mucosal edema.  Mouth/Throat: No oropharyngeal exudate or posterior oropharyngeal edema.  Eyes: Pupils are equal, round, and reactive to light. Conjunctivae, EOM and lids are normal.  Neck: No JVD present. Carotid bruit is not present. No edema present. No thyroid mass and no thyromegaly present.  Cardiovascular: S1 normal and S2 normal. Exam reveals no gallop.  No murmur heard. Pulses:      Dorsalis pedis pulses are 2+ on the right side and 2+ on the left side.  Respiratory: No respiratory distress. He has no wheezes. He has no rhonchi. He has no rales.  GI: Soft. Bowel sounds are normal. There is no abdominal tenderness.  Musculoskeletal:     Right ankle: He exhibits no swelling.     Left ankle: He exhibits no swelling.  Lymphadenopathy:    He has no cervical adenopathy.  Neurological: He is alert and oriented to  person, place, and time. No cranial nerve deficit.  Skin: Skin is warm. No rash noted. Nails show no clubbing.  Psychiatric: He has a normal mood and affect.      Data Reviewed: Basic Metabolic Panel: Recent Labs  Lab 02/24/19 0833 02/25/19 0116 02/26/19 0605 02/27/19 0546 02/28/19 0503  NA 139 140 139 141 140  K 4.2 4.5 4.2 3.5 3.5  CL 109 106 107 107 107  CO2 20* 24 22 24 24   GLUCOSE 237* 164* 187* 139* 144*  BUN 25* 30* 44* 46* 44*  CREATININE 1.30* 1.53* 1.61* 1.28* 1.40*  CALCIUM 9.0 8.8* 8.7* 8.5* 8.2*   CBC: Recent Labs  Lab 02/24/19 0833 02/25/19 0116 02/28/19 0503  WBC 17.3* 16.5* 15.5*  NEUTROABS 14.8*  --   --   HGB 13.9 13.1 13.7  HCT 41.8 38.9* 40.0  MCV 96.5 96.0 94.3  PLT 155 139* 128*   Cardiac Enzymes: Recent Labs  Lab 02/24/19 0833 02/25/19 0116 02/25/19 0711  TROPONINI 0.29* 0.32* 0.21*   BNP (last 3 results) Recent Labs    02/24/19 0833 02/25/19 0116  BNP 1,114.0* 873.0*    CBG: Recent Labs  Lab 02/27/19 1146 02/27/19 1647 02/27/19 2102 02/28/19 0804 02/28/19 1207  GLUCAP 216* 266* 168* 120* 265*    Recent Results (from the past 240 hour(s))  Blood culture (routine x 2)     Status: None (Preliminary result)   Collection Time: 02/24/19  9:08 AM  Result Value Ref Range Status  Specimen Description BLOOD RIGHT [facility]  Final   Special Requests   Final    BOTTLES DRAWN AEROBIC AND ANAEROBIC Blood Culture adequate volume   Culture   Final    NO GROWTH 4 DAYS Performed at [facility], [address], [place], [place] [address]    Report Status PENDING  Incomplete  Blood culture (routine x 2)     Status: None (Preliminary result)   Collection Time: 02/24/19  9:08 AM  Result Value Ref Range Status   Specimen Description BLOOD RIGHT HAND  Final   Special Requests   Final    BOTTLES DRAWN AEROBIC AND ANAEROBIC Blood Culture results may not be optimal due to an inadequate volume of blood received in culture bottles    Culture   Final    NO GROWTH 4 DAYS Performed at [facility], [address] [place], [place] [address]    Report Status PENDING  Incomplete  MRSA PCR Screening     Status: None   Collection Time: 02/24/19  3:35 PM  Result Value Ref Range Status   MRSA by PCR NEGATIVE NEGATIVE Final    Comment:        The GeneXpert MRSA Assay (FDA approved for NASAL specimens only), is one component of a comprehensive MRSA colonization surveillance program. It is not intended to diagnose MRSA infection nor to guide or monitor treatment for MRSA infections. Performed at [facility], [address] [place], [place] [address]   Expectorated sputum assessment w rflx to resp cult     Status: None   Collection Time: 02/24/19 10:14 PM  Result Value Ref Range Status   Specimen Description SPUTUM  Final   Special Requests Normal  Final   Sputum evaluation   Final    Sputum specimen not acceptable for testing.  Please recollect.   Merwyn Katos @2308  02/24/2019 TTG Performed at [facility], [address] [place],  [address]    Report Status 02/25/2019 FINAL  Final      Scheduled Meds: . apixaban  5 mg Oral BID  . aspirin EC  81 mg Oral Daily  . atorvastatin  40 mg Oral Daily  . budesonide (PULMICORT) nebulizer solution  0.5 mg Nebulization BID  . furosemide  40 mg Oral BID  . insulin aspart  0-5 Units Subcutaneous QHS  . insulin aspart  0-9 Units Subcutaneous TID WC  . ipratropium-albuterol  3 mL Nebulization Q6H  . metoprolol succinate  50 mg Oral Daily  . mirtazapine  7.5 mg Oral QHS  . pantoprazole  40 mg Oral Daily  . predniSONE  20 mg Oral Q breakfast  . sodium chloride flush  3 mL Intravenous Q12H   Continuous Infusions: . sodium chloride Stopped (02/27/19 0229)    Assessment/Plan:  1. Acute on chronic diastolic congestive heart failure.  Severe aortic stenosis.  Will increase Lasix to 40 mg p.o. twice daily today since still hearing  noises in the lungs.  Patient also on metoprolol. 2. Acute hypoxic respiratory failure.  Was initially on BiPAP and then high flow nasal cannula.  Today off oxygen. 3. Bilateral pneumonia finished Levaquin.  Decrease dose of steroids.  Influenza negative.  Viral respiratory panel still pending 4. Chronic kidney disease stage III.  Monitor with diuresis 5. Paroxysmal atrial fibrillation on Eliquis for anticoagulation and metoprolol for rate control. 6. Hyperlipidemia unspecified on atorvastatin  Code Status:     Code Status Orders  (From admission, onward)  Start     Ordered   02/24/19 1023  Do not attempt resuscitation (DNR)  Continuous    Question Answer Comment  In the event of cardiac or respiratory ARREST Do not call a "code blue"   In the event of cardiac or respiratory ARREST Do not perform Intubation, CPR, defibrillation or ACLS   In the event of cardiac or respiratory ARREST Use medication by any route, position, wound care, and other measures to relive pain and suffering. May use oxygen, suction and manual treatment of airway obstruction as needed for comfort.      02/24/19 1022        Code Status History    Date Active Date Inactive Code Status Order ID Comments User Context   02/24/2019 0940 02/24/2019 1022 Full Code [id number]  Hillary Bow, MD ED     Family Communication: Spoke with daughter at the bedside Disposition Plan: Evaluate daily on potential discharge  Time spent: 35 minutes, including ACP time  [employer]

## 2019-02-28 NOTE — Plan of Care (Signed)
  Problem: Clinical Measurements: Goal: Respiratory complications will improve Outcome: Progressing Note:  Now on room air   Problem: Activity: Goal: Risk for activity intolerance will decrease Outcome: Progressing Note:  Standing at bed to use urinal with one assist tolerating well   Problem: Elimination: Goal: Will not experience complications related to urinary retention Outcome: Progressing   Problem: Pain Managment: Goal: General experience of comfort will improve Outcome: Progressing Note:  No complaints of pain this shift   Problem: Safety: Goal: Ability to remain free from injury will improve Outcome: Progressing   Problem: Nutrition: Goal: Adequate nutrition will be maintained Outcome: Completed/Met

## 2019-03-01 LAB — BASIC METABOLIC PANEL
Anion gap: 9 (ref 5–15)
BUN: 36 mg/dL — ABNORMAL HIGH (ref 8–23)
CO2: 25 mmol/L (ref 22–32)
Calcium: 8 mg/dL — ABNORMAL LOW (ref 8.9–10.3)
Chloride: 107 mmol/L (ref 98–111)
Creatinine, Ser: 1.32 mg/dL — ABNORMAL HIGH (ref 0.61–1.24)
GFR calc non Af Amer: 49 mL/min — ABNORMAL LOW (ref >60)
GFR, EST AFRICAN AMERICAN: 56 mL/min — AB (ref >60)
Glucose, Bld: 151 mg/dL — ABNORMAL HIGH (ref 70–99)
Potassium: 3.4 mmol/L — ABNORMAL LOW (ref 3.5–5.1)
Sodium: 141 mmol/L (ref 135–145)

## 2019-03-01 LAB — RESPIRATORY PANEL BY PCR
ADENOVIRUS-RVPPCR: NOT DETECTED
Bordetella pertussis: NOT DETECTED
CORONAVIRUS NL63-RVPPCR: NOT DETECTED
Chlamydophila pneumoniae: NOT DETECTED
Coronavirus 229E: NOT DETECTED
Coronavirus HKU1: NOT DETECTED
Coronavirus OC43: NOT DETECTED
Influenza A: NOT DETECTED
Influenza B: NOT DETECTED
Metapneumovirus: NOT DETECTED
Mycoplasma pneumoniae: NOT DETECTED
PARAINFLUENZA VIRUS 2-RVPPCR: NOT DETECTED
Parainfluenza Virus 1: NOT DETECTED
Parainfluenza Virus 3: NOT DETECTED
Parainfluenza Virus 4: NOT DETECTED
Respiratory Syncytial Virus: NOT DETECTED
Rhinovirus / Enterovirus: NOT DETECTED

## 2019-03-01 LAB — CULTURE, BLOOD (ROUTINE X 2)
Culture: NO GROWTH
Culture: NO GROWTH
Special Requests: ADEQUATE

## 2019-03-01 LAB — GLUCOSE, CAPILLARY: Glucose-Capillary: 142 mg/dL — ABNORMAL HIGH (ref 70–99)

## 2019-03-01 MED ORDER — FUROSEMIDE 40 MG PO TABS: 40.0000 mg | ORAL_TABLET | Freq: Every day | ORAL | 0 refills | Status: DC

## 2019-03-01 MED ORDER — POTASSIUM CHLORIDE CRYS ER 20 MEQ PO TBCR
20.0000 meq | EXTENDED_RELEASE_TABLET | Freq: Two times a day (BID) | ORAL | Status: DC
  Administered 2019-03-01: 20 meq via ORAL
  Filled 2019-03-01: qty 1

## 2019-03-01 MED ORDER — POTASSIUM CHLORIDE CRYS ER 20 MEQ PO TBCR: 20.0000 meq | EXTENDED_RELEASE_TABLET | Freq: Every day | ORAL | 0 refills | Status: DC

## 2019-03-01 NOTE — Progress Notes (Signed)
Patient discharged to home. Tele and IV d/'d. Patient and daughter verbalize understanding of discharge instructions.  Belongings sent home with patient.

## 2019-03-01 NOTE — Discharge Summary (Signed)
[facility] [place] NAME: Jacob Simpson    MR#:  [id number]  DATE OF BIRTH:  83-Dec-1931  DATE OF ADMISSION:  02/24/2019 ADMITTING PHYSICIAN: Hillary Bow, MD  DATE OF DISCHARGE: 03/01/2019  1:23 PM  PRIMARY CARE PHYSICIAN: Cecile Sheerer, MD    ADMISSION DIAGNOSIS:  Acute respiratory distress [R06.03] Dyspnea [R06.00] Hypoxia [R09.02] Community acquired pneumonia of left lower lobe of lung ([place]) [J18.1]  DISCHARGE DIAGNOSIS:  Active Problems:   CHF (congestive heart failure) ([place])   SECONDARY DIAGNOSIS:   Past Medical History:  Diagnosis Date  . A-fib ([place])   . Aortic aneurysm ([place])   . Aortic stenosis   . Asthma   . CHF (congestive heart failure) ([place])   . Hammertoe   . HTN (hypertension)   . TIA (transient ischemic attack)     HOSPITAL COURSE:   1.  Acute on chronic diastolic congestive heart failure with severe aortic stenosis.  Continue Toprol and change Lasix to 40 mg orally daily.  Patient interested to follow-up with cardiology as outpatient.  Overall prognosis is poor with heart failure and severe aortic stenosis. 2.  Acute hypoxic respiratory failure.  The patient was initially on BiPAP and then over to high flow nasal cannula then tapered off the nasal cannula.  Currently on room air.  Saturating well. 3.  Bilateral pneumonia.  Finished course of Levaquin while here.  Flu was negative.  Respiratory panel negative. Steroids were stopped. 4.  Chronic kidney disease stage III.  Continue to monitor as outpatient with diuresis.  Creatinine upon discharge 1.32. 5.  Paroxysmal atrial fibrillation on Eliquis for anticoagulation and Toprol for rate control 6.  Hyperlipidemia unspecified on atorvastatin 7.  Home health with CHF protocol set up  DISCHARGE CONDITIONS:   Satisfactory  CONSULTS OBTAINED:  None  DRUG ALLERGIES:   Allergies  Allergen Reactions  . Penicillins Rash    Did it involve swelling of  the face/tongue/throat, SOB, or low BP? Unknown Did it involve sudden or severe rash/hives, skin peeling, or any reaction on the inside of your mouth or nose? Unknown Did you need to seek medical attention at a hospital or doctor's office? Unknown When did it last happen?Unknown If all above answers are "NO", may proceed with cephalosporin use.     DISCHARGE MEDICATIONS:   Allergies as of 03/01/2019      Reactions   Penicillins Rash   Did it involve swelling of the face/tongue/throat, SOB, or low BP? Unknown Did it involve sudden or severe rash/hives, skin peeling, or any reaction on the inside of your mouth or nose? Unknown Did you need to seek medical attention at a hospital or doctor's office? Unknown When did it last happen?Unknown If all above answers are "NO", may proceed with cephalosporin use.      Medication List    TAKE these medications   acetaminophen 500 MG tablet Commonly known as:  TYLENOL Take 1,000 mg by mouth every 6 (six) hours as needed for mild pain, moderate pain or fever.   aspirin 81 MG tablet Take 81 mg by mouth daily.   atorvastatin 40 MG tablet Commonly known as:  LIPITOR Take 40 mg by mouth daily.   azelastine 0.1 % nasal spray Commonly known as:  ASTELIN Place 1 spray into both nostrils 2 (two) times daily as needed for rhinitis or allergies. Use in each nostril as directed   benzonatate 200 MG capsule Commonly known as:  TESSALON Take  200 mg by mouth 3 (three) times daily as needed for cough.   cetirizine 10 MG tablet Commonly known as:  ZYRTEC Take 10 mg by mouth daily as needed for allergies.   Eliquis 5 MG Tabs tablet Generic drug:  apixaban Take 5 mg by mouth 2 (two) times daily.   famotidine 10 MG tablet Commonly known as:  PEPCID Take 10 mg by mouth daily as needed for heartburn or indigestion.   fluticasone 110 MCG/ACT inhaler Commonly known as:  FLOVENT HFA Inhale 1 puff into the lungs 2 (two) times daily.    furosemide 40 MG tablet Commonly known as:  LASIX Take 1 tablet (40 mg total) by mouth daily.   guaiFENesin 600 MG 12 hr tablet Commonly known as:  MUCINEX Take 600 mg by mouth 2 (two) times daily as needed for cough or to loosen phlegm.   levalbuterol 45 MCG/ACT inhaler Commonly known as:  XOPENEX HFA Inhale 2 puffs into the lungs every 4 (four) hours as needed for wheezing or shortness of breath.   meclizine 12.5 MG tablet Commonly known as:  ANTIVERT Take 12.5 mg by mouth 3 (three) times daily as needed for dizziness.   metoprolol succinate 50 MG 24 hr tablet Commonly known as:  TOPROL-XL Take 50 mg by mouth daily. Take with or immediately following a meal.   mirtazapine 7.5 MG tablet Commonly known as:  REMERON Take 7.5 mg by mouth at bedtime.   multivitamin-iron-minerals-folic acid chewable tablet Chew 1 tablet by mouth daily.   omeprazole 20 MG capsule Commonly known as:  PRILOSEC Take 20 mg by mouth daily as needed (stomach acid).   ondansetron 8 MG disintegrating tablet Commonly known as:  ZOFRAN-ODT Take 8 mg by mouth every 8 (eight) hours as needed for nausea or vomiting.   potassium chloride SA 20 MEQ tablet Commonly known as:  K-DUR,KLOR-CON Take 1 tablet (20 mEq total) by mouth daily.        DISCHARGE INSTRUCTIONS:   Follow-up PMD 5 days Follow-up cardiology 1 to 2 weeks  If you experience worsening of your admission symptoms, develop shortness of breath, life threatening emergency, suicidal or homicidal thoughts you must seek medical attention immediately by calling 911 or calling your MD immediately  if symptoms less severe.  You Must read complete instructions/literature along with all the possible adverse reactions/side effects for all the Medicines you take and that have been prescribed to you. Take any new Medicines after you have completely understood and accept all the possible adverse reactions/side effects.   Please note  You were cared  for by a hospitalist during your hospital stay. If you have any questions about your discharge medications or the care you received while you were in the hospital after you are discharged, you can call the unit and asked to speak with the hospitalist on call if the hospitalist that took care of you is not available. Once you are discharged, your primary care physician will handle any further medical issues. Please note that NO REFILLS for any discharge medications will be authorized once you are discharged, as it is imperative that you return to your primary care physician (or establish a relationship with a primary care physician if you do not have one) for your aftercare needs so that they can reassess your need for medications and monitor your lab values.    Today   CHIEF COMPLAINT:   Chief Complaint  Patient presents with  . Shortness of Breath    HISTORY  OF PRESENT ILLNESS:  Rawad Bochicchio  is a 85 y.o. male presented with shortness of breath and found to be in heart failure   VITAL SIGNS:  Blood pressure (!) 158/66, pulse 90, temperature 98.5 F (36.9 C), temperature source Oral, resp. rate (!) 24, height 6' (1.829 m), weight 77.4 kg, SpO2 94 %.   PHYSICAL EXAMINATION:  GENERAL:  85 y.o.-year-old patient lying in the bed with no acute distress.  EYES: Pupils equal, round, reactive to light and accommodation. No scleral icterus. Extraocular muscles intact.  HEENT: Head atraumatic, normocephalic. Oropharynx and nasopharynx clear.  NECK:  Supple, no jugular venous distention. No thyroid enlargement, no tenderness.  LUNGS: Normal breath sounds bilaterally.  Scattered rhonchi at the bases.  No use of accessory muscles of respiration.  CARDIOVASCULAR: S1, S2 normal.  4 out of 6 systolic murmur.  No rubs, or gallops.  ABDOMEN: Soft, non-tender, non-distended. Bowel sounds present. No organomegaly or mass.  EXTREMITIES: Trace pedal edema.  No cyanosis, or clubbing.  NEUROLOGIC: Cranial  nerves II through XII are intact. Muscle strength 5/5 in all extremities. Sensation intact. Gait not checked.  PSYCHIATRIC: The patient is alert and oriented x 3.  SKIN: No obvious rash, lesion, or ulcer.   DATA REVIEW:   CBC Recent Labs  Lab 02/28/19 0503  WBC 15.5*  HGB 13.7  HCT 40.0  PLT 128*    Chemistries  Recent Labs  Lab 03/01/19 0751  NA 141  K 3.4*  CL 107  CO2 25  GLUCOSE 151*  BUN 36*  CREATININE 1.32*  CALCIUM 8.0*    Cardiac Enzymes Recent Labs  Lab 02/25/19 0711  TROPONINI 0.21*    Microbiology Results  Results for orders placed or performed during the hospital encounter of 02/24/19  Blood culture (routine x 2)     Status: None   Collection Time: 02/24/19  9:08 AM  Result Value Ref Range Status   Specimen Description BLOOD RIGHT [facility]  Final   Special Requests   Final    BOTTLES DRAWN AEROBIC AND ANAEROBIC Blood Culture adequate volume   Culture   Final    NO GROWTH 5 DAYS Performed at [facility], [address] [place], [place] [address]    Report Status 03/01/2019 FINAL  Final  Blood culture (routine x 2)     Status: None   Collection Time: 02/24/19  9:08 AM  Result Value Ref Range Status   Specimen Description BLOOD RIGHT HAND  Final   Special Requests   Final    BOTTLES DRAWN AEROBIC AND ANAEROBIC Blood Culture results may not be optimal due to an inadequate volume of blood received in culture bottles   Culture   Final    NO GROWTH 5 DAYS Performed at [facility], [address] [place], [place] [address]    Report Status 03/01/2019 FINAL  Final  MRSA PCR Screening     Status: None   Collection Time: 02/24/19  3:35 PM  Result Value Ref Range Status   MRSA by PCR NEGATIVE NEGATIVE Final    Comment:        The GeneXpert MRSA Assay (FDA approved for NASAL specimens only), is one component of a comprehensive MRSA colonization surveillance program. It is not intended to diagnose MRSA infection nor to guide  or monitor treatment for MRSA infections. Performed at [facility], [address] [place],  [address]   Expectorated sputum assessment w rflx to resp cult  Status: None   Collection Time: 02/24/19 10:14 PM  Result Value Ref Range Status   Specimen Description SPUTUM  Final   Special Requests Normal  Final   Sputum evaluation   Final    Sputum specimen not acceptable for testing.  Please recollect.   Merwyn Katos @2308  02/24/2019 TTG Performed at [facility], [address] [place], [place] [address]    Report Status 02/25/2019 FINAL  Final  Respiratory Panel by PCR     Status: None   Collection Time: 02/28/19 12:28 PM  Result Value Ref Range Status   Adenovirus NOT DETECTED NOT DETECTED Final   Coronavirus 229E NOT DETECTED NOT DETECTED Final    Comment: (NOTE) The Coronavirus on the Respiratory Panel, DOES NOT test for the novel  Coronavirus (2019 nCoV)    Coronavirus HKU1 NOT DETECTED NOT DETECTED Final   Coronavirus NL63 NOT DETECTED NOT DETECTED Final   Coronavirus OC43 NOT DETECTED NOT DETECTED Final   Metapneumovirus NOT DETECTED NOT DETECTED Final   Rhinovirus / Enterovirus NOT DETECTED NOT DETECTED Final   Influenza A NOT DETECTED NOT DETECTED Final   Influenza B NOT DETECTED NOT DETECTED Final   Parainfluenza Virus 1 NOT DETECTED NOT DETECTED Final   Parainfluenza Virus 2 NOT DETECTED NOT DETECTED Final   Parainfluenza Virus 3 NOT DETECTED NOT DETECTED Final   Parainfluenza Virus 4 NOT DETECTED NOT DETECTED Final   Respiratory Syncytial Virus NOT DETECTED NOT DETECTED Final   Bordetella pertussis NOT DETECTED NOT DETECTED Final   Chlamydophila pneumoniae NOT DETECTED NOT DETECTED Final   Mycoplasma pneumoniae NOT DETECTED NOT DETECTED Final    Comment: Performed at [facility], [address], [place], [place] [address]    RADIOLOGY:  Dg Chest Port 1 View  Result Date: 02/28/2019 CLINICAL DATA:  Recent pneumonia.   Persistent cough. EXAM: PORTABLE CHEST 1 VIEW COMPARISON:  02/25/2019 FINDINGS: Some radiographic improvement. Cardiomegaly. Persistent small effusions with dependent atelectasis, improved. No worsening or new abnormalities. IMPRESSION: Radiographic improvement. Effusions are smaller. Less basilar atelectasis/infiltrate. Whereas this could represent improving pneumonia, radiographic findings of most consistent with improved congestive heart failure. Electronically Signed   By: Nelson Chimes M.D.   On: 02/28/2019 11:14    Management plans discussed with the patient, family and they are in agreement.  CODE STATUS:     Code Status Orders  (From admission, onward)         Start     Ordered   02/24/19 1023  Do not attempt resuscitation (DNR)  Continuous    Question Answer Comment  In the event of cardiac or respiratory ARREST Do not call a "code blue"   In the event of cardiac or respiratory ARREST Do not perform Intubation, CPR, defibrillation or ACLS   In the event of cardiac or respiratory ARREST Use medication by any route, position, wound care, and other measures to relive pain and suffering. May use oxygen, suction and manual treatment of airway obstruction as needed for comfort.      02/24/19 1022        Code Status History    Date Active Date Inactive Code Status Order ID Comments User Context   02/24/2019 0940 02/24/2019 1022 Full Code [id number]  Hillary Bow, MD ED      TOTAL TIME TAKING CARE OF THIS PATIENT: 35 minutes.    Loletha Grayer M.D on 03/01/2019 at 2:55 PM  Between 7am to 6pm - Pager - [phone]  After 6pm go  to [email or web address] - password [email or web address]  [employer] Office  [phone]  CC: Primary care physician; [place], Okey Regal, MD

## 2019-03-01 NOTE — Plan of Care (Signed)

## 2019-03-01 NOTE — TOC Transition Note (Signed)
Transition of Care [facility]) - CM/SW Discharge Note   Patient Details  Name: KENSTON LONGTON MRN: [id number] Date of Birth: Feb 21, 1930  Transition of Care [facility] CM/SW Contact:  Latanya Maudlin, RN Phone Number: 03/01/2019, 11:34 AM   Clinical Narrative:  Patient to be discharged per MD order. Orders in place for home health services. CMS Medicare.gov Compare Post Acute Care list reviewed with patient and daughter they are active with [facility] Notified Tommi Rumps of pending discharge and plans to resume care. No DME needs. Daughters to transport.      Final next level of care: [facility] Barriers to Discharge: No Barriers Identified   Patient Goals and CMS Choice Patient states their goals for this hospitalization and ongoing recovery are:: Go home and resume HH with [facility] Medicare.gov Compare Post Acute Care list provided to:: Patient Choice offered to / list presented to : Patient, Adult Children  Discharge Placement                       Discharge Plan and Services Discharge Planning Services: CM Consult, HF Clinic Post Acute Care Choice: Home Health              HH Arranged: RN, PT [facility] Agency: [place]   Social Determinants of Health (SDOH) Interventions     Readmission Risk Interventions No flowsheet data found.

## 2019-03-06 ENCOUNTER — Ambulatory Visit: Payer: Medicare Other | Admitting: Family

## 2019-03-10 ENCOUNTER — Other Ambulatory Visit: Payer: Self-pay

## 2019-03-10 ENCOUNTER — Encounter: Payer: Self-pay | Admitting: Family

## 2019-03-10 ENCOUNTER — Ambulatory Visit: Payer: Medicare Other | Attending: Family | Admitting: Family

## 2019-03-10 ENCOUNTER — Encounter: Payer: Self-pay | Admitting: Pharmacist

## 2019-03-10 ENCOUNTER — Telehealth: Payer: Self-pay | Admitting: Nurse Practitioner

## 2019-03-10 VITALS — BP 151/84 | HR 99 | Temp 97.6°F | Resp 18 | Ht 72.0 in | Wt 185.4 lb

## 2019-03-10 DIAGNOSIS — I110 Hypertensive heart disease with heart failure: Secondary | ICD-10-CM | POA: Diagnosis not present

## 2019-03-10 DIAGNOSIS — Z7901 Long term (current) use of anticoagulants: Secondary | ICD-10-CM | POA: Insufficient documentation

## 2019-03-10 DIAGNOSIS — I4891 Unspecified atrial fibrillation: Secondary | ICD-10-CM | POA: Insufficient documentation

## 2019-03-10 DIAGNOSIS — Z79899 Other long term (current) drug therapy: Secondary | ICD-10-CM | POA: Insufficient documentation

## 2019-03-10 DIAGNOSIS — I5032 Chronic diastolic (congestive) heart failure: Secondary | ICD-10-CM | POA: Insufficient documentation

## 2019-03-10 DIAGNOSIS — Z7982 Long term (current) use of aspirin: Secondary | ICD-10-CM | POA: Diagnosis not present

## 2019-03-10 DIAGNOSIS — I350 Nonrheumatic aortic (valve) stenosis: Secondary | ICD-10-CM

## 2019-03-10 DIAGNOSIS — Z880 Allergy status to penicillin: Secondary | ICD-10-CM | POA: Diagnosis not present

## 2019-03-10 DIAGNOSIS — Z87891 Personal history of nicotine dependence: Secondary | ICD-10-CM | POA: Diagnosis not present

## 2019-03-10 DIAGNOSIS — I10 Essential (primary) hypertension: Secondary | ICD-10-CM | POA: Insufficient documentation

## 2019-03-10 DIAGNOSIS — Z8249 Family history of ischemic heart disease and other diseases of the circulatory system: Secondary | ICD-10-CM | POA: Insufficient documentation

## 2019-03-10 DIAGNOSIS — Z8673 Personal history of transient ischemic attack (TIA), and cerebral infarction without residual deficits: Secondary | ICD-10-CM | POA: Diagnosis not present

## 2019-03-10 DIAGNOSIS — Z96653 Presence of artificial knee joint, bilateral: Secondary | ICD-10-CM | POA: Diagnosis not present

## 2019-03-10 DIAGNOSIS — J45909 Unspecified asthma, uncomplicated: Secondary | ICD-10-CM | POA: Insufficient documentation

## 2019-03-10 NOTE — Patient Instructions (Addendum)
Begin weighing daily and call for an overnight weight gain of > 2 pounds or a weekly weight gain of >5 pounds.  Increase metoprolol to 2 tablets daily until they are gone. Let me know what you have a bottle left so I can send a new prescription to your mail order company

## 2019-03-10 NOTE — Progress Notes (Signed)
Patient ID: Jacob Simpson, male    DOB: 12-Sep-1930, 85 y.o.   MRN: [id number]  HPI  Jacob Simpson is a 85 y/o male with a history of HTN, severe aortic stenosis, TIA, previous tobacco use and chronic heart failure.   Echo report from 02/25/2019 reviewed and showed an EF of 60-65% along with moderate Jacob and severe AS.   Admitted 02/24/2019 due to acute on chronic HF. Initially needed bipap and then transitioned to room air. Finished antibiotics due to pneumonia. Discharged after 5 days.   He presents today for his initial visit with a chief complaint of minimal shortness of breath upon moderate exertion. He describes this as having been present for a few months but is markedly better since hospitalization. He has associated cough (improving), palpitations (all his life), pedal edema, rhinorrhea and occasional light-headedness with sudden position changes. He denies any difficulty sleeping, abdominal distention, chest pain or fatigue. Does not have scales so hasn't been weighing daily. Is not adding any salt to his food. Will be having [place] coming ~ once / week.   Past Medical History:  Diagnosis Date  . A-fib ([place])   . Aortic aneurysm ([place])   . Aortic stenosis   . Asthma   . CHF (congestive heart failure) ([place])   . Hammertoe   . HTN (hypertension)   . TIA (transient ischemic attack)    Past Surgical History:  Procedure Laterality Date  . BACK SURGERY  12/89  . CATARACT EXTRACTION W/ INTRAOCULAR LENS IMPLANT Right 12/00  . CATARACT EXTRACTION W/ INTRAOCULAR LENS IMPLANT Left 8/01  . foot sugery Right 6/03  . FOOT SURGERY Left 1/96   hammertoe  . KIDNEY STONE SURGERY  7/90  . ROTATOR CUFF REPAIR Right 1/98  . TOTAL KNEE ARTHROPLASTY Bilateral 1987   Family History  Problem Relation Age of Onset  . Heart disease Father    Social History   Tobacco Use  . Smoking status: Former Research scientist (life sciences)  . Smokeless tobacco: Never Used  . Tobacco comment: quit 1989  Substance  Use Topics  . Alcohol use: No   Allergies  Allergen Reactions  . Penicillins Rash    Did it involve swelling of the face/tongue/throat, SOB, or low BP? Unknown Did it involve sudden or severe rash/hives, skin peeling, or any reaction on the inside of your mouth or nose? Unknown Did you need to seek medical attention at a hospital or doctor's office? Unknown When did it last happen?Unknown If all above answers are "NO", may proceed with cephalosporin use.    Prior to Admission medications   Medication Sig Start Date End Date Taking? Authorizing Provider  acetaminophen (TYLENOL) 500 MG tablet Take 1,000 mg by mouth every 6 (six) hours as needed for mild pain, moderate pain or fever.   Yes [provider]  apixaban (ELIQUIS) 5 MG TABS tablet Take 5 mg by mouth 2 (two) times daily.   Yes [provider]  aspirin 81 MG tablet Take 81 mg by mouth daily.    Yes [provider]  atorvastatin (LIPITOR) 40 MG tablet Take 40 mg by mouth daily.   Yes [provider]  azelastine (ASTELIN) 0.1 % nasal spray Place 1 spray into both nostrils 2 (two) times daily as needed for rhinitis or allergies. Use in each nostril as directed   Yes [provider]  benzonatate (TESSALON) 200 MG capsule Take 200 mg by mouth 3 (three) times daily as needed for cough.  Yes [provider]  cetirizine (ZYRTEC) 10 MG tablet Take 10 mg by mouth daily as needed for allergies.   Yes [provider]  famotidine (PEPCID) 10 MG tablet Take 10 mg by mouth daily as needed for heartburn or indigestion.   Yes [provider]  fluticasone (FLOVENT HFA) 110 MCG/ACT inhaler Inhale 1 puff into the lungs 2 (two) times daily.   Yes [provider]  furosemide (LASIX) 40 MG tablet Take 1 tablet (40 mg total) by mouth daily. 03/01/19  Yes Loletha Grayer, MD  guaiFENesin (MUCINEX) 600 MG 12 hr tablet Take 600 mg by mouth 2 (two) times daily as needed for  cough or to loosen phlegm.   Yes [provider]  levalbuterol (XOPENEX HFA) 45 MCG/ACT inhaler Inhale 2 puffs into the lungs every 4 (four) hours as needed for wheezing or shortness of breath.   Yes [provider]  meclizine (ANTIVERT) 12.5 MG tablet Take 12.5 mg by mouth 3 (three) times daily as needed for dizziness.   Yes [provider]  metoprolol succinate (TOPROL-XL) 50 MG 24 hr tablet Take 50 mg by mouth daily. Take with or immediately following a meal.   Yes [provider]  mirtazapine (REMERON) 7.5 MG tablet Take 7.5 mg by mouth at bedtime.   Yes [provider]  multivitamin-iron-minerals-folic acid (CENTRUM) chewable tablet Chew 1 tablet by mouth daily.   Yes [provider]  omeprazole (PRILOSEC) 20 MG capsule Take 20 mg by mouth daily as needed (stomach acid).   Yes [provider]  ondansetron (ZOFRAN-ODT) 8 MG disintegrating tablet Take 8 mg by mouth every 8 (eight) hours as needed for nausea or vomiting.   Yes [provider]  potassium chloride SA (K-DUR,KLOR-CON) 20 MEQ tablet Take 1 tablet (20 mEq total) by mouth daily. 03/01/19  Yes Loletha Grayer, MD    Review of Systems  Constitutional: Negative for appetite change and fatigue.  HENT: Positive for hearing loss and rhinorrhea. Negative for congestion and sore throat.   Eyes: Negative.   Respiratory: Positive for cough and shortness of breath (minimal).   Cardiovascular: Positive for palpitations and leg swelling. Negative for chest pain.  Gastrointestinal: Negative for abdominal distention and abdominal pain.  Endocrine: Negative.   Genitourinary: Negative.   Skin: Negative.   Allergic/Immunologic: Negative.   Neurological: Positive for light-headedness (with sudden position changes). Negative for dizziness.  Hematological: Negative for adenopathy. Does not bruise/bleed easily.  Psychiatric/Behavioral: Negative for dysphoric mood and sleep  disturbance (sleeping on 2 pillows). The patient is not nervous/anxious.     Vitals:   03/10/19 1159  BP: (!) 151/84  Pulse: 99  Resp: 18  Temp: 97.6 F (36.4 C)  SpO2: 98%  Weight: 185 lb 6 oz (84.1 kg)  Height: 6' (1.829 m)   Wt Readings from Last 3 Encounters:  03/10/19 185 lb 6 oz (84.1 kg)  03/01/19 170 lb 11.2 oz (77.4 kg)  09/28/13 205 lb 12.8 oz (93.4 kg)   Lab Results  Component Value Date   CREATININE 1.32 (H) 03/01/2019   CREATININE 1.40 (H) 02/28/2019   CREATININE 1.28 (H) 02/27/2019    Physical Exam Vitals signs and nursing note reviewed.  Constitutional:      Appearance: Normal appearance.  HENT:     Head: Normocephalic and atraumatic.     Right Ear: Decreased hearing noted.     Left Ear: Decreased hearing noted.  Neck:     Musculoskeletal: Normal range of motion  and neck supple.  Cardiovascular:     Rate and Rhythm: Regular rhythm. Tachycardia present.  Pulmonary:     Breath sounds: Rales (lower lobes) present. No wheezing.  Abdominal:     General: There is no distension.     Palpations: Abdomen is soft.  Musculoskeletal:        General: No tenderness.     Right lower leg: Edema (trace pitting) present.     Left lower leg: Edema (trace pitting) present.  Skin:    General: Skin is warm and dry.  Neurological:     General: No focal deficit present.     Mental Status: He is alert and oriented to person, place, and time.  Psychiatric:        Mood and Affect: Mood normal.        Thought Content: Thought content normal.     Assessment & Plan:  1: Chronic heart failure with preserved ejection fraction- - NYHA class II - euvolemic today - not weighing daily as he doesn't have any scales. Set of scales given to him today and he was instructed to weigh every morning, write the weight down and call for an overnight weight gain of >2 pounds or a weekly weight gain of >5 pounds - not adding salt and the importance of closely following a low sodium diet  was discussed with him. Written dietary information was given to him about this - EF is >40% so no indication for entresto - REDS vest reading today was normal at 28% - BNP 02/25/2019 was 873.0 - Pro-BNP 03/09/2019 was elevated at 2403 - has received his flu vaccine - PharmD reconciled medications with the patient - will be having [place] coming in  2: HTN- - BP mildly elevated today so will increase his metoprolol succinate to 100mg  daily - daughter will give him 2 tablets daily; when she has ~ 1 bottle left (he does mail order) she will call so that I can send in a RX for the 100mg  tablet - saw PCP (Olmedo) yesterday - BMP from 03/09/2019 reviewed and showed sodium 139, potassium 4.0, creatinine 1.2 and GFR 54  3: Severe aortic stenosis- - is supposed to be seeing cardiology [facility]) and explained that they are doing virtual visits right now due to COVID-19 - due to severe AS, would not add ACE-  Medication list was reviewed.  Do to COVID-19, will do a virtual visit in 2 weeks or sooner for any questions/problems before then.

## 2019-03-10 NOTE — Telephone Encounter (Signed)
Pt is post hospital follow up, please call and assess if pt needs e visit or in person assessment.

## 2019-03-10 NOTE — Progress Notes (Signed)
[facility] - PHARMACIST COUNSELING NOTE  ADHERENCE ASSESSMENT  Adherence strategy: pill box. Daughter is very active in his care   Do you ever forget to take your medication? [] Yes (1) [x] No (0)  Do you ever skip doses due to side effects? [] Yes (1) [x] No (0)  Do you have trouble affording your medicines? [] Yes (1) [x] No (0)  Are you ever unable to pick up your medication due to transportation difficulties? [] Yes (1) [x] No (0)  Do you ever stop taking your medications because you don't believe they are helping? [] Yes (1) [x] No (0)  Total score _0______    Recommendations given to patient about increasing adherence: None. Patient's daughter is involved in his care.  Guideline-Directed Medical Therapy/Evidence Based Medicine    ACE/ARB/ARNI: none (aortic stenosis)   Beta Blocker: metoprolol succinate 50 mg daily   Aldosterone Antagonist: none (HFpEF) Diuretic: furosemide 40 mg daily    SUBJECTIVE   HPI: Here for initial visit to HF Clinic. He has swelling around his ankles which he says is pretty chronic for him. Denies shortness of breath but still has cough.  Past Medical History:  Diagnosis Date  . A-fib ([place])   . Aortic aneurysm ([place])   . Aortic stenosis   . Asthma   . CHF (congestive heart failure) ([place])   . Hammertoe   . HTN (hypertension)   . TIA (transient ischemic attack)         OBJECTIVE    Vital signs: HR 99, BP 151/84, weight (pounds) 185.6  ECHO: Date 02/25/19, EF 60-65%, notes: severe aortic stenosis    BMP Latest Ref Rng & Units 03/01/2019 02/28/2019 02/27/2019  Glucose 70 - 99 mg/dL 151(H) 144(H) 139(H)  BUN 8 - 23 mg/dL 36(H) 44(H) 46(H)  Creatinine 0.61 - 1.24 mg/dL 1.32(H) 1.40(H) 1.28(H)  Sodium 135 - 145 mmol/L 141 140 141  Potassium 3.5 - 5.1 mmol/L 3.4(L) 3.5 3.5  Chloride 98 - 111 mmol/L 107 107 107  CO2 22 - 32 mmol/L 25 24 24   Calcium 8.9 - 10.3 mg/dL 8.0(L) 8.2(L) 8.5(L)    ASSESSMENT 85  year old male with HFpEF. Patient is going to start weighing daily. He has some swelling in his ankles but says this is chronic for him. He denies shortness of breath. Daughter is very involved with his care. She would like to know if we should switch metoprolol to carvedilol and add additional medication for HF. BP today is elevated.   PLAN EF 60-65%. Will target BP control. Increase metoprolol succinate to 100 mg daily. He will continue all other medications. With severe aortic stenosis, would avoid ACEI/ARB at this time.    Time spent: 10 minutes  [place], Pharm.D. 03/10/2019 12:21 PM    Current Outpatient Medications:  .  acetaminophen (TYLENOL) 500 MG tablet, Take 1,000 mg by mouth every 6 (six) hours as needed for mild pain, moderate pain or fever., Disp: , Rfl:  .  apixaban (ELIQUIS) 5 MG TABS tablet, Take 5 mg by mouth 2 (two) times daily., Disp: , Rfl:  .  aspirin 81 MG tablet, Take 81 mg by mouth daily. , Disp: , Rfl:  .  atorvastatin (LIPITOR) 40 MG tablet, Take 40 mg by mouth daily., Disp: , Rfl:  .  azelastine (ASTELIN) 0.1 % nasal spray, Place 1 spray into both nostrils 2 (two) times daily as needed for rhinitis or allergies. Use in each nostril as directed, Disp: , Rfl:  .  benzonatate (TESSALON) 200 MG capsule, Take 200 mg by mouth 3 (three) times daily as needed for cough., Disp: , Rfl:  .  cetirizine (ZYRTEC) 10 MG tablet, Take 10 mg by mouth daily as needed for allergies., Disp: , Rfl:  .  famotidine (PEPCID) 10 MG tablet, Take 10 mg by mouth daily as needed for heartburn or indigestion., Disp: , Rfl:  .  fluticasone (FLOVENT HFA) 110 MCG/ACT inhaler, Inhale 1 puff into the lungs 2 (two) times daily., Disp: , Rfl:  .  furosemide (LASIX) 40 MG tablet, Take 1 tablet (40 mg total) by mouth daily., Disp: 30 tablet, Rfl: 0 .  guaiFENesin (MUCINEX) 600 MG 12 hr tablet, Take 600 mg by mouth 2 (two) times daily as needed for cough or to loosen phlegm., Disp: , Rfl:  .   levalbuterol (XOPENEX HFA) 45 MCG/ACT inhaler, Inhale 2 puffs into the lungs every 4 (four) hours as needed for wheezing or shortness of breath., Disp: , Rfl:  .  meclizine (ANTIVERT) 12.5 MG tablet, Take 12.5 mg by mouth 3 (three) times daily as needed for dizziness., Disp: , Rfl:  .  metoprolol succinate (TOPROL-XL) 50 MG 24 hr tablet, Take 50 mg by mouth daily. Take with or immediately following a meal., Disp: , Rfl:  .  mirtazapine (REMERON) 7.5 MG tablet, Take 7.5 mg by mouth at bedtime., Disp: , Rfl:  .  multivitamin-iron-minerals-folic acid (CENTRUM) chewable tablet, Chew 1 tablet by mouth daily., Disp: , Rfl:  .  omeprazole (PRILOSEC) 20 MG capsule, Take 20 mg by mouth daily as needed (stomach acid)., Disp: , Rfl:  .  ondansetron (ZOFRAN-ODT) 8 MG disintegrating tablet, Take 8 mg by mouth every 8 (eight) hours as needed for nausea or vomiting., Disp: , Rfl:  .  potassium chloride SA (K-DUR,KLOR-CON) 20 MEQ tablet, Take 1 tablet (20 mEq total) by mouth daily., Disp: 30 tablet, Rfl: 0   COUNSELING POINTS/CLINICAL PEARLS  Metoprolol Succinate (Goal: 200 mg once daily) Warn patient to avoid activities requiring mental alertness or coordination until drug effects are realized, as drug may cause dizziness. Tell patient planning major surgery with anesthesia to alert physician that drug is being used, as drug impairs ability of heart to respond to reflex adrenergic stimuli. Drug may cause diarrhea, fatigue, headache, or depression. Advise diabetic patient to carefully monitor blood glucose as drug may mask symptoms of hypoglycemia. Patient should take extended-release tablet with or immediately following meals. Counsel patient against sudden discontinuation of drug, as this may precipitate hypertension, angina, or myocardial infarction. In the event of a missed dose, counsel patient to skip the missed dose and maintain a regular dosing schedule. Furosemide  Drug causes sun-sensitivity. Advise  patient to use sunscreen and avoid tanning beds. Patient should avoid activities requiring coordination until drug effects are realized, as drug may cause dizziness, vertigo, or blurred vision. This drug may cause hyperglycemia, hyperuricemia, constipation, diarrhea, loss of appetite, nausea, vomiting, purpuric disorder, cramps, spasticity, asthenia, headache, paresthesia, or scaling eczema. Instruct patient to report unusual bleeding/bruising or signs/symptoms of hypotension, infection, pancreatitis, or ototoxicity (tinnitus, hearing impairment). Advise patient to report signs/symptoms of a severe skin reactions (flu-like symptoms, spreading red rash, or skin/mucous membrane blistering) or erythema multiforme. Instruct patient to eat high-potassium foods during drug therapy, as directed by healthcare professional.  Patient should not drink alcohol while taking this drug.   DRUGS TO AVOID IN HEART FAILURE  Drug or Class Mechanism  Analgesics . NSAIDs . COX-2 inhibitors . Glucocorticoids  Sodium and water retention, increased systemic vascular resistance, decreased response to diuretics   Diabetes Medications . Metformin . Thiazolidinediones o Rosiglitazone (Avandia) o Pioglitazone (Actos) . DPP4 Inhibitors o Saxagliptin (Onglyza) o Sitagliptin (Januvia)   Lactic acidosis Possible calcium channel blockade   Unknown  Antiarrhythmics . Class I  o Flecainide o Disopyramide . Class III o Sotalol . Other o Dronedarone  Negative inotrope, proarrhythmic   Proarrhythmic, beta blockade  Negative inotrope  Antihypertensives . Alpha Blockers o Doxazosin . Calcium Channel Blockers o Diltiazem o Verapamil o Nifedipine . Central Alpha Adrenergics o Moxonidine . Peripheral Vasodilators o Minoxidil  Increases renin and aldosterone  Negative inotrope    Possible sympathetic withdrawal  Unknown  Anti-infective . Itraconazole . Amphotericin B  Negative  inotrope Unknown  Hematologic . Anagrelide . Cilostazol   Possible inhibition of PD IV Inhibition of PD III causing arrhythmias  Neurologic/Psychiatric . Stimulants . Anti-Seizure Drugs o Carbamazepine o Pregabalin . Antidepressants o Tricyclics o Citalopram . Parkinsons o Bromocriptine o Pergolide o Pramipexole . Antipsychotics o Clozapine . Antimigraine o Ergotamine o Methysergide . Appetite suppressants . Bipolar o Lithium  Peripheral alpha and beta agonist activity  Negative inotrope and chronotrope Calcium channel blockade  Negative inotrope, proarrhythmic Dose-dependent QT prolongation  Excessive serotonin activity/valvular damage Excessive serotonin activity/valvular damage Unknown  IgE mediated hypersensitivy, calcium channel blockade  Excessive serotonin activity/valvular damage Excessive serotonin activity/valvular damage Valvular damage  Direct myofibrillar degeneration, adrenergic stimulation  Antimalarials . Chloroquine . Hydroxychloroquine Intracellular inhibition of lysosomal enzymes  Urologic Agents . Alpha Blockers o Doxazosin o Prazosin o Tamsulosin o Terazosin  Increased renin and aldosterone  Adapted from Page RL, et al. "Drugs That May Cause or Exacerbate Heart Failure: A Scientific Statement from the [place]." Circulation 2016; 518:A41-Y60. DOI: 10.1161/CIR.0000000000000426   MEDICATION ADHERENCES TIPS AND STRATEGIES 1. Taking medication as prescribed improves patient outcomes in heart failure (reduces hospitalizations, improves symptoms, increases survival) 2. Side effects of medications can be managed by decreasing doses, switching agents, stopping drugs, or adding additional therapy. Please let someone in the [facility] know if you have having bothersome side effects so we can modify your regimen. Do not alter your medication regimen without talking to [place].  3. Medication reminders can help patients  remember to take drugs on time. If you are missing or forgetting doses you can try linking behaviors, using pill boxes, or an electronic reminder like an alarm on your phone or an app. Some people can also get automated phone calls as medication reminders.

## 2019-03-11 ENCOUNTER — Other Ambulatory Visit: Payer: Self-pay | Admitting: Family

## 2019-03-11 MED ORDER — FUROSEMIDE 40 MG PO TABS: 40.0000 mg | ORAL_TABLET | Freq: Every day | ORAL | 3 refills | Status: DC

## 2019-03-11 NOTE — Telephone Encounter (Signed)
Per Helene Kelp note: patient is to do a virtual visit with Dr Rockey Situ in the next 2 weeks.   Routing to Dr Rockey Situ and Jeannene Patella to determine which type is needed.

## 2019-03-13 ENCOUNTER — Ambulatory Visit: Payer: Medicare Other | Admitting: Family

## 2019-03-14 NOTE — Telephone Encounter (Signed)
New patient but needs WEB based evisit

## 2019-03-16 NOTE — Telephone Encounter (Signed)
Spoke with patients daughter as a 1 time verbal per patient.  Patients daughter stated he could not do a video visit nor a telephone visit due to hearing loss.  She stated that home health is coming put twice a week for this week and next week they will be coming out once a week to check on him. He has followed up with [facility].    Please advise.

## 2019-03-17 NOTE — Telephone Encounter (Signed)
I am aware she declined a visit with [place] as well due to hearing issue but states he is doing well

## 2019-03-24 ENCOUNTER — Telehealth: Payer: Medicare Other | Admitting: Family

## 2019-03-31 ENCOUNTER — Telehealth: Payer: Self-pay | Admitting: Family

## 2019-03-31 NOTE — Telephone Encounter (Signed)
Patient's daughter called to say that patient was having a difficult time swallowing potassium tablets. Had a recent potassium level checked on 03/09/2019 which showed a potassium level of 4.0. He had just started taking potassium on 03/10/2019 based on discharge potassium level. He is due to have more lab work drawn on 04/20/2019 and they were wondering if he could stay off potassium until that time due to his great difficulty in swallowing the tablets (even when cut in pieces).   Reviewed last several potassium levels from [facility] and they have ranged from 3.9-4.7. Advised daughter that it would be reasonable to keep him off the potassium supplements until lab work is rechecked on 04/20/2019 and she was comfortable with this plan.

## 2019-04-06 ENCOUNTER — Telehealth: Payer: Self-pay | Admitting: Family

## 2019-04-06 NOTE — Telephone Encounter (Signed)
Patient's daughter, Marlaine Hind, called to report that patient has gained almost 6 pounds in the last week. He weighed 180.6 lbs on 03/31/2019 and today (04/06/2019) he weighed 186 pounds. She says that he's always had swelling in his left leg making it difficult to know if it's worsened or not. He does note being a little more short of breath then usual. She is unsure if he ate anything over the last few days that would have been higher in sodium as her sister was with patient all weekend.   He is currently taking furosemide 40mg  daily. Georgiann Mohs to give him an additional 40mg  furosemide both today and tomorrow and call back in 2 days to update condition. [name] says the home health nurse is supposed to come either today or tomorrow to do an assessment. Georgiann Mohs to have the nurse call me when she's there if there is anything unusual on her exam. [name] was comfortable with this plan.

## 2019-04-09 ENCOUNTER — Telehealth: Payer: Self-pay | Admitting: Family

## 2019-04-09 NOTE — Telephone Encounter (Signed)
Patient's daughter, [name], called back to update on patient's condition. She did give him 80mg  furosemide daily both on 4/20 and 4/21 per our previous discussion. She says that his weight declined from 186 pounds down to 181 pounds yesterday morning.   Patient saw his PCP a couple of days ago due to abdominal pain and daughter says that lab work was drawn. She says that she was told that his potassium was 4.3 but that his renal function had declined (creatinine 2.0 and GFR 29). She says his PCP told him to increase daily fluid intake to 64 ounces daily. [name] is unsure of how much fluid he had been actually drinking during the day up to that point.   After the 2 days of increased diuretic, he resumed taking his regular dosage of 40mg  daily. [name] says that he is scheduled for repeat lab work on 04/20/2019 and will keep [place] posted with those results.

## 2019-04-14 ENCOUNTER — Telehealth: Payer: Self-pay | Admitting: Family

## 2019-04-14 NOTE — Telephone Encounter (Signed)
Returned phone call to patient's daughter, [name], regarding a weight gain over last weekend. She says that her sister called her on Saturday 04/11/2019 to say that the patient had gained 3 pounds overnight. They went ahead and doubled his lasix to 80mg  daily for 2 days and his weight came back down 4 pounds and he's now on his regular 40mg  dose. She says that he's been unable to drink the 64 ounces of water daily that his PCP had recommended.   [name] says that he has an appointment today with his provider as she's concerned about his mental status as he's been getting more forgetful over the last couple of months. She's going to see if they'll check his renal function today instead of next week.

## 2019-04-15 ENCOUNTER — Telehealth: Payer: Self-pay | Admitting: Family

## 2019-04-15 NOTE — Telephone Encounter (Signed)
Patient's daughter called to say that patient saw his PCP earlier today and she reports that his renal function has worsened. She says that his BUN has worsened to 23, creatinine to 2.1 and GFR 27. On 03/01/2019, his GFR was 56. Patient and daughter are concerned that his renal function has declined this much in a little over a month. She says that he's been trying to increase his fluid intake to ~ 60 ounces daily.   She asks if we can make a nephrology referral for evaluation to help guide his treatment. He had to take extra diuretic over last weekend due to weight gain and with his renal function worsening, she is more comfortable if a nephrologist takes a look at things. She also says that his glucose has been worsening and his fasting glucose yesterday was 186.   Will make a referral to [facility] per her request.

## 2019-04-20 ENCOUNTER — Telehealth: Payer: Medicare Other | Admitting: Family

## 2019-04-29 ENCOUNTER — Other Ambulatory Visit: Payer: Self-pay | Admitting: Nephrology

## 2019-04-29 DIAGNOSIS — N183 Chronic kidney disease, stage 3 unspecified: Secondary | ICD-10-CM

## 2019-05-12 ENCOUNTER — Telehealth: Payer: Self-pay | Admitting: Cardiovascular Disease

## 2019-05-12 NOTE — Telephone Encounter (Signed)
We have never formally seen this patient.  Recommend MD visit.

## 2019-05-12 NOTE — Telephone Encounter (Signed)
I put a hold on Dr Tyrell Antonio schedule for 5/28 at 11 am. Can you call patient and see if they can come in that day. Thanks!

## 2019-05-12 NOTE — Telephone Encounter (Signed)
Patient daughter wants patient to be seen for overdue hospital fu   Declined Evisit as patient is [facility] and has hf and kidney failure   Please call daughter

## 2019-05-13 ENCOUNTER — Telehealth: Payer: Self-pay

## 2019-05-13 NOTE — Telephone Encounter (Signed)

## 2019-05-14 ENCOUNTER — Other Ambulatory Visit: Payer: Self-pay

## 2019-05-14 ENCOUNTER — Encounter: Payer: Self-pay | Admitting: Cardiovascular Disease

## 2019-05-14 ENCOUNTER — Ambulatory Visit (INDEPENDENT_AMBULATORY_CARE_PROVIDER_SITE_OTHER): Payer: Medicare Other | Admitting: Cardiovascular Disease

## 2019-05-14 VITALS — BP 128/64 | HR 89 | Temp 99.1°F | Ht 72.0 in | Wt 191.2 lb

## 2019-05-14 DIAGNOSIS — I480 Paroxysmal atrial fibrillation: Secondary | ICD-10-CM | POA: Diagnosis not present

## 2019-05-14 DIAGNOSIS — I10 Essential (primary) hypertension: Secondary | ICD-10-CM

## 2019-05-14 DIAGNOSIS — I359 Nonrheumatic aortic valve disorder, unspecified: Secondary | ICD-10-CM

## 2019-05-14 DIAGNOSIS — I5032 Chronic diastolic (congestive) heart failure: Secondary | ICD-10-CM | POA: Diagnosis not present

## 2019-05-14 NOTE — Progress Notes (Signed)
Cardiology Office Note   Date:  05/14/2019   ID:  Jacob Simpson, DOB [birth year], MRN [id number]  PCP:  Valera Castle, MD  Cardiologist:   [name], MD   Chief Complaint  Patient presents with  . other    Follow up from [facility]; CHF in March 2020. Meds reviewed by the pt. verbally. Pt. c/o shortness of breath with over exertion, cough since March 2020, congestion weakness and fatigue with occas. nasal drainage.       History of Present Illness: Jacob Simpson is a 85 y.o. male who presents for evaluation of aortic stenosis. He has history of chronic diastolic heart failure,  aortic stenosis, essential hypertension and previous tobacco use.  Echocardiogram in March 2020 showed an EF of 60 to 65% with moderate mitral regurgitation and severe aortic stenosis.  Mean gradient was 42 mmHg. He has a history of B-cell lymphoma in remission.  He had TIAs in the past.  There is history of aortic aneurysm measuring 4.3 cm.  He is on anticoagulation with Eliquis for paroxysmal atrial fibrillation. The patient was previously followed by cardiology in [place] but given age and comorbidities, they are transferring care locally.  The patient has been followed there for paroxysmal atrial fibrillation and moderate to severe aortic stenosis.  His most recent echocardiogram there was in the moderately severe range and thus he has not been evaluated for aortic valve replacement.  He also has been dealing with progressive memory loss but overall continues to be functional.  He lives by himself.  He is able to drive but has cut down on that.  He walks with a cane.  He is accompanied today with by his daughter. He had recurrent pulmonary infection this winter. He was hospitalized at [facility] in March with acute on chronic diastolic heart failure.  His echocardiogram showed severe aortic stenosis as outlined above. The patient describes dyspnea with minimal exertion with occasional substernal tightness.  He  takes furosemide regularly.  Past Medical History:  Diagnosis Date  . A-fib ([place])   . Aortic aneurysm ([place])   . Aortic stenosis   . Asthma   . CHF (congestive heart failure) ([place])   . Hammertoe   . Heart murmur   . HTN (hypertension)   . TIA (transient ischemic attack)     Past Surgical History:  Procedure Laterality Date  . BACK SURGERY  12/89  . CATARACT EXTRACTION W/ INTRAOCULAR LENS IMPLANT Right 12/00  . CATARACT EXTRACTION W/ INTRAOCULAR LENS IMPLANT Left 8/01  . foot sugery Right 6/03  . FOOT SURGERY Left 1/96   hammertoe  . KIDNEY STONE SURGERY  7/90  . ROTATOR CUFF REPAIR Right 1/98  . TOTAL KNEE ARTHROPLASTY Bilateral 1987     Current Outpatient Medications  Medication Sig Dispense Refill  . acetaminophen (TYLENOL) 500 MG tablet Take 1,000 mg by mouth every 6 (six) hours as needed for mild pain, moderate pain or fever.    Marland Kitchen apixaban (ELIQUIS) 5 MG TABS tablet Take 5 mg by mouth 2 (two) times daily.    Marland Kitchen atorvastatin (LIPITOR) 40 MG tablet Take 40 mg by mouth daily.    Marland Kitchen azelastine (ASTELIN) 0.1 % nasal spray Place 1 spray into both nostrils 2 (two) times daily as needed for rhinitis or allergies. Use in each nostril as directed    . cetirizine (ZYRTEC) 10 MG tablet Take 10 mg by mouth daily as needed for allergies.    . famotidine (PEPCID) 10  MG tablet Take 10 mg by mouth daily as needed for heartburn or indigestion.    . fluticasone (FLOVENT HFA) 110 MCG/ACT inhaler Inhale 1 puff into the lungs 2 (two) times daily.    . furosemide (LASIX) 40 MG tablet Take 1 tablet (40 mg total) by mouth daily. 90 tablet 3  . guaiFENesin (MUCINEX) 600 MG 12 hr tablet Take 600 mg by mouth 2 (two) times daily as needed for cough or to loosen phlegm.    . meclizine (ANTIVERT) 12.5 MG tablet Take 12.5 mg by mouth 3 (three) times daily as needed for dizziness.    . metoprolol succinate (TOPROL-XL) 50 MG 24 hr tablet Take 100 mg by mouth daily. Take with or immediately following a meal.      . mirtazapine (REMERON) 7.5 MG tablet Take 7.5 mg by mouth at bedtime.    Marland Kitchen omeprazole (PRILOSEC) 20 MG capsule Take 20 mg by mouth daily as needed (stomach acid).    . ondansetron (ZOFRAN-ODT) 8 MG disintegrating tablet Take 8 mg by mouth every 8 (eight) hours as needed for nausea or vomiting.     No current facility-administered medications for this visit.     Allergies:   Penicillins    Social History:  The patient  reports that he has quit smoking. He has never used smokeless tobacco. He reports that he does not drink alcohol or use drugs.   Family History:  The patient's family history includes Heart disease in his father.    ROS:  Please see the history of present illness.   Otherwise, review of systems are positive for none.   All other systems are reviewed and negative.    PHYSICAL EXAM: VS:  BP 128/64 (BP Location: Right Arm, Patient Position: Sitting, Cuff Size: Normal)   Pulse 89   Temp 99.1 F (37.3 C) Comment: 97.7 took a second time  Ht 6' (1.829 m)   Wt 191 lb 4 oz (86.8 kg)   SpO2 98%   BMI 25.94 kg/m  , BMI Body mass index is 25.94 kg/m. GEN: Well nourished, well developed, in no acute distress  HEENT: normal  Neck: no JVD, carotid bruits, or masses Cardiac: RRR; no  rubs, or gallops.  3 out of 6 crescendo decrescendo systolic murmur in the aortic area which is late peaking with absent S2.  There is mild bilateral leg edema Respiratory:  clear to auscultation bilaterally, normal work of breathing GI: soft, nontender, nondistended, + BS MS: no deformity or atrophy  Skin: warm and dry, no rash Neuro:  Strength and sensation are intact Psych: euthymic mood, full affect   EKG:  EKG is not ordered today. I reviewed his EKG done in March which showed sinus tachycardia with LVH and repolarization abnormalities   Recent Labs: 02/25/2019: B Natriuretic Peptide 873.0 02/28/2019: Hemoglobin 13.7; Platelets 128 03/01/2019: BUN 36; Creatinine, Ser 1.32; Potassium  3.4; Sodium 141    Lipid Panel No results found for: CHOL, TRIG, HDL, CHOLHDL, VLDL, LDLCALC, LDLDIRECT    Wt Readings from Last 3 Encounters:  05/14/19 191 lb 4 oz (86.8 kg)  03/10/19 185 lb 6 oz (84.1 kg)  03/01/19 170 lb 11.2 oz (77.4 kg)       No flowsheet data found.    ASSESSMENT AND PLAN:  1.  Chronic diastolic heart failure: He seems to be euvolemic on current dose of furosemide.  I made no changes.  2.  Severe aortic stenosis: I discussed the natural history and management of  severe aortic stenosis.  Most likely, his heart failure is due to this and thus prognosis is poor without aortic valve replacement.  The patient is a marginal candidate for TAVR given his recent memory issues.  He is going to be seen by neurology next month and it might make sense to wait until their evaluation before making decision regarding TAVR.  3.  Essential hypertension: Blood pressure is reasonably controlled.  Avoid aggressive lowering of blood pressure given severe aortic stenosis  3.  Paroxysmal atrial fibrillation: Currently on anticoagulation with Eliquis.  I discontinued aspirin.  Disposition:   FU with me in 6 weeks  Signed,  [name], MD  05/14/2019 12:13 PM    [place]

## 2019-05-14 NOTE — Patient Instructions (Signed)
Medication Instructions:  Your physician has recommended you make the following change in your medication:   STOP Aspirin  If you need a refill on your cardiac medications before your next appointment, please call your pharmacy.   Lab work: None ordered If you have labs (blood work) drawn today and your tests are completely normal, you will receive your results only by: Marland Kitchen MyChart Message (if you have MyChart) OR . A paper copy in the mail If you have any lab test that is abnormal or we need to change your treatment, we will call you to review the results.  Testing/Procedures: None ordered  Follow-Up: At [facility], you and your health needs are our priority.  As part of our continuing mission to provide you with exceptional heart care, we have created designated Provider Care Teams.  These Care Teams include your primary Cardiologist (physician) and Advanced Practice Providers (APPs -  Physician Assistants and Nurse Practitioners) who all work together to provide you with the care you need, when you need it. You will need a follow up appointment in 6 weeks.  You may see  Dr. Fletcher Anon or one of the following Advanced Practice Providers on your designated Care Team:   Murray Hodgkins, NP Christell Faith, PA-C . Marrianne Mood, PA-C

## 2019-06-01 ENCOUNTER — Ambulatory Visit
Admission: RE | Admit: 2019-06-01 | Discharge: 2019-06-01 | Disposition: A | Discharge status: Home / Self Care | Payer: Medicare Other | Source: Ambulatory Visit | Attending: Nephrology | Admitting: Nephrology

## 2019-06-01 ENCOUNTER — Other Ambulatory Visit: Payer: Self-pay

## 2019-06-01 DIAGNOSIS — N183 Chronic kidney disease, stage 3 unspecified: Secondary | ICD-10-CM

## 2019-06-05 ENCOUNTER — Encounter: Payer: Self-pay | Admitting: Cardiovascular Disease

## 2019-06-16 ENCOUNTER — Other Ambulatory Visit: Payer: Self-pay | Admitting: Neurology

## 2019-06-16 DIAGNOSIS — R413 Other amnesia: Secondary | ICD-10-CM

## 2019-06-24 ENCOUNTER — Telehealth: Payer: Self-pay | Admitting: Cardiovascular Disease

## 2019-06-24 NOTE — Telephone Encounter (Signed)

## 2019-06-25 ENCOUNTER — Ambulatory Visit (INDEPENDENT_AMBULATORY_CARE_PROVIDER_SITE_OTHER): Payer: Medicare Other | Admitting: Cardiovascular Disease

## 2019-06-25 ENCOUNTER — Encounter: Payer: Self-pay | Admitting: Cardiovascular Disease

## 2019-06-25 ENCOUNTER — Other Ambulatory Visit: Payer: Self-pay

## 2019-06-25 VITALS — BP 140/80 | HR 83 | Temp 97.0°F | Ht 70.0 in | Wt 193.0 lb

## 2019-06-25 DIAGNOSIS — I480 Paroxysmal atrial fibrillation: Secondary | ICD-10-CM | POA: Diagnosis not present

## 2019-06-25 DIAGNOSIS — I10 Essential (primary) hypertension: Secondary | ICD-10-CM

## 2019-06-25 DIAGNOSIS — I5032 Chronic diastolic (congestive) heart failure: Secondary | ICD-10-CM

## 2019-06-25 DIAGNOSIS — I350 Nonrheumatic aortic (valve) stenosis: Secondary | ICD-10-CM | POA: Diagnosis not present

## 2019-06-25 MED ORDER — APIXABAN 2.5 MG PO TABS: 2.5000 mg | ORAL_TABLET | Freq: Two times a day (BID) | ORAL | Status: DC

## 2019-06-25 NOTE — Patient Instructions (Signed)
Medication Instructions:  Your physician has recommended you make the following change in your medication:   REDUCE Eliquis to 2.5mg  twice daily  If you need a refill on your cardiac medications before your next appointment, please call your pharmacy.   Lab work: None ordered If you have labs (blood work) drawn today and your tests are completely normal, you will receive your results only by: Marland Kitchen MyChart Message (if you have MyChart) OR . A paper copy in the mail If you have any lab test that is abnormal or we need to change your treatment, we will call you to review the results.  Testing/Procedures: None ordered  Follow-Up: At [facility], you and your health needs are our priority.  As part of our continuing mission to provide you with exceptional heart care, we have created designated Provider Care Teams.  These Care Teams include your primary Cardiologist (physician) and Advanced Practice Providers (APPs -  Physician Assistants and Nurse Practitioners) who all work together to provide you with the care you need, when you need it. You will need a follow up appointment to be determined after TAVR consult     You may see Dr.Arida or one of the following Advanced Practice Providers on your designated Care Team:   Murray Hodgkins, NP Christell Faith, PA-C . Marrianne Mood, PA-C  Any Other Special Instructions Will Be Listed Below (If Applicable). You have been referred to Structural Heart/Valve clinic. Lauren, RN will contact to schedule the consult.

## 2019-06-25 NOTE — Progress Notes (Signed)
Cardiology Office Note   Date:  06/25/2019   ID:  Jacob Simpson, DOB Feb 02, 1930, MRN [id number]  PCP:  Valera Castle, MD  Cardiologist:   [name], MD   Chief Complaint  Patient presents with  . other    6 week follow up. Meds reviewed by the pt. verbally. "doing well."       History of Present Illness: Jacob Simpson is a 85 y.o. male who presents for a follow-up visit regarding severe aortic stenosis.   He has history of chronic diastolic heart failure, paroxysmal atrial fibrillation, aortic stenosis, essential hypertension, history of B-cell lymphoma in remission, chronic kidney disease, previous TIA, aortic aneurysm measuring 4.3 cm and previous tobacco use.  In addition, he had mild decline in memory. The patient was previously followed by cardiology in [place] but recently it felt transfer to [place].    He was hospitalized at [facility] in March with acute on chronic diastolic heart failure.  Echocardiogram  showed an EF of 60 to 65% with moderate mitral regurgitation and severe aortic stenosis.  Mean gradient was 42 mmHg with valve area of 0.37.  The patient was evaluated by neurology regarding his memory but I do not have that note.  It appears that he has mild memory issues as he continues to be alert and oriented x3.  He has been very active and functional throughout his life but definitely declined over the last 6 to 12 months.  He has good social supports from his children.  His daughter is accompanying him today.  He continues to be limited by shortness of breath with minimal exertion and fatigue.  Past Medical History:  Diagnosis Date  . A-fib ([place])   . Aortic aneurysm ([place])   . Aortic stenosis   . Asthma   . CHF (congestive heart failure) ([place])   . Hammertoe   . Heart murmur   . HTN (hypertension)   . TIA (transient ischemic attack)     Past Surgical History:  Procedure Laterality Date  . BACK SURGERY  12/89  . CATARACT EXTRACTION W/ INTRAOCULAR LENS  IMPLANT Right 12/00  . CATARACT EXTRACTION W/ INTRAOCULAR LENS IMPLANT Left 8/01  . foot sugery Right 6/03  . FOOT SURGERY Left 1/96   hammertoe  . KIDNEY STONE SURGERY  7/90  . ROTATOR CUFF REPAIR Right 1/98  . TOTAL KNEE ARTHROPLASTY Bilateral 1987     Current Outpatient Medications  Medication Sig Dispense Refill  . acetaminophen (TYLENOL) 500 MG tablet Take 1,000 mg by mouth every 6 (six) hours as needed for mild pain, moderate pain or fever.    Marland Kitchen apixaban (ELIQUIS) 2.5 MG TABS tablet Take 1 tablet (2.5 mg total) by mouth 2 (two) times daily.    Marland Kitchen atorvastatin (LIPITOR) 40 MG tablet Take 40 mg by mouth daily.    Marland Kitchen azelastine (ASTELIN) 0.1 % nasal spray Place 1 spray into both nostrils 2 (two) times daily as needed for rhinitis or allergies. Use in each nostril as directed    . cetirizine (ZYRTEC) 10 MG tablet Take 10 mg by mouth daily as needed for allergies.    . famotidine (PEPCID) 10 MG tablet Take 10 mg by mouth daily as needed for heartburn or indigestion.    . fluticasone (FLOVENT HFA) 110 MCG/ACT inhaler Inhale 1 puff into the lungs 2 (two) times daily.    . furosemide (LASIX) 40 MG tablet Take 1 tablet (40 mg total) by mouth daily. 90 tablet 3  .  guaiFENesin (MUCINEX) 600 MG 12 hr tablet Take 600 mg by mouth 2 (two) times daily as needed for cough or to loosen phlegm.    . meclizine (ANTIVERT) 12.5 MG tablet Take 12.5 mg by mouth 3 (three) times daily as needed for dizziness.    . metoprolol succinate (TOPROL-XL) 50 MG 24 hr tablet Take 100 mg by mouth daily. Take with or immediately following a meal.     . mirtazapine (REMERON) 7.5 MG tablet Take 7.5 mg by mouth at bedtime.    Marland Kitchen omeprazole (PRILOSEC) 20 MG capsule Take 20 mg by mouth daily as needed (stomach acid).    . ondansetron (ZOFRAN-ODT) 8 MG disintegrating tablet Take 8 mg by mouth every 8 (eight) hours as needed for nausea or vomiting.     No current facility-administered medications for this visit.     Allergies:    Penicillins    Social History:  The patient  reports that he has quit smoking. He has never used smokeless tobacco. He reports that he does not drink alcohol or use drugs.   Family History:  The patient's family history includes Heart disease in his father.    ROS:  Please see the history of present illness.   Otherwise, review of systems are positive for none.   All other systems are reviewed and negative.    PHYSICAL EXAM: VS:  BP 140/80 (BP Location: Right Arm, Patient Position: Sitting, Cuff Size: Normal)   Pulse 83   Temp (!) 97 F (36.1 C)   Ht 5\' 10"  (1.778 m)   Wt 193 lb (87.5 kg)   BMI 27.69 kg/m  , BMI Body mass index is 27.69 kg/m. GEN: Well nourished, well developed, in no acute distress  HEENT: normal  Neck: no JVD, carotid bruits, or masses Cardiac: RRR; no  rubs, or gallops.  3 /6 crescendo decrescendo systolic murmur in the aortic area which is late peaking with absent S2.  There is mild bilateral leg edema Respiratory:  clear to auscultation bilaterally, normal work of breathing GI: soft, nontender, nondistended, + BS MS: no deformity or atrophy  Skin: warm and dry, no rash Neuro:  Strength and sensation are intact Psych: euthymic mood, full affect   EKG:  EKG is  ordered today. EKG showed normal sinus rhythm with possible left atrial enlargement, LVH with repolarization abnormalities.   Recent Labs: 02/25/2019: B Natriuretic Peptide 873.0 02/28/2019: Hemoglobin 13.7; Platelets 128 03/01/2019: BUN 36; Creatinine, Ser 1.32; Potassium 3.4; Sodium 141    Lipid Panel No results found for: CHOL, TRIG, HDL, CHOLHDL, VLDL, LDLCALC, LDLDIRECT    Wt Readings from Last 3 Encounters:  06/25/19 193 lb (87.5 kg)  05/14/19 191 lb 4 oz (86.8 kg)  03/10/19 185 lb 6 oz (84.1 kg)       No flowsheet data found.    ASSESSMENT AND PLAN:  1.  Chronic diastolic heart failure: He seems to be euvolemic on current dose of furosemide.  I made no changes.  His most  recent creatinine was 1.7.  Peak creatinine was 2.  2.  Severe aortic stenosis: I again discussed the natural history and management of severe aortic stenosis.  Given his age and multiple comorbidities, he is a marginal candidate for TAVR but I still think that is probably his best option given that he is trying to maintain independence as much as possible.  His memory issues do not seem to be major as he continues to be oriented and he has good  understanding of his medical conditions.   I am going to refer him to the TAVR clinic for evaluation of this.  Underlying chronic kidney disease also complicates the issue as well.   He will require a right and left cardiac catheterization which we can do here at [facility] once the patient and family commit to proceeding.    3.  Essential hypertension: Blood pressure is reasonably controlled.  Avoid aggressive lowering of blood pressure given severe aortic stenosis  3.  Paroxysmal atrial fibrillation: Currently on anticoagulation with Eliquis.  Given age and creatinine above 1.5, I decreased Eliquis to 2.5 mg twice daily.  Disposition:   FU with me based on TAVR evaluation.  Signed,  [name], MD  06/25/2019 1:15 PM    [facility]

## 2019-06-30 ENCOUNTER — Ambulatory Visit: Admission: RE | Admit: 2019-06-30 | Payer: Medicare Other | Source: Ambulatory Visit

## 2019-06-30 ENCOUNTER — Ambulatory Visit
Admission: RE | Admit: 2019-06-30 | Discharge: 2019-06-30 | Disposition: A | Discharge status: Home / Self Care | Payer: Medicare Other | Source: Ambulatory Visit | Attending: Neurology | Admitting: Neurology

## 2019-06-30 ENCOUNTER — Other Ambulatory Visit: Payer: Self-pay

## 2019-06-30 DIAGNOSIS — R413 Other amnesia: Secondary | ICD-10-CM | POA: Insufficient documentation

## 2019-06-30 IMAGING — MR MRI HEAD WITHOUT CONTRAST
9 of 11 series · 35 of 48 positions shown · non-contrast
Comparison: None.

CLINICAL DATA: Memory loss

EXAM:
MRI HEAD WITHOUT CONTRAST
TECHNIQUE: Multiplanar, multiecho pulse sequences of the brain and surrounding
structures were obtained without intravenous contrast.

[Series 5: ax dwi_tracew · axial · 3.0mm · 0.60mm/px · z∈[-62,+92]mm · 6 of 48 slices shown]
[im 1/48]
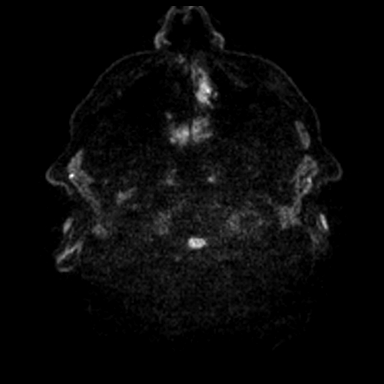
[im 10/48]
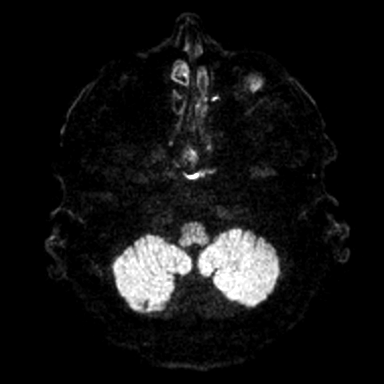
[im 19/48]
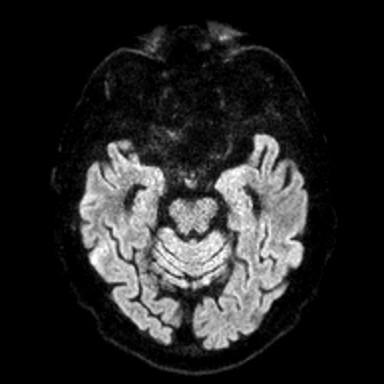
[im 29/48]
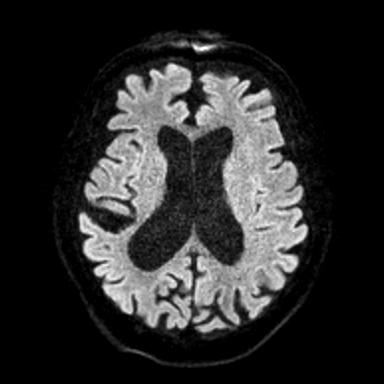
[im 38/48]
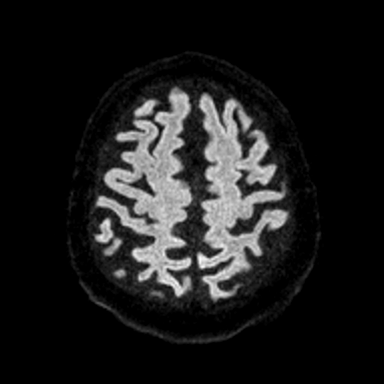
[im 48/48]
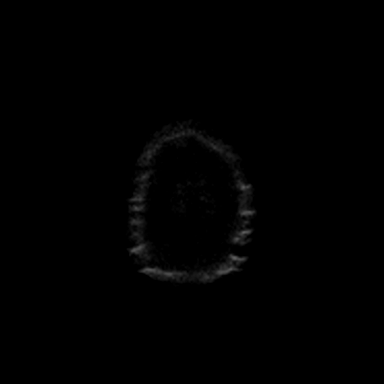

[Series 6: ax dwi_adc · axial · 3.0mm · 0.60mm/px · z∈[-62,+92]mm · 6 of 48 slices shown]
[im 1/48]
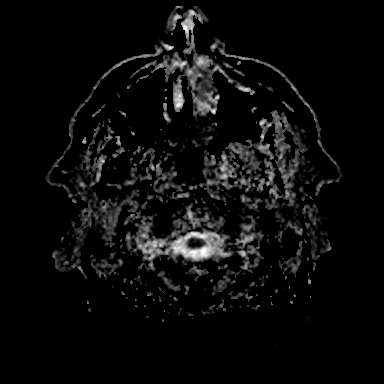
[im 10/48]
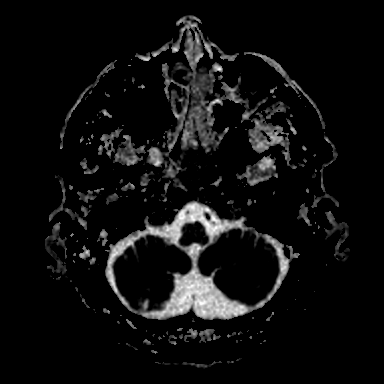
[im 19/48]
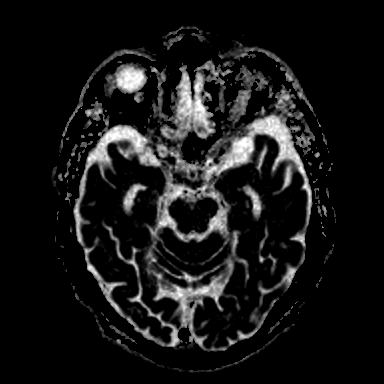
[im 29/48]
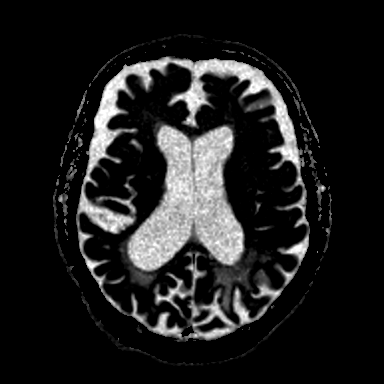
[im 38/48]
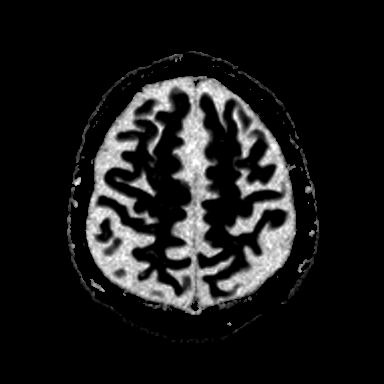
[im 48/48]
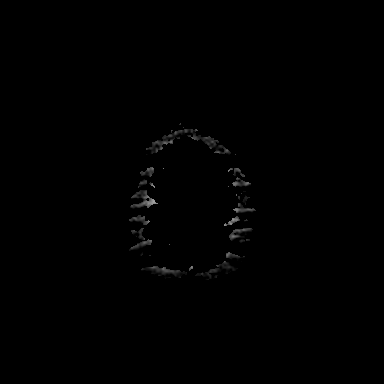

[Series 7: cor dwi_tracew · coronal · 5.0mm · 0.60mm/px · 4 of 38 slices shown]
[im 1/38]
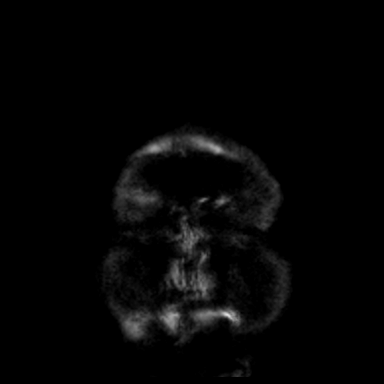
[im 13/38]
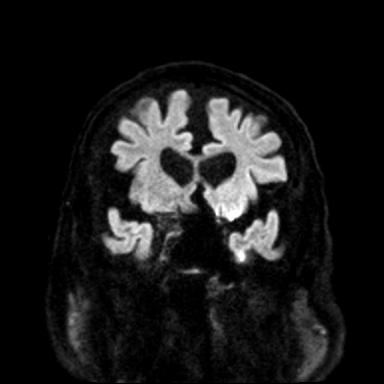
[im 25/38]
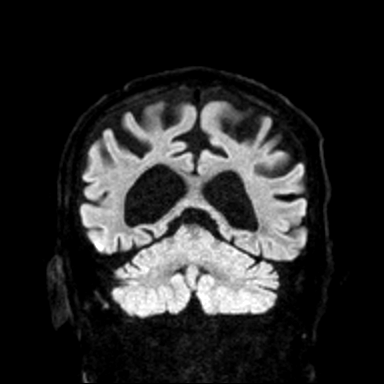
[im 38/38]
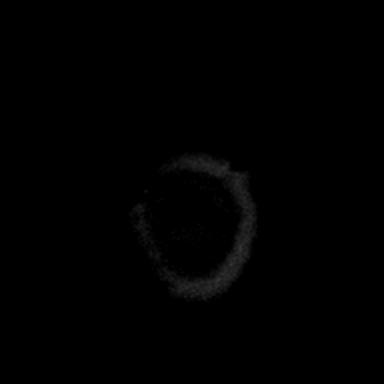

[Series 8: cor dwi_adc · coronal · 5.0mm · 0.60mm/px · 3 of 38 slices shown]
[im 1/38]
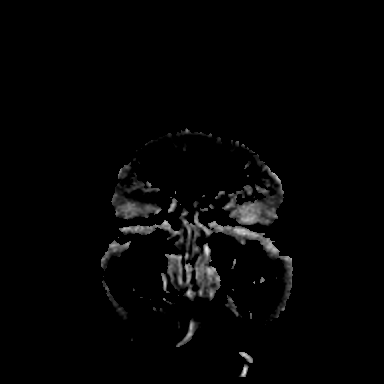
[im 13/38]
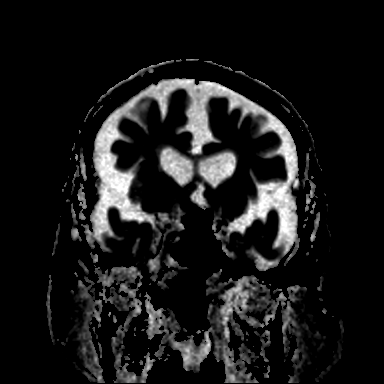
[im 25/38]
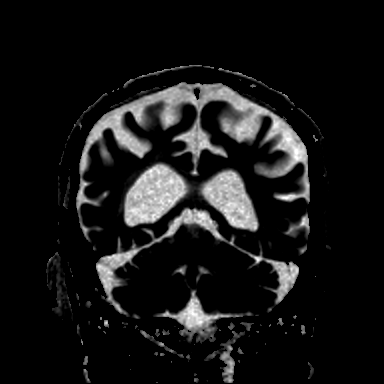

[Series 9: T1 · sagittal · 5.0mm · 0.62mm/px · 3 of 23 slices shown (1 of 2)]
[im 1/23]
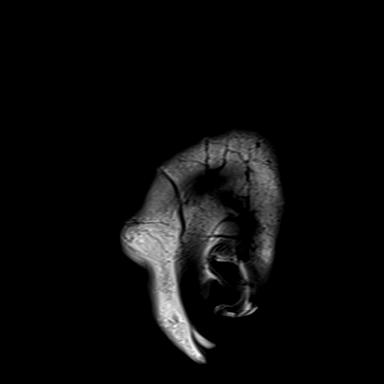
[im 12/23]
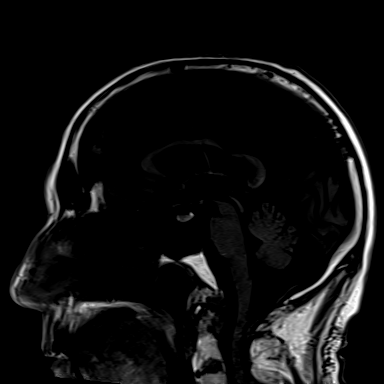
[im 23/23]
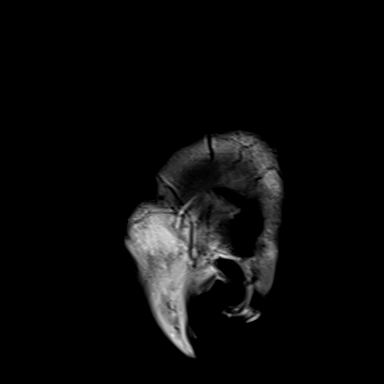

[Series 10: T2 · axial · 5.0mm · 0.53mm/px · z∈[-62,+92]mm · 3 of 27 slices shown (1 of 2)]
[im 1/27]
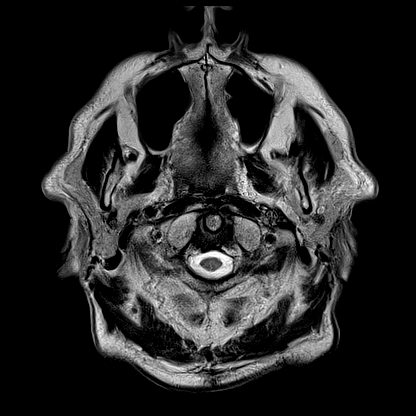
[im 14/27]
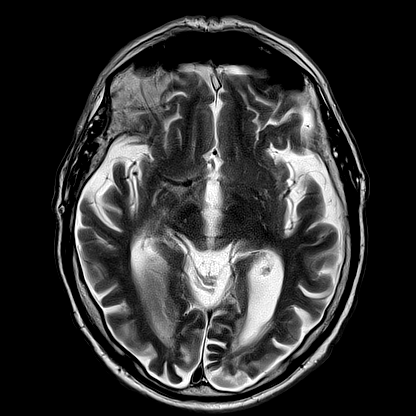
[im 27/27]
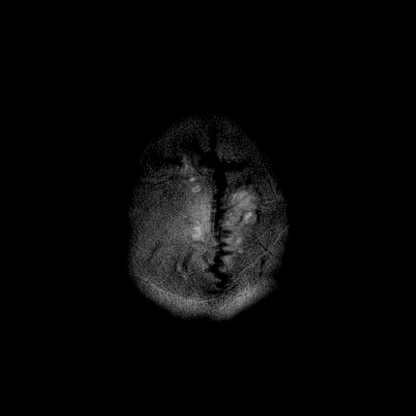

[Series 19: FLAIR · axial · 5.0mm · 1.20mm/px · z∈[-80,+74]mm · 3 of 27 slices shown]
[im 1/27]
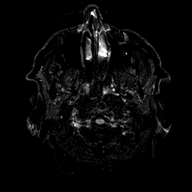
[im 14/27]
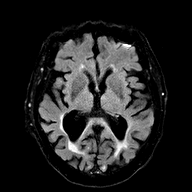
[im 27/27]
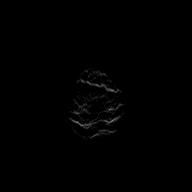

[Series 20: T1 · axial · 5.0mm · 0.90mm/px · z∈[-80,+73]mm · 3 of 27 slices shown (2 of 2)]
[im 1/27]
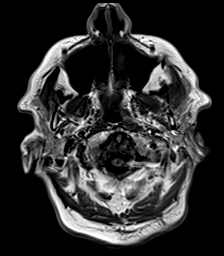
[im 14/27]
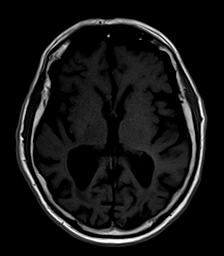
[im 27/27]
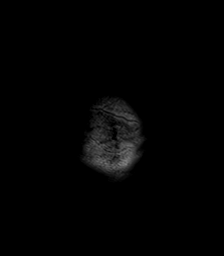

[Series 21: T2 · coronal · 5.0mm · 0.45mm/px · 4 of 31 slices shown (2 of 2)]
[im 1/31]
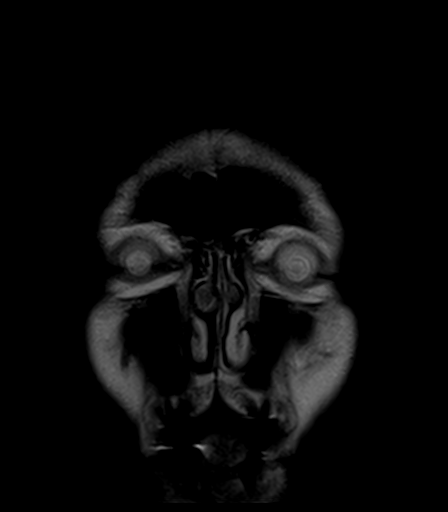
[im 11/31]
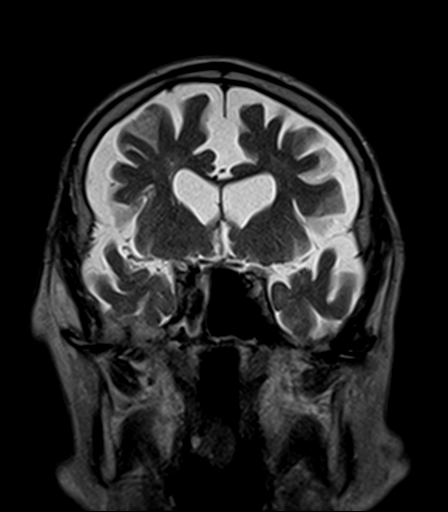
[im 21/31]
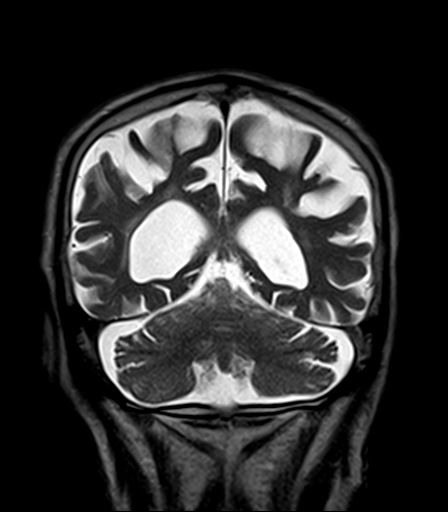
[im 31/31]
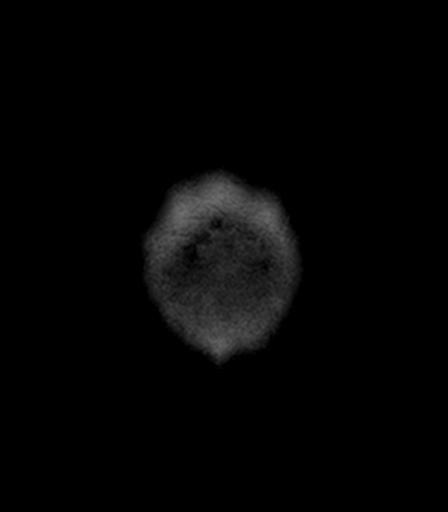

[35 of 48 positions shown; findings below may reference images not displayed]

FINDINGS: Brain: Prominent cortical atrophy most notable in the parietal
lobes, although advanced diffusely. No focal or notable medial
temporal volume loss.

Small remote bilateral cerebellar infarcts.

Superficial siderosis in the posterior fossa. Few remote white
matter and/or cortex micro hemorrhages along the right cerebral
convexity which may be from prior watershed ischemic event.

Periventricular ischemic gliosis greatest at the occipital lobes.
Remote left occipital and parietal cortex infarcts, small volume.

No mass or collection.

Vascular: Attenuated flow void in the distal left cervical ICA and
left cavernous sinus with reconstitution at the carotid terminus.

Skull and upper cervical spine: Negative for marrow lesion

Sinuses/Orbits: Bilateral cataract resection. Chronic right mid
sphenoid sinusitis with inspissated material centrally.
IMPRESSION: 1. No reversible finding.
2. Advanced cortical atrophy with mild parietal predilection.
3. Attenuated left ICA suggesting high-grade stenosis in the neck.
4. Chronic ischemic injury as described.
5. Superficial siderosis in the posterior fossa.

## 2019-07-03 ENCOUNTER — Telehealth: Payer: Self-pay | Admitting: *Deleted

## 2019-07-03 NOTE — Telephone Encounter (Signed)
   Appointment July 20,2020 with Dr. Angelena Form  COVID-19 Pre-Screening Questions:  . In the past 7 to 10 days have you had a cough,  shortness of breath, headache, congestion, fever (100 or greater) body aches, chills, sore throat, or sudden loss of taste or sense of smell? no . Have you been around anyone with known Covid 19. no . Have you been around anyone who is awaiting Covid 19 test results in the past 7 to 10 days? no . Have you been around anyone who has been exposed to Covid 19, or has mentioned symptoms of Covid 19 within the past 7 to 10 days? no  If you have any concerns/questions about symptoms patients report during screening (either on the phone or at threshold). Contact the provider seeing the patient or DOD for further guidance.  If neither are available contact a member of the leadership team.      I spoke with pt's daughter regarding above questions. She will accompany pt to appointment and both she and pt answer no to screening questions. Daughter aware she and pt will need to wear mask to appointment. Daughter aware to arrive 15 minutes prior to appointment

## 2019-07-05 ENCOUNTER — Emergency Department: Payer: Medicare Other

## 2019-07-05 ENCOUNTER — Other Ambulatory Visit: Payer: Self-pay

## 2019-07-05 ENCOUNTER — Inpatient Hospital Stay
Admission: EM | Admit: 2019-07-05 | Discharge: 2019-07-08 | DRG: 314 | Disposition: A | Discharge status: Home Health | Payer: Medicare Other | Attending: Internal Medicine | Admitting: Internal Medicine

## 2019-07-05 DIAGNOSIS — Z981 Arthrodesis status: Secondary | ICD-10-CM | POA: Diagnosis not present

## 2019-07-05 DIAGNOSIS — I480 Paroxysmal atrial fibrillation: Secondary | ICD-10-CM | POA: Diagnosis present

## 2019-07-05 DIAGNOSIS — Z7901 Long term (current) use of anticoagulants: Secondary | ICD-10-CM

## 2019-07-05 DIAGNOSIS — Z96653 Presence of artificial knee joint, bilateral: Secondary | ICD-10-CM | POA: Diagnosis present

## 2019-07-05 DIAGNOSIS — I340 Nonrheumatic mitral (valve) insufficiency: Secondary | ICD-10-CM | POA: Diagnosis not present

## 2019-07-05 DIAGNOSIS — Z961 Presence of intraocular lens: Secondary | ICD-10-CM | POA: Diagnosis present

## 2019-07-05 DIAGNOSIS — N183 Chronic kidney disease, stage 3 (moderate): Secondary | ICD-10-CM | POA: Diagnosis present

## 2019-07-05 DIAGNOSIS — N400 Enlarged prostate without lower urinary tract symptoms: Secondary | ICD-10-CM | POA: Diagnosis present

## 2019-07-05 DIAGNOSIS — E860 Dehydration: Secondary | ICD-10-CM | POA: Diagnosis present

## 2019-07-05 DIAGNOSIS — I351 Nonrheumatic aortic (valve) insufficiency: Secondary | ICD-10-CM | POA: Diagnosis not present

## 2019-07-05 DIAGNOSIS — Z87891 Personal history of nicotine dependence: Secondary | ICD-10-CM

## 2019-07-05 DIAGNOSIS — M545 Low back pain, unspecified: Secondary | ICD-10-CM

## 2019-07-05 DIAGNOSIS — I130 Hypertensive heart and chronic kidney disease with heart failure and stage 1 through stage 4 chronic kidney disease, or unspecified chronic kidney disease: Secondary | ICD-10-CM | POA: Diagnosis present

## 2019-07-05 DIAGNOSIS — I959 Hypotension, unspecified: Secondary | ICD-10-CM | POA: Diagnosis present

## 2019-07-05 DIAGNOSIS — Z1159 Encounter for screening for other viral diseases: Secondary | ICD-10-CM

## 2019-07-05 DIAGNOSIS — E861 Hypovolemia: Secondary | ICD-10-CM | POA: Diagnosis not present

## 2019-07-05 DIAGNOSIS — Z9841 Cataract extraction status, right eye: Secondary | ICD-10-CM | POA: Diagnosis not present

## 2019-07-05 DIAGNOSIS — Z9842 Cataract extraction status, left eye: Secondary | ICD-10-CM

## 2019-07-05 DIAGNOSIS — Z880 Allergy status to penicillin: Secondary | ICD-10-CM

## 2019-07-05 DIAGNOSIS — I350 Nonrheumatic aortic (valve) stenosis: Secondary | ICD-10-CM | POA: Diagnosis present

## 2019-07-05 DIAGNOSIS — D696 Thrombocytopenia, unspecified: Secondary | ICD-10-CM | POA: Diagnosis present

## 2019-07-05 DIAGNOSIS — N179 Acute kidney failure, unspecified: Secondary | ICD-10-CM

## 2019-07-05 DIAGNOSIS — J45909 Unspecified asthma, uncomplicated: Secondary | ICD-10-CM | POA: Diagnosis present

## 2019-07-05 DIAGNOSIS — I493 Ventricular premature depolarization: Secondary | ICD-10-CM | POA: Diagnosis present

## 2019-07-05 DIAGNOSIS — R531 Weakness: Secondary | ICD-10-CM | POA: Diagnosis present

## 2019-07-05 DIAGNOSIS — I5032 Chronic diastolic (congestive) heart failure: Secondary | ICD-10-CM | POA: Diagnosis present

## 2019-07-05 DIAGNOSIS — Z7951 Long term (current) use of inhaled steroids: Secondary | ICD-10-CM

## 2019-07-05 DIAGNOSIS — N170 Acute kidney failure with tubular necrosis: Secondary | ICD-10-CM | POA: Diagnosis present

## 2019-07-05 DIAGNOSIS — R05 Cough: Secondary | ICD-10-CM

## 2019-07-05 DIAGNOSIS — D631 Anemia in chronic kidney disease: Secondary | ICD-10-CM | POA: Diagnosis present

## 2019-07-05 DIAGNOSIS — Z87442 Personal history of urinary calculi: Secondary | ICD-10-CM

## 2019-07-05 DIAGNOSIS — E785 Hyperlipidemia, unspecified: Secondary | ICD-10-CM | POA: Diagnosis present

## 2019-07-05 DIAGNOSIS — R413 Other amnesia: Secondary | ICD-10-CM | POA: Diagnosis present

## 2019-07-05 DIAGNOSIS — Z79899 Other long term (current) drug therapy: Secondary | ICD-10-CM

## 2019-07-05 DIAGNOSIS — M48061 Spinal stenosis, lumbar region without neurogenic claudication: Secondary | ICD-10-CM | POA: Diagnosis present

## 2019-07-05 DIAGNOSIS — Z8249 Family history of ischemic heart disease and other diseases of the circulatory system: Secondary | ICD-10-CM | POA: Diagnosis not present

## 2019-07-05 DIAGNOSIS — Z8572 Personal history of non-Hodgkin lymphomas: Secondary | ICD-10-CM

## 2019-07-05 DIAGNOSIS — R059 Cough, unspecified: Secondary | ICD-10-CM

## 2019-07-05 DIAGNOSIS — I361 Nonrheumatic tricuspid (valve) insufficiency: Secondary | ICD-10-CM | POA: Diagnosis not present

## 2019-07-05 DIAGNOSIS — I9589 Other hypotension: Secondary | ICD-10-CM | POA: Diagnosis not present

## 2019-07-05 DIAGNOSIS — Z8673 Personal history of transient ischemic attack (TIA), and cerebral infarction without residual deficits: Secondary | ICD-10-CM

## 2019-07-05 DIAGNOSIS — I272 Pulmonary hypertension, unspecified: Secondary | ICD-10-CM | POA: Diagnosis present

## 2019-07-05 DIAGNOSIS — H9190 Unspecified hearing loss, unspecified ear: Secondary | ICD-10-CM | POA: Diagnosis present

## 2019-07-05 LAB — CBC WITH DIFFERENTIAL/PLATELET
Abs Immature Granulocytes: 0.02 10*3/uL (ref 0.00–0.07)
Basophils Absolute: 0.1 10*3/uL (ref 0.0–0.1)
Basophils Relative: 1 %
Eosinophils Absolute: 0.5 10*3/uL (ref 0.0–0.5)
Eosinophils Relative: 7 %
HCT: 35 % — ABNORMAL LOW (ref 39.0–52.0)
Hemoglobin: 11.6 g/dL — ABNORMAL LOW (ref 13.0–17.0)
Immature Granulocytes: 0 %
Lymphocytes Relative: 22 %
Lymphs Abs: 1.7 10*3/uL (ref 0.7–4.0)
MCH: 31.4 pg (ref 26.0–34.0)
MCHC: 33.1 g/dL (ref 30.0–36.0)
MCV: 94.9 fL (ref 80.0–100.0)
Monocytes Absolute: 0.7 10*3/uL (ref 0.1–1.0)
Monocytes Relative: 9 %
Neutro Abs: 4.6 10*3/uL (ref 1.7–7.7)
Neutrophils Relative %: 61 %
Platelets: 95 10*3/uL — ABNORMAL LOW (ref 150–400)
RBC: 3.69 MIL/uL — ABNORMAL LOW (ref 4.22–5.81)
RDW: 12.4 % (ref 11.5–15.5)
WBC: 7.5 10*3/uL (ref 4.0–10.5)
nRBC: 0 % (ref 0.0–0.2)

## 2019-07-05 LAB — URINALYSIS, COMPLETE (UACMP) WITH MICROSCOPIC
Bacteria, UA: NONE SEEN
Glucose, UA: NEGATIVE mg/dL
Hgb urine dipstick: NEGATIVE
Ketones, ur: 5 mg/dL — AB
Leukocytes,Ua: NEGATIVE
Nitrite: NEGATIVE
Protein, ur: 100 mg/dL — AB
Specific Gravity, Urine: 1.025 (ref 1.005–1.030)
pH: 5 (ref 5.0–8.0)

## 2019-07-05 LAB — BASIC METABOLIC PANEL
Anion gap: 9 (ref 5–15)
BUN: 36 mg/dL — ABNORMAL HIGH (ref 8–23)
CO2: 20 mmol/L — ABNORMAL LOW (ref 22–32)
Calcium: 7.8 mg/dL — ABNORMAL LOW (ref 8.9–10.3)
Chloride: 110 mmol/L (ref 98–111)
Creatinine, Ser: 2.21 mg/dL — ABNORMAL HIGH (ref 0.61–1.24)
GFR calc Af Amer: 30 mL/min — ABNORMAL LOW (ref >60)
GFR calc non Af Amer: 26 mL/min — ABNORMAL LOW (ref >60)
Glucose, Bld: 188 mg/dL — ABNORMAL HIGH (ref 70–99)
Potassium: 3.9 mmol/L (ref 3.5–5.1)
Sodium: 139 mmol/L (ref 135–145)

## 2019-07-05 LAB — TROPONIN I (HIGH SENSITIVITY)
Troponin I (High Sensitivity): 51 ng/L — ABNORMAL HIGH (ref <18)
Troponin I (High Sensitivity): 53 ng/L — ABNORMAL HIGH (ref <18)

## 2019-07-05 LAB — IRON AND TIBC
Iron: 59 ug/dL (ref 45–182)
Saturation Ratios: 17 % — ABNORMAL LOW (ref 17.9–39.5)
TIBC: 343 ug/dL (ref 250–450)
UIBC: 284 ug/dL

## 2019-07-05 LAB — GLUCOSE, CAPILLARY: Glucose-Capillary: 74 mg/dL (ref 70–99)

## 2019-07-05 LAB — LACTIC ACID, PLASMA: Lactic Acid, Venous: 1.4 mmol/L (ref 0.5–1.9)

## 2019-07-05 LAB — FOLATE: Folate: 10.9 ng/mL (ref >5.9)

## 2019-07-05 LAB — FERRITIN: Ferritin: 67 ng/mL (ref 24–336)

## 2019-07-05 IMAGING — CR CHEST - 2 VIEW
1 series · 2 of 2 positions shown · non-contrast
Comparison: [DATE].

CLINICAL DATA: Weakness.

EXAM:
CHEST - 2 VIEW

[Series 1: dg chest 2 view · 0.14mm/px · 2 of 2 slices shown]
[im 1/2]
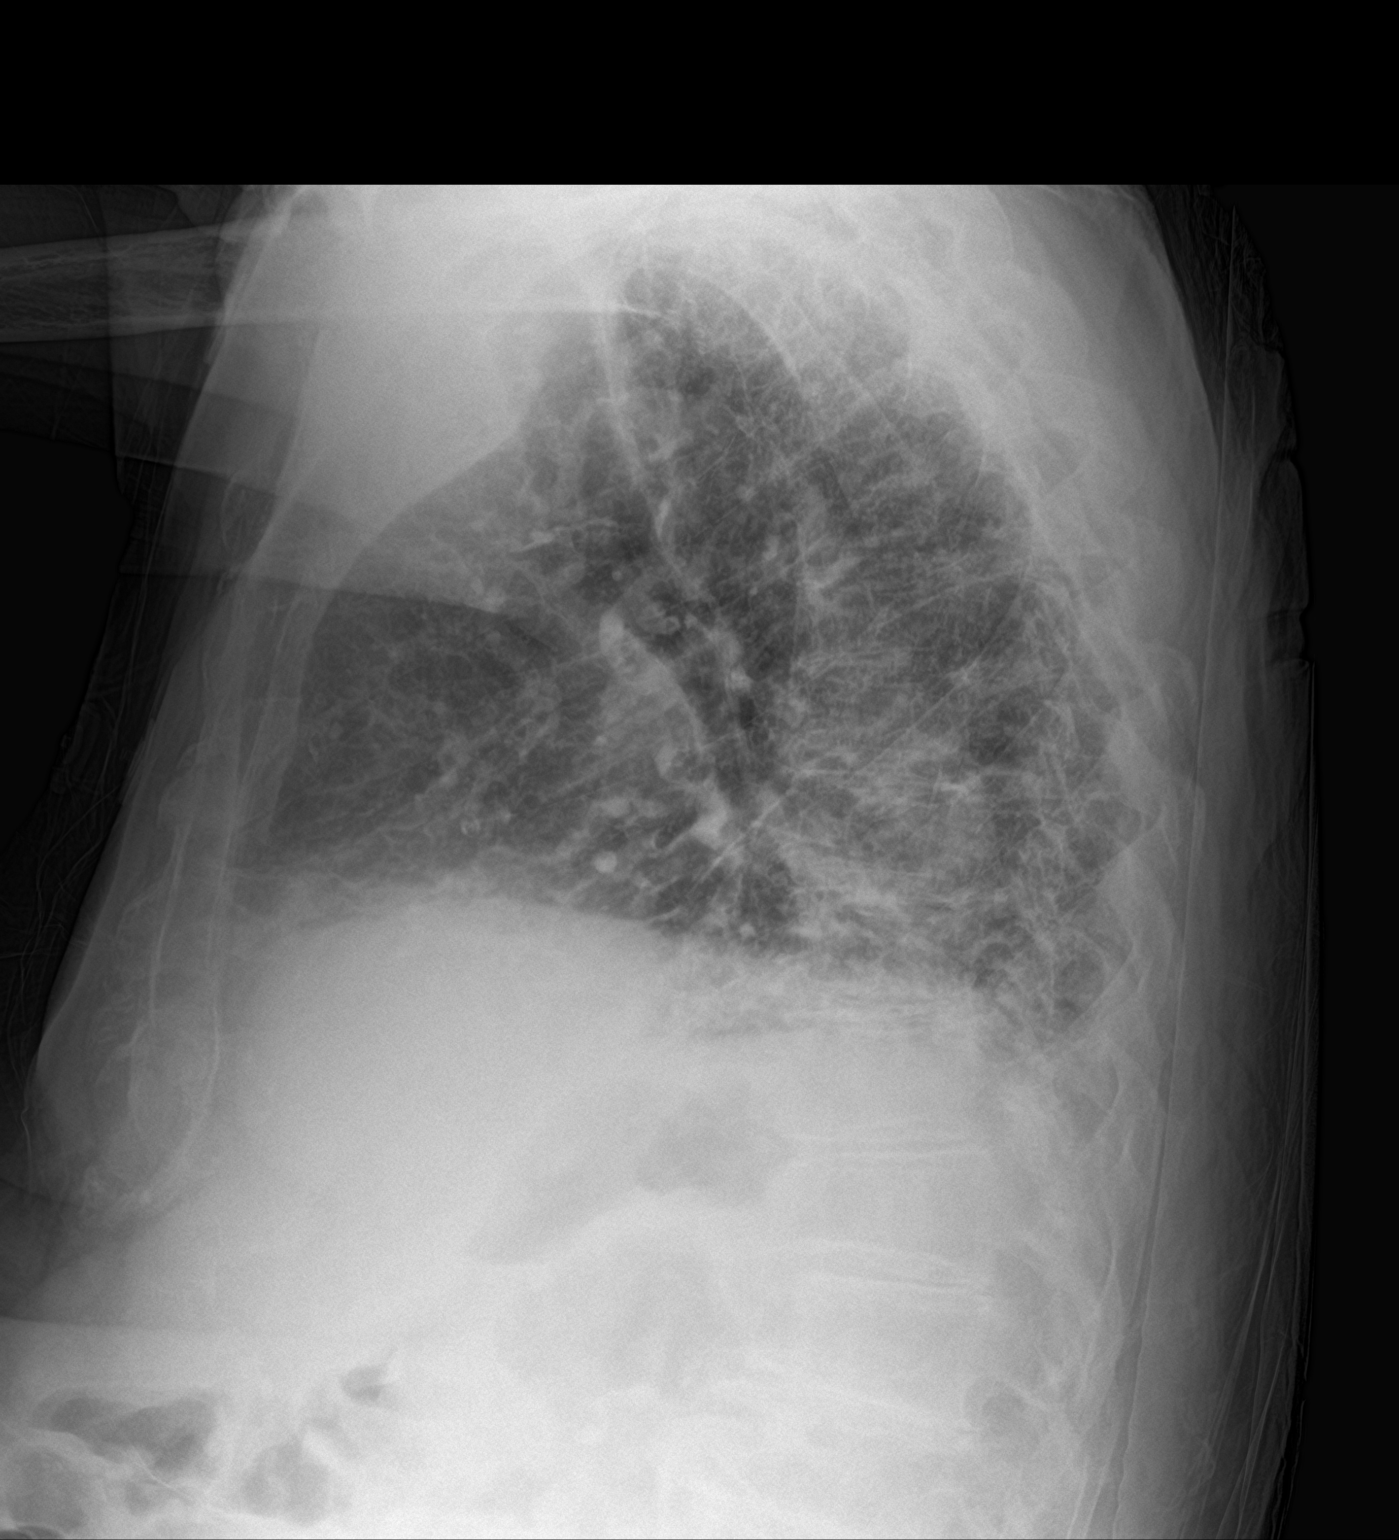
[im 2/2]
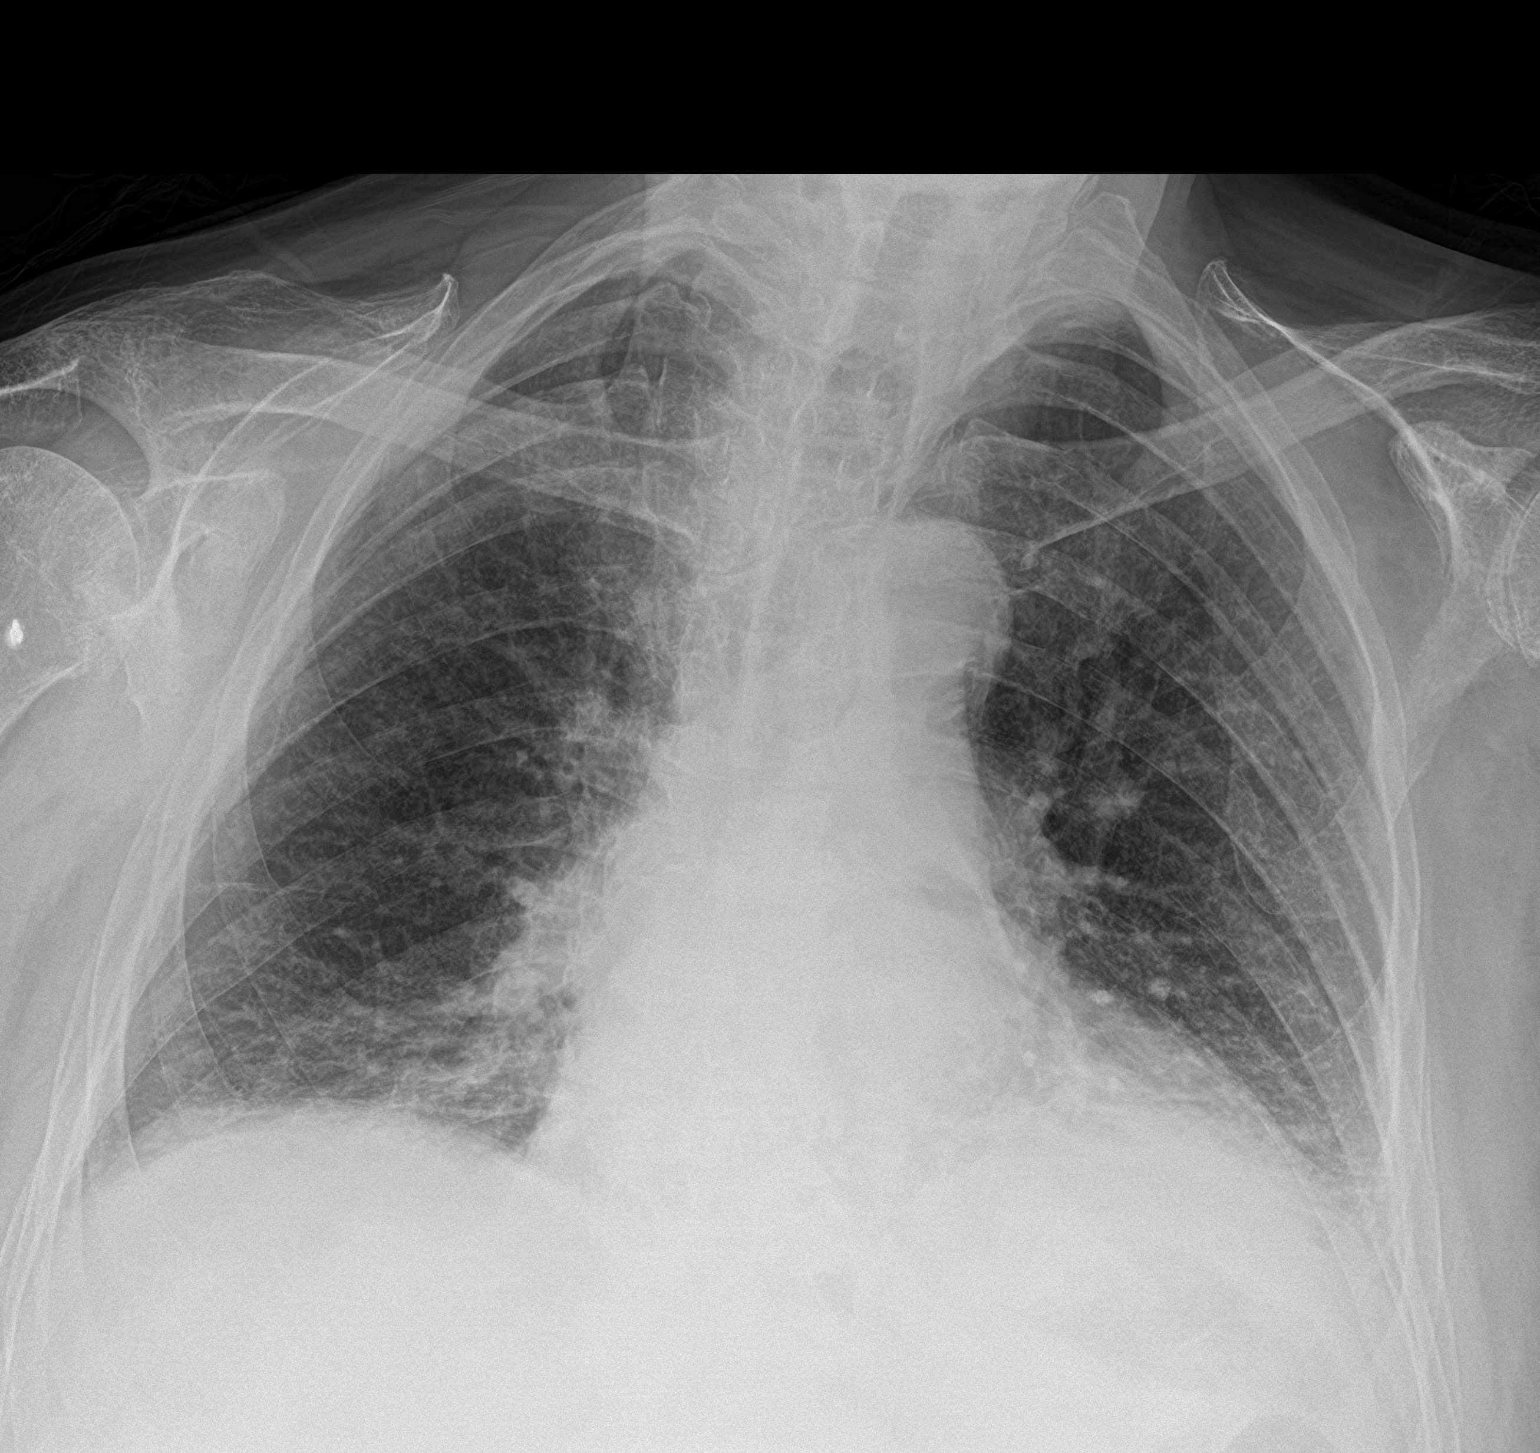

[2 of 2 positions shown; findings below may reference images not displayed]

FINDINGS: Stable mildly enlarged cardiac silhouette and prominent pulmonary
vasculature. Mild increase in prominence of the interstitial
markings with Kerley lines. No definite pleural fluid. Mild left
basilar and minimal right basilar atelectasis. Diffuse osteopenia.
Left shoulder degenerative changes.
IMPRESSION: 1. Probable mild interstitial pulmonary edema with stable pulmonary
vascular congestion and mild cardiomegaly.
2. Mild left basilar atelectasis and minimal right basilar
atelectasis.

## 2019-07-05 IMAGING — CR LUMBAR SPINE - 2-3 VIEW
1 series · 3 of 3 positions shown · non-contrast
Comparison: Abdomen series dated [DATE].

CLINICAL DATA: Low back pain and weakness.

EXAM:
LUMBAR SPINE - 2-3 VIEW

[Series 1: dg lumbar spine 2-3 views · 0.14mm/px · 3 of 3 slices shown]
[im 1/3]
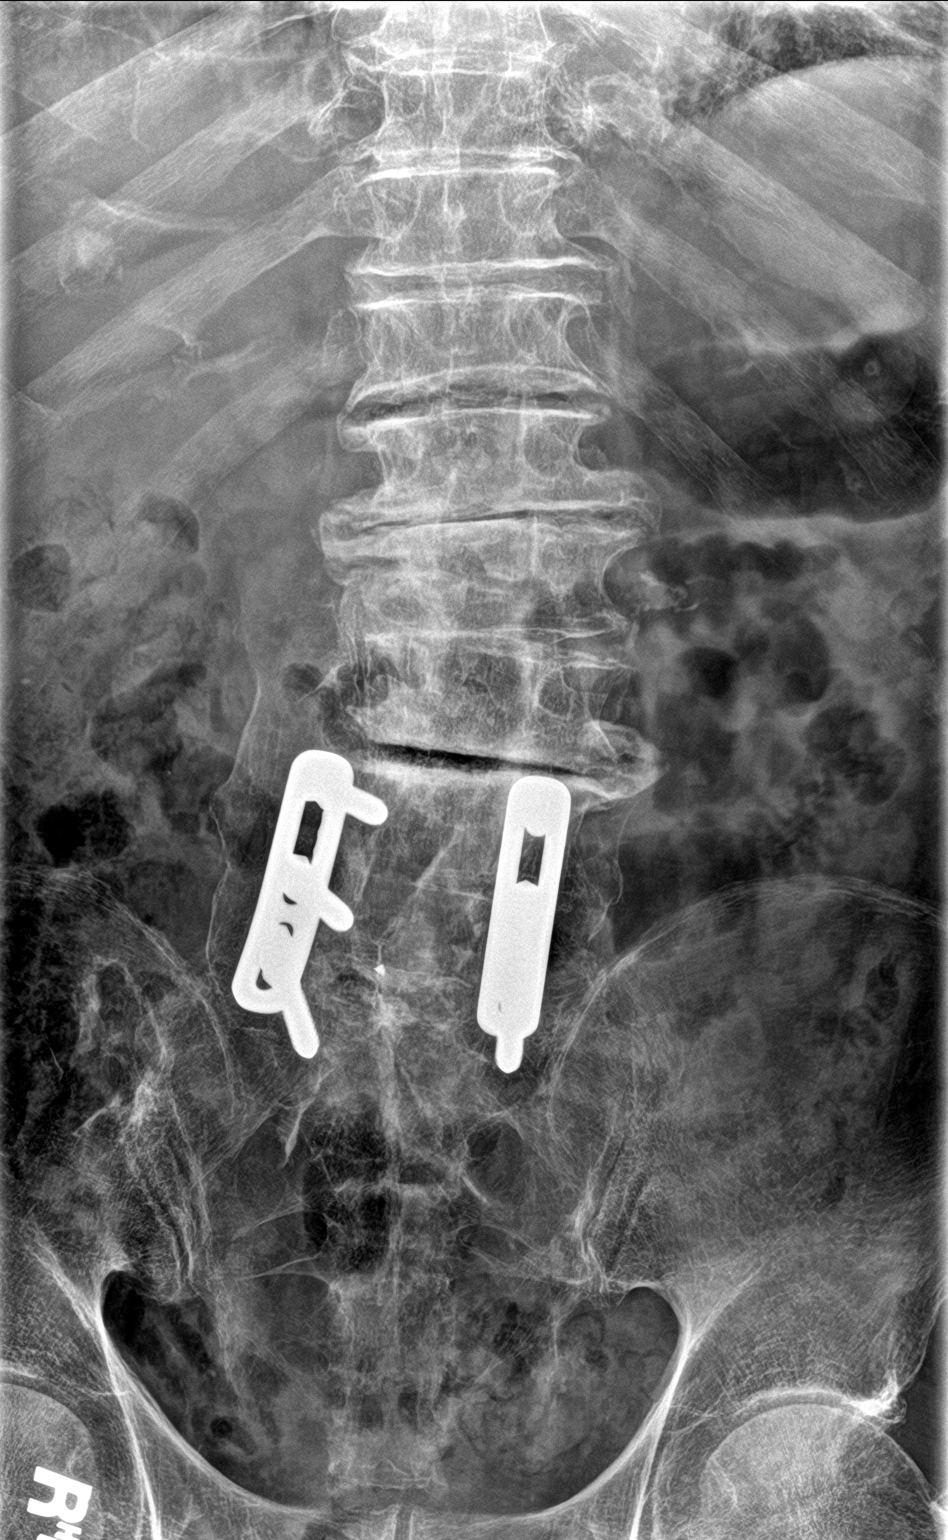
[im 2/3]
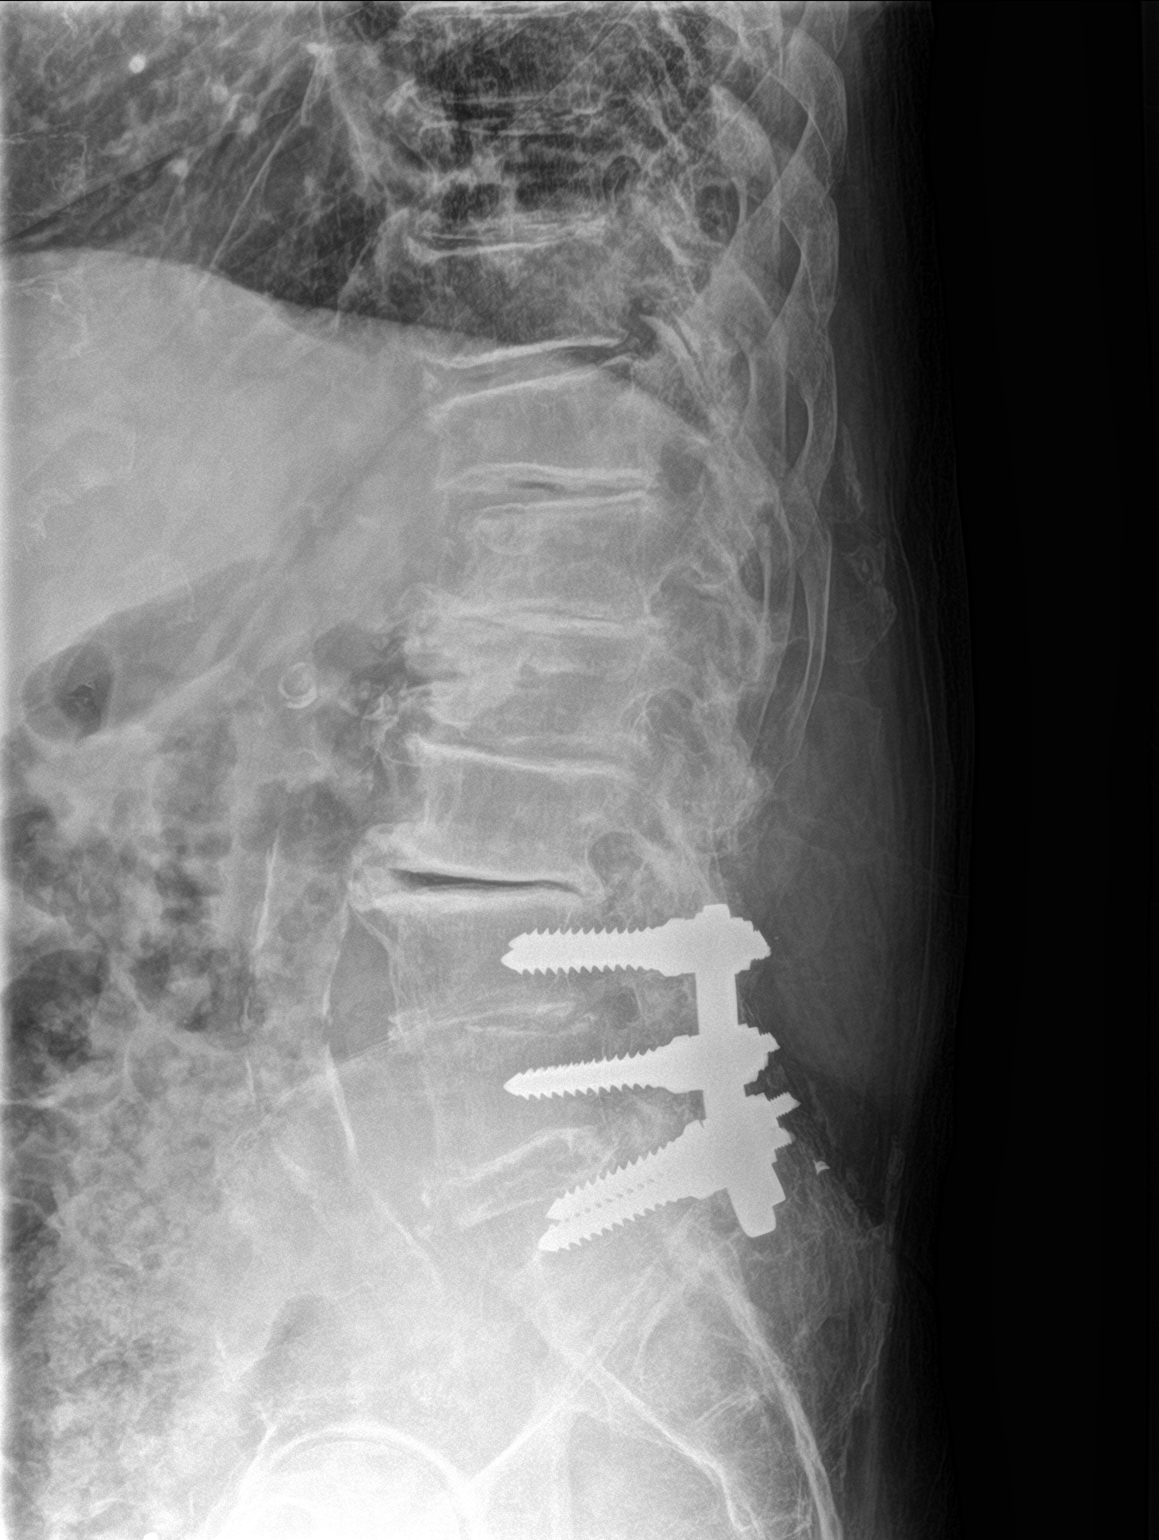
[im 3/3]
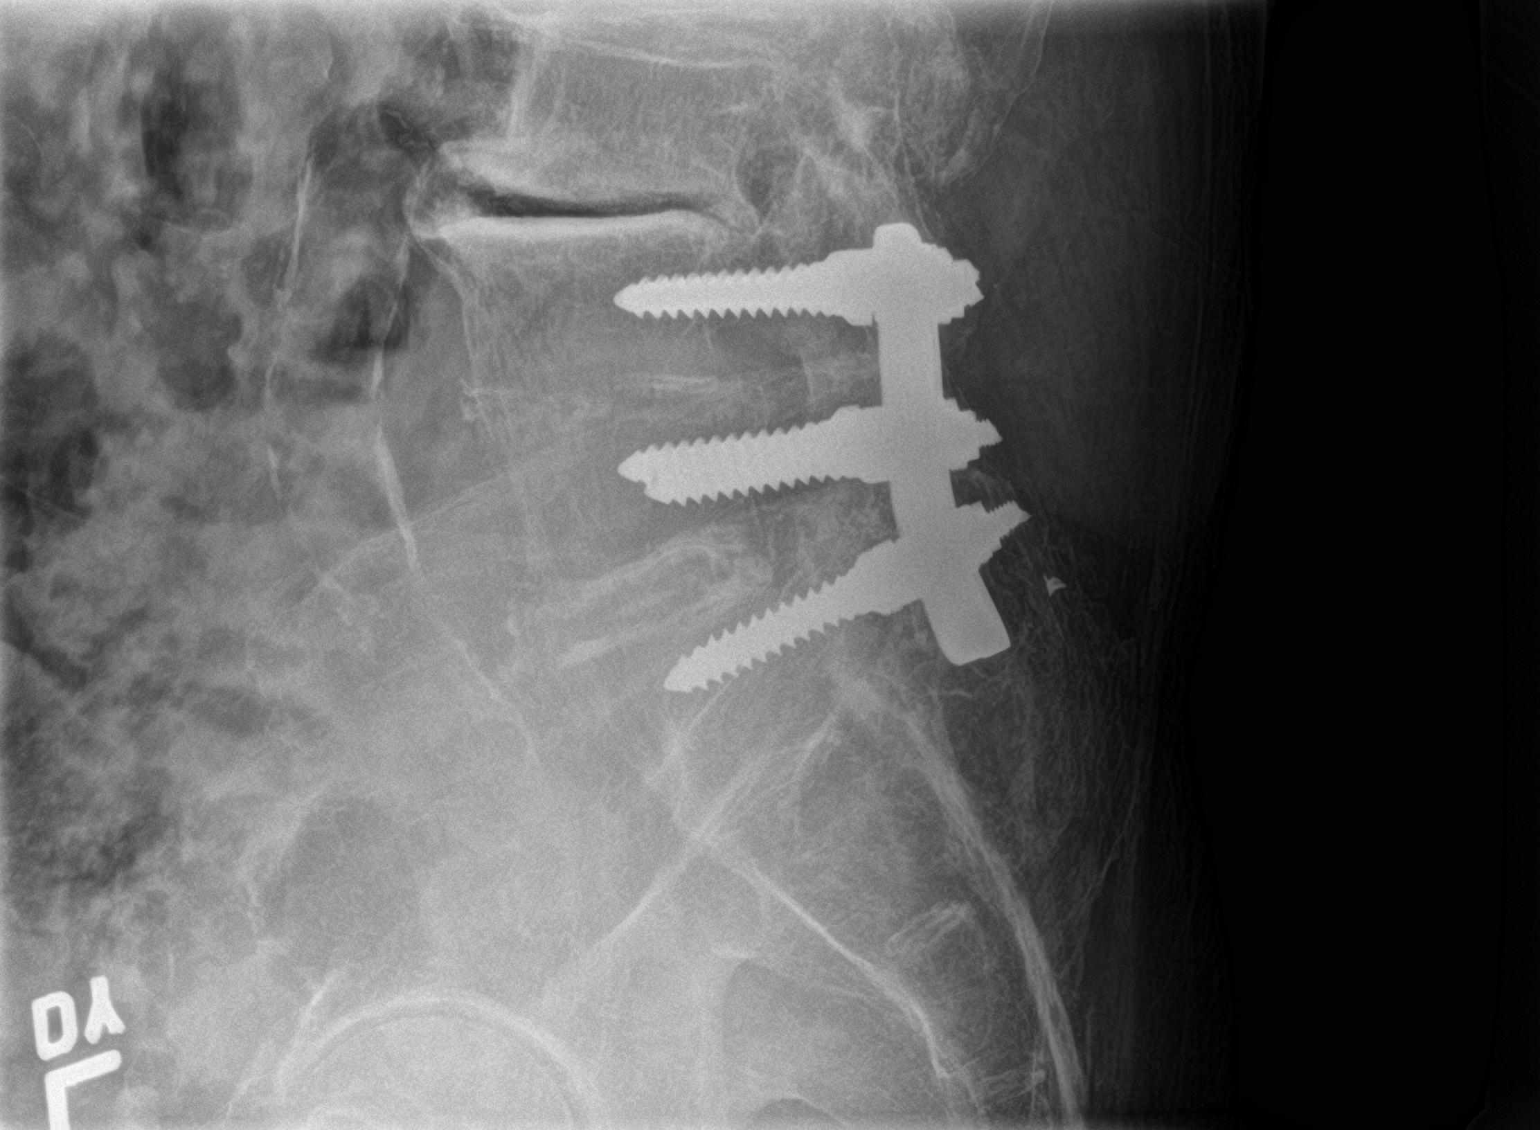

[3 of 3 positions shown; findings below may reference images not displayed]

FINDINGS: Five non-rib-bearing lumbar vertebrae. Stable screw and plate fusion
at the L4 through S1 levels with bone fusion on the right from the
S1 level to the mid L2 level and on the left from the S1 level to
the L3-4 level. Extensive lumbar and lower thoracic spine
degenerative changes are again demonstrated. Approximately 20% T12
compression deformity with no acute fracture lines or bony
retropulsion without gross change in the frontal projection. No
acute fractures are visualized. Diffuse osteopenia. Atheromatous
arterial calcifications.
IMPRESSION: 1. No acute abnormality.
2. Stable postsurgical and degenerative changes.

## 2019-07-05 MED ORDER — ONDANSETRON HCL 4 MG/2ML IJ SOLN: 4.0000 mg | Freq: Four times a day (QID) | INTRAMUSCULAR | Status: DC | PRN

## 2019-07-05 MED ORDER — GABAPENTIN 100 MG PO CAPS: 100.0000 mg | ORAL_CAPSULE | Freq: Three times a day (TID) | ORAL | Status: DC | PRN

## 2019-07-05 MED ORDER — FLUTICASONE PROPIONATE HFA 110 MCG/ACT IN AERO
1.0000 | INHALATION_SPRAY | Freq: Two times a day (BID) | RESPIRATORY_TRACT | Status: DC | PRN
  Filled 2019-07-05: qty 12

## 2019-07-05 MED ORDER — APIXABAN 2.5 MG PO TABS
2.5000 mg | ORAL_TABLET | Freq: Two times a day (BID) | ORAL | Status: DC
  Administered 2019-07-05 – 2019-07-08 (×6): 2.5 mg via ORAL
  Filled 2019-07-05 (×6): qty 1

## 2019-07-05 MED ORDER — ONDANSETRON HCL 4 MG PO TABS: 4.0000 mg | ORAL_TABLET | Freq: Four times a day (QID) | ORAL | Status: DC | PRN

## 2019-07-05 MED ORDER — IPRATROPIUM-ALBUTEROL 0.5-2.5 (3) MG/3ML IN SOLN
3.0000 mL | Freq: Four times a day (QID) | RESPIRATORY_TRACT | Status: DC
  Administered 2019-07-05: 3 mL via RESPIRATORY_TRACT
  Filled 2019-07-05 (×2): qty 3

## 2019-07-05 MED ORDER — PANTOPRAZOLE SODIUM 40 MG PO TBEC
40.0000 mg | DELAYED_RELEASE_TABLET | Freq: Every day | ORAL | Status: DC
  Administered 2019-07-05 – 2019-07-08 (×4): 40 mg via ORAL
  Filled 2019-07-05 (×4): qty 1

## 2019-07-05 MED ORDER — FAMOTIDINE 20 MG PO TABS: 10.0000 mg | ORAL_TABLET | Freq: Every day | ORAL | Status: DC | PRN

## 2019-07-05 MED ORDER — LORATADINE 10 MG PO TABS
10.0000 mg | ORAL_TABLET | Freq: Every day | ORAL | Status: DC
  Administered 2019-07-05 – 2019-07-08 (×4): 10 mg via ORAL
  Filled 2019-07-05 (×4): qty 1

## 2019-07-05 MED ORDER — FENTANYL CITRATE (PF) 100 MCG/2ML IJ SOLN
50.0000 ug | Freq: Once | INTRAMUSCULAR | Status: AC
  Administered 2019-07-05: 14:00:00 50 ug via INTRAVENOUS
  Filled 2019-07-05: qty 2

## 2019-07-05 MED ORDER — FUROSEMIDE 10 MG/ML IJ SOLN
20.0000 mg | Freq: Once | INTRAMUSCULAR | Status: AC
  Administered 2019-07-05: 20:00:00 20 mg via INTRAVENOUS
  Filled 2019-07-05: qty 2

## 2019-07-05 MED ORDER — SODIUM CHLORIDE 0.9% FLUSH
10.0000 mL | Freq: Two times a day (BID) | INTRAVENOUS | Status: DC
  Administered 2019-07-05 – 2019-07-07 (×5): 10 mL via INTRAVENOUS

## 2019-07-05 MED ORDER — SODIUM CHLORIDE 0.9 % IV BOLUS
500.0000 mL | Freq: Once | INTRAVENOUS | Status: AC
  Administered 2019-07-05: 14:00:00 500 mL via INTRAVENOUS

## 2019-07-05 MED ORDER — AZELASTINE HCL 0.1 % NA SOLN
1.0000 | Freq: Two times a day (BID) | NASAL | Status: DC | PRN
  Filled 2019-07-05: qty 30

## 2019-07-05 MED ORDER — ATORVASTATIN CALCIUM 20 MG PO TABS
40.0000 mg | ORAL_TABLET | Freq: Every day | ORAL | Status: DC
  Administered 2019-07-05 – 2019-07-08 (×4): 40 mg via ORAL
  Filled 2019-07-05 (×4): qty 2

## 2019-07-05 MED ORDER — ACETAMINOPHEN 325 MG PO TABS: 650.0000 mg | ORAL_TABLET | Freq: Four times a day (QID) | ORAL | Status: DC | PRN

## 2019-07-05 MED ORDER — POLYETHYLENE GLYCOL 3350 17 G PO PACK
17.0000 g | PACK | Freq: Every day | ORAL | Status: DC | PRN
  Administered 2019-07-07: 17 g via ORAL
  Filled 2019-07-05: qty 1

## 2019-07-05 MED ORDER — MIRTAZAPINE 15 MG PO TABS
7.5000 mg | ORAL_TABLET | Freq: Every day | ORAL | Status: DC
  Administered 2019-07-05 – 2019-07-07 (×3): 7.5 mg via ORAL
  Filled 2019-07-05 (×3): qty 1

## 2019-07-05 MED ORDER — OXYCODONE HCL 5 MG PO TABS
5.0000 mg | ORAL_TABLET | ORAL | Status: DC | PRN
  Administered 2019-07-05 – 2019-07-08 (×12): 5 mg via ORAL
  Filled 2019-07-05 (×13): qty 1

## 2019-07-05 MED ORDER — ACETAMINOPHEN 650 MG RE SUPP: 650.0000 mg | Freq: Four times a day (QID) | RECTAL | Status: DC | PRN

## 2019-07-05 NOTE — ED Provider Notes (Signed)
[facility] Emergency Department Provider Note  ___________________________________________   None    (approximate)  I have reviewed the triage vital signs and the nursing notes.   HISTORY  Chief Complaint Weakness   HPI Jacob Simpson is a 85 y.o. male who presents to the emergency department from home for treatment and evaluation of weakness. He had an MRI on Tuesday and has been "going downhill" since. Patient is complaining of severe pain in his back around his "beltline." He denies chest pain or shortness of breath.     Past Medical History:  Diagnosis Date  . A-fib ([place])   . Aortic aneurysm ([place])   . Aortic stenosis   . Asthma   . CHF (congestive heart failure) ([place])   . Hammertoe   . Heart murmur   . HTN (hypertension)   . TIA (transient ischemic attack)     Patient Active Problem List   Diagnosis Date Noted  . Hypotension 07/05/2019  . HTN (hypertension) 03/10/2019  . Aortic stenosis, severe 03/10/2019  . CHF (congestive heart failure) ([place]) 02/24/2019  . Pain in limb 09/28/2013  . Arthritis of foot, degenerative 09/28/2013    Past Surgical History:  Procedure Laterality Date  . BACK SURGERY  12/89  . CATARACT EXTRACTION W/ INTRAOCULAR LENS IMPLANT Right 12/00  . CATARACT EXTRACTION W/ INTRAOCULAR LENS IMPLANT Left 8/01  . foot sugery Right 6/03  . FOOT SURGERY Left 1/96   hammertoe  . KIDNEY STONE SURGERY  7/90  . ROTATOR CUFF REPAIR Right 1/98  . TOTAL KNEE ARTHROPLASTY Bilateral 1987    Prior to Admission medications   Medication Sig Start Date End Date Taking? Authorizing Provider  acetaminophen (TYLENOL) 500 MG tablet Take 1,000 mg by mouth every 6 (six) hours as needed for mild pain, moderate pain or fever.   Yes [provider]  apixaban (ELIQUIS) 2.5 MG TABS tablet Take 1 tablet (2.5 mg total) by mouth 2 (two) times daily. 06/25/19  Yes Wellington Hampshire, MD  atorvastatin (LIPITOR) 40 MG tablet Take 40 mg by  mouth daily.   Yes [provider]  azelastine (ASTELIN) 0.1 % nasal spray Place 1 spray into both nostrils 2 (two) times daily as needed for rhinitis or allergies. Use in each nostril as directed   Yes [provider]  cetirizine (ZYRTEC) 10 MG tablet Take 10 mg by mouth daily as needed for allergies.   Yes [provider]  famotidine (PEPCID) 10 MG tablet Take 10 mg by mouth daily as needed for heartburn or indigestion.   Yes [provider]  fluticasone (FLOVENT HFA) 110 MCG/ACT inhaler Inhale 1 puff into the lungs 2 (two) times daily as needed (respiratory symptoms).    Yes [provider]  furosemide (LASIX) 40 MG tablet Take 1 tablet (40 mg total) by mouth daily. 03/11/19  Yes Hackney, Otila Kluver A, FNP  guaiFENesin (MUCINEX) 600 MG 12 hr tablet Take 600 mg by mouth 2 (two) times daily as needed for cough or to loosen phlegm.   Yes [provider]  meclizine (ANTIVERT) 12.5 MG tablet Take 12.5 mg by mouth 3 (three) times daily as needed for dizziness.   Yes [provider]  metoprolol succinate (TOPROL-XL) 50 MG 24 hr tablet Take 100 mg by mouth daily. Take with or immediately following a meal.    Yes [provider]  mirtazapine (REMERON) 7.5 MG tablet Take 7.5 mg by mouth at bedtime.   Yes [provider]  omeprazole (PRILOSEC) 20 MG capsule Take 20 mg by mouth daily as needed (stomach acid).   Yes [provider]  ondansetron (ZOFRAN-ODT) 8 MG disintegrating tablet Take 8 mg by mouth every 8 (eight) hours as needed for nausea or vomiting.   Yes [provider]    Allergies Penicillins  Family History  Problem Relation Age of Onset  . Heart disease Father     Social History Social History   Tobacco Use  . Smoking status: Former Research scientist (life sciences)  . Smokeless tobacco: Never Used  . Tobacco comment: quit 1989  Substance Use Topics  . Alcohol use: No  . Drug use: No    Review of Systems   Constitutional: No fever/chills Eyes: No visual changes. ENT: No sore throat. Cardiovascular: Denies chest pain. Respiratory: Denies shortness of breath. Gastrointestinal: No abdominal pain.  No nausea, no vomiting.  No diarrhea.  No constipation. Genitourinary: Negative for dysuria. Musculoskeletal: positive for low back pain. Skin: Negative for rash. Neurological: Negative for headaches, focal weakness or numbness. ____________________________________________   PHYSICAL EXAM:  VITAL SIGNS:77/56, 66, 18, 94% ED Triage Vitals  Enc Vitals Group     BP      Pulse      Resp      Temp      Temp src      SpO2      Weight      Height      Head Circumference      Peak Flow      Pain Score      Pain Loc      Pain Edu?      Excl. in [place]?     Constitutional: Alert and oriented to person and place. Well appearing and in no acute distress. Eyes: Conjunctivae are normal. PERRL. Head: Atraumatic. Nose: No congestion/rhinnorhea. Mouth/Throat: Mucous membranes are moist.  Oropharynx non-erythematous. Neck: No stridor.   Cardiovascular: Normal rate, regular rhythm. Grossly normal heart sounds.  Good peripheral circulation. Respiratory: Normal respiratory effort.  No retractions. Lungs CTAB. Gastrointestinal: Soft and nontender. No distention. No abdominal bruits.  Musculoskeletal: No lower extremity tenderness. Compression stockings on both lower extremities.  No joint effusions. Neurologic:  Normal speech and language. No gross focal neurologic deficits are appreciated. No gait instability. Skin:  Skin is warm, dry and intact. No rash noted. Psychiatric: Mood and affect are normal. Speech and behavior are normal.  ____________________________________________   LABS (all labs ordered are listed, but only abnormal results are displayed)  Labs Reviewed  BASIC METABOLIC PANEL - Abnormal; Notable for the following components:      Result Value   CO2 20 (*)    Glucose, Bld 188 (*)     BUN 36 (*)    Creatinine, Ser 2.21 (*)    Calcium 7.8 (*)    GFR calc non Af Amer 26 (*)    GFR calc Af Amer 30 (*)    All other components within normal limits  CBC WITH DIFFERENTIAL/PLATELET - Abnormal; Notable for the following components:   RBC 3.69 (*)    Hemoglobin 11.6 (*)    HCT 35.0 (*)    Platelets 95 (*)    All other components within normal limits  URINALYSIS, COMPLETE (UACMP) WITH MICROSCOPIC - Abnormal; Notable for the following components:   Color, Urine AMBER (*)    APPearance HAZY (*)    Bilirubin Urine SMALL (*)    Ketones, ur 5 (*)    Protein, ur 100 (*)  All other components within normal limits  TROPONIN I (HIGH SENSITIVITY) - Abnormal; Notable for the following components:   Troponin I (High Sensitivity) 51 (*)    All other components within normal limits  TROPONIN I (HIGH SENSITIVITY) - Abnormal; Notable for the following components:   Troponin I (High Sensitivity) 53 (*)    All other components within normal limits  NOVEL CORONAVIRUS, NAA (HOSPITAL ORDER, SEND-OUT TO REF LAB)  LACTIC ACID, PLASMA  GLUCOSE, CAPILLARY  BASIC METABOLIC PANEL  CBC  FERRITIN  IRON AND TIBC  VITAMIN B12  FOLATE   ____________________________________________  EKG  ED ECG REPORT I, Makalynn Berwanger, FNP-BC personally viewed and interpreted this ECG.   Date: 07/05/2019  EKG Time: 1326  Rate: 87  Rhythm: normal sinus rhythm  Axis: normal  Intervals:none  ST&T Change: no ST elevation  ____________________________________________  RADIOLOGY  ED MD interpretation:    Chest x-ray negative for acute findings.  Lumbar spine image shows no abnormalities. Stable postsurgical degenerative changes.  Official radiology report(s): Dg Chest 2 View  Result Date: 07/05/2019 CLINICAL DATA:  Weakness. EXAM: CHEST - 2 VIEW COMPARISON:  02/28/2019. FINDINGS: Stable mildly enlarged cardiac silhouette and prominent pulmonary vasculature. Mild increase in prominence of the  interstitial markings with Kerley lines. No definite pleural fluid. Mild left basilar and minimal right basilar atelectasis. Diffuse osteopenia. Left shoulder degenerative changes. IMPRESSION: 1. Probable mild interstitial pulmonary edema with stable pulmonary vascular congestion and mild cardiomegaly. 2. Mild left basilar atelectasis and minimal right basilar atelectasis. Electronically Signed   By: Claudie Revering M.D.   On: 07/05/2019 14:02   Dg Lumbar Spine 2-3 Views  Result Date: 07/05/2019 CLINICAL DATA:  Low back pain and weakness. EXAM: LUMBAR SPINE - 2-3 VIEW COMPARISON:  Abdomen series dated 02/03/2015. FINDINGS: Five non-rib-bearing lumbar vertebrae. Stable screw and plate fusion at the L4 through S1 levels with bone fusion on the right from the S1 level to the mid L2 level and on the left from the S1 level to the L3-4 level. Extensive lumbar and lower thoracic spine degenerative changes are again demonstrated. Approximately 20% T12 compression deformity with no acute fracture lines or bony retropulsion without gross change in the frontal projection. No acute fractures are visualized. Diffuse osteopenia. Atheromatous arterial calcifications. IMPRESSION: 1. No acute abnormality. 2. Stable postsurgical and degenerative changes. Electronically Signed   By: Claudie Revering M.D.   On: 07/05/2019 14:05    ____________________________________________   PROCEDURES  Procedure(s) performed (including Critical Care):  Procedures   ____________________________________________   INITIAL IMPRESSION / ASSESSMENT AND PLAN / ED COURSE  As part of my medical decision making, I reviewed the following data within the electronic MEDICAL RECORD NUMBER History obtained from family and Notes from prior ED visits  85 year old who presents to the emergency department for treatment and evaluation of weakness and hypotension. His is complaining of low back pain. Family report that the back pain is chronic with  intermittent exacerbations.    ----------------------------------------- 2:16 PM on 07/05/2019 -----------------------------------------  Hypotension noted. Fluid bolus of 518ml ordered. Will monitor.    ----------------------------------------- 3:30 PM on 07/05/2019 -----------------------------------------  Blood pressure is 90s over 60s.  The patient is sitting up, talking, and appears to be more comfortable than upon arrival.  He is drinking ice water without any swallowing difficulty.  ----------------------------------------- 4:26 PM on 07/05/2019 -----------------------------------------  Patient will be admitted for further evaluation of hypotension, acute kidney injury, and back pain.  He is resting more comfortably  at the moment.  His daughter is bedside who is his medical/healthcare power of attorney.  MILAD BUBLITZ was evaluated in Emergency Department on 07/05/2019 for the symptoms described in the history of present illness. He was evaluated in the context of the global COVID-19 pandemic, which necessitated consideration that the patient might be at risk for infection with the SARS-CoV-2 virus that causes COVID-19. Institutional protocols and algorithms that pertain to the evaluation of patients at risk for COVID-19 are in a state of rapid change based on information released by regulatory bodies including the CDC and federal and state organizations. These policies and algorithms were followed during the patient's care in the ED.  CRITICAL CARE Performed by: Sherrie George   Total critical care time: 30 minutes  Critical care time was exclusive of separately billable procedures and treating other patients.  Critical care was necessary to treat or prevent imminent or life-threatening deterioration.  Critical care was time spent personally by me on the following activities: development of treatment plan with patient and/or surrogate as well as nursing, discussions with  consultants, evaluation of patient's response to treatment, examination of patient, obtaining history from patient or surrogate, ordering and performing treatments and interventions, ordering and review of laboratory studies, ordering and review of radiographic studies, pulse oximetry and re-evaluation of patient's condition.    ____________________________________________   FINAL CLINICAL IMPRESSION(S) / ED DIAGNOSES  Final diagnoses:  Acute lumbar back pain  Weakness  Hypotension, unspecified hypotension type  Acute kidney injury (nontraumatic) [facility])     ED Discharge Orders    None       Note:  This document was prepared using Dragon voice recognition software and may include unintentional dictation errors.    Victorino Dike, FNP 07/05/19 2046    Arta Silence, MD 07/06/19 [phone]

## 2019-07-05 NOTE — ED Notes (Signed)
Patient from home as per ems patient with history of dementia. Patient aox4 upon ed arrival, [facility] but answers questions appropriately. Daughter on her way.

## 2019-07-05 NOTE — ED Triage Notes (Signed)
Patient from home hx of dementia, c/o of deeling weak since Tuesday after MRI appointment. Patient able to answer yes no questions. Poor historian. As per ems patient hypotensive upon their arrival. Palpable b/p in the 70's. Bolus 250 ns given in route.

## 2019-07-05 NOTE — ED Notes (Signed)
Daughter at bedside.

## 2019-07-05 NOTE — H&P (Addendum)
[facility] NAME: Jacob Simpson    MR#:  [id number]  DATE OF BIRTH:  1930/08/31  DATE OF ADMISSION:  07/05/2019  PRIMARY CARE PHYSICIAN: Valera Castle, MD   REQUESTING/REFERRING PHYSICIAN: Sherrie George, FNP  CHIEF COMPLAINT:   Chief Complaint  Patient presents with  . Weakness    HISTORY OF PRESENT ILLNESS:  Jacob Simpson  is a 85 y.o. male with a known history of atrial fibrillation, hx AAA, CHF, hypertension, hx TIA, aortic stenosis, and asthma who presented to the ED with weakness and hypotension. On EMS arrival to the home, BP was in the 78M systolics. He was given a 250cc bolus in route. He states he is feeling better. He has felt very weak over the last couple of days.  He denies any chest pain or shortness of breath.  Per daughter in the room, she found in "slumped over" in the house this morning.  He was alert but "stated that he could not really see her".  He is also been endorsing some generalized back pain over the last couple of days.  On arrival to the ED, BPs were as low as 77/56, but improved to 84/56 with a bolus. Labs were significant for creatinine 2.21, hemoglobin 11.6. UA was unremarkable. CXR showed mild interstitial pulmonary edema and pulmonary vascular congestion. Lumbar x-ray was unremarkable. Hospitalists were called for admission.  PAST MEDICAL HISTORY:   Past Medical History:  Diagnosis Date  . A-fib ([place])   . Aortic aneurysm ([place])   . Aortic stenosis   . Asthma   . CHF (congestive heart failure) ([place])   . Hammertoe   . Heart murmur   . HTN (hypertension)   . TIA (transient ischemic attack)     PAST SURGICAL HISTORY:   Past Surgical History:  Procedure Laterality Date  . BACK SURGERY  12/89  . CATARACT EXTRACTION W/ INTRAOCULAR LENS IMPLANT Right 12/00  . CATARACT EXTRACTION W/ INTRAOCULAR LENS IMPLANT Left 8/01  . foot sugery Right 6/03  . FOOT SURGERY Left 1/96   hammertoe  .  KIDNEY STONE SURGERY  7/90  . ROTATOR CUFF REPAIR Right 1/98  . TOTAL KNEE ARTHROPLASTY Bilateral 1987    SOCIAL HISTORY:   Social History   Tobacco Use  . Smoking status: Former Research scientist (life sciences)  . Smokeless tobacco: Never Used  . Tobacco comment: quit 1989  Substance Use Topics  . Alcohol use: No    FAMILY HISTORY:   Family History  Problem Relation Age of Onset  . Heart disease Father     DRUG ALLERGIES:   Allergies  Allergen Reactions  . Penicillins Rash    Did it involve swelling of the face/tongue/throat, SOB, or low BP? Unknown Did it involve sudden or severe rash/hives, skin peeling, or any reaction on the inside of your mouth or nose? Unknown Did you need to seek medical attention at a hospital or doctor's office? Unknown When did it last happen?Unknown If all above answers are "NO", may proceed with cephalosporin use.     REVIEW OF SYSTEMS:   Review of Systems  Constitutional: Negative for chills and fever.  HENT: Negative for congestion and sore throat.   Eyes: Negative for blurred vision and double vision.  Respiratory: Negative for cough and shortness of breath.   Cardiovascular: Positive for leg swelling. Negative for chest pain.  Gastrointestinal: Negative for nausea and vomiting.  Genitourinary: Negative for dysuria and urgency.  Musculoskeletal: Positive  for back pain and joint pain.  Neurological: Positive for weakness. Negative for dizziness, focal weakness and headaches.  Psychiatric/Behavioral: Negative for depression. The patient is not nervous/anxious.     MEDICATIONS AT HOME:   Prior to Admission medications   Medication Sig Start Date End Date Taking? Authorizing Provider  acetaminophen (TYLENOL) 500 MG tablet Take 1,000 mg by mouth every 6 (six) hours as needed for mild pain, moderate pain or fever.   Yes [provider]  apixaban (ELIQUIS) 2.5 MG TABS tablet Take 1 tablet (2.5 mg total) by mouth 2 (two) times daily. 06/25/19  Yes  Wellington Hampshire, MD  atorvastatin (LIPITOR) 40 MG tablet Take 40 mg by mouth daily.   Yes [provider]  azelastine (ASTELIN) 0.1 % nasal spray Place 1 spray into both nostrils 2 (two) times daily as needed for rhinitis or allergies. Use in each nostril as directed   Yes [provider]  cetirizine (ZYRTEC) 10 MG tablet Take 10 mg by mouth daily as needed for allergies.   Yes [provider]  famotidine (PEPCID) 10 MG tablet Take 10 mg by mouth daily as needed for heartburn or indigestion.   Yes [provider]  fluticasone (FLOVENT HFA) 110 MCG/ACT inhaler Inhale 1 puff into the lungs 2 (two) times daily as needed (respiratory symptoms).    Yes [provider]  furosemide (LASIX) 40 MG tablet Take 1 tablet (40 mg total) by mouth daily. 03/11/19  Yes Hackney, Otila Kluver A, FNP  guaiFENesin (MUCINEX) 600 MG 12 hr tablet Take 600 mg by mouth 2 (two) times daily as needed for cough or to loosen phlegm.   Yes [provider]  meclizine (ANTIVERT) 12.5 MG tablet Take 12.5 mg by mouth 3 (three) times daily as needed for dizziness.   Yes [provider]  metoprolol succinate (TOPROL-XL) 50 MG 24 hr tablet Take 100 mg by mouth daily. Take with or immediately following a meal.    Yes [provider]  mirtazapine (REMERON) 7.5 MG tablet Take 7.5 mg by mouth at bedtime.   Yes [provider]  omeprazole (PRILOSEC) 20 MG capsule Take 20 mg by mouth daily as needed (stomach acid).   Yes [provider]  ondansetron (ZOFRAN-ODT) 8 MG disintegrating tablet Take 8 mg by mouth every 8 (eight) hours as needed for nausea or vomiting.   Yes [provider]      VITAL SIGNS:  Blood pressure (!) 84/56, pulse 66, temperature 98.6 F (37 C), temperature source Oral, resp. rate 17, height 5\' 9"  (1.753 m), weight 81.6 kg, SpO2 94 %.  PHYSICAL EXAMINATION:  Physical Exam  GENERAL:  85 y.o.-year-old patient lying in the bed  with no acute distress.  EYES: Pupils equal, round, reactive to light and accommodation. No scleral icterus. Extraocular muscles intact.  HEENT: Head atraumatic, normocephalic. Oropharynx and nasopharynx clear.  NECK:  Supple, no jugular venous distention. No thyroid enlargement, no tenderness.  LUNGS: +diminished breath sounds in the lung bases bilaterally. No use of accessory muscles of respiration.  CARDIOVASCULAR: RRR, S1, S2 normal. No rubs or gallops. +III/VI systolic murmur loudest in the RUSB. ABDOMEN: Soft, nontender, nondistended. Bowel sounds present. No organomegaly or mass.  EXTREMITIES: No cyanosis, or clubbing. 1-2+ pitting edema in the lower extremities bilaterally. NEUROLOGIC: Cranial nerves II through XII are intact. +global weakness. Sensation intact. Gait not checked.  PSYCHIATRIC: The patient is alert and oriented x 3.  SKIN: No obvious rash, lesion, or ulcer.  LABORATORY PANEL:   CBC Recent Labs  Lab 07/05/19 1330  WBC 7.5  HGB 11.6*  HCT 35.0*  PLT 95*   ------------------------------------------------------------------------------------------------------------------  Chemistries  Recent Labs  Lab 07/05/19 1330  NA 139  K 3.9  CL 110  CO2 20*  GLUCOSE 188*  BUN 36*  CREATININE 2.21*  CALCIUM 7.8*   ------------------------------------------------------------------------------------------------------------------  Cardiac Enzymes No results for input(s): TROPONINI in the last 168 hours. ------------------------------------------------------------------------------------------------------------------  RADIOLOGY:  Dg Chest 2 View  Result Date: 07/05/2019 CLINICAL DATA:  Weakness. EXAM: CHEST - 2 VIEW COMPARISON:  02/28/2019. FINDINGS: Stable mildly enlarged cardiac silhouette and prominent pulmonary vasculature. Mild increase in prominence of the interstitial markings with Kerley lines. No definite pleural fluid. Mild left basilar and minimal right  basilar atelectasis. Diffuse osteopenia. Left shoulder degenerative changes. IMPRESSION: 1. Probable mild interstitial pulmonary edema with stable pulmonary vascular congestion and mild cardiomegaly. 2. Mild left basilar atelectasis and minimal right basilar atelectasis. Electronically Signed   By: Claudie Revering M.D.   On: 07/05/2019 14:02   Dg Lumbar Spine 2-3 Views  Result Date: 07/05/2019 CLINICAL DATA:  Low back pain and weakness. EXAM: LUMBAR SPINE - 2-3 VIEW COMPARISON:  Abdomen series dated 02/03/2015. FINDINGS: Five non-rib-bearing lumbar vertebrae. Stable screw and plate fusion at the L4 through S1 levels with bone fusion on the right from the S1 level to the mid L2 level and on the left from the S1 level to the L3-4 level. Extensive lumbar and lower thoracic spine degenerative changes are again demonstrated. Approximately 20% T12 compression deformity with no acute fracture lines or bony retropulsion without gross change in the frontal projection. No acute fractures are visualized. Diffuse osteopenia. Atheromatous arterial calcifications. IMPRESSION: 1. No acute abnormality. 2. Stable postsurgical and degenerative changes. Electronically Signed   By: Claudie Revering M.D.   On: 07/05/2019 14:05      IMPRESSION AND PLAN:   Hypotension- likely related to patient's severe aortic stenosis.  Patient is supposed to see the T[facility]R team in [facility] tomorrow. Follows with Dr. Fletcher Anon as an outpatient. -Cardiology consult -Hold home BP meds and Lasix -Repeat ECHO -May need to consider aortic valve replacement at this time -Cardiac monitoring  Weakness-likely due to above -Obtain PT consult when appropriate  AKI in CKD III- likely due to poor cardiac output. -Avoid nephrotoxic agents -Renal US -Recheck in the morning  Paroxysmal atrial fibrillation- in NSR here -Holding home metoprolol due to hypotension -Continue home Eliquis  Low back pain- likely due to arthritis. -X-ray of the lumbar spine  showed degenerative changes -Start low-dose gabapentin  Hyperlipidemia-stable -Continue home Lipitor  Normocytic anemia- hemogloblin lower than baseline. No active bleeding. -Check anemia panel  All the records are reviewed and case discussed with ED provider. Management plans discussed with the patient, family and they are in agreement.  CODE STATUS: Full  TOTAL TIME TAKING CARE OF THIS PATIENT: 45 minutes.    Berna Spare Mayo M.D on 07/05/2019 at 3:08 PM  Between 7am to 6pm - Pager - [phone]  After 6pm go to [email or web address] - Proofreader  [employer] [facility]  Office  [phone]  CC: Primary care physician; Valera Castle, MD   Note: This dictation was prepared with Dragon dictation along with smaller phrase technology. Any transcriptional errors that result from this process are unintentional.

## 2019-07-05 NOTE — Progress Notes (Addendum)
Family Meeting Note  Advance Directive:yes  Today a meeting took place with the Patient and daughter Chauncey Reading).  Patient is able to participate.  The following clinical team members were present during this meeting:MD  The following were discussed:Patient's diagnosis: hypotension, Patient's progosis: Unable to determine and Goals for treatment: DNI  Per discussion with patient and daughter, patient would not want to be intubated under any circumstances. He would be okay with cardiac resuscitation.  Additional follow-up to be provided: prn  Time spent during discussion:20 minutes  Evette Doffing, MD

## 2019-07-05 NOTE — ED Notes (Signed)
patoient hypottensive, PA aware. Fluid bolus ordered and administered. Will monitor.

## 2019-07-05 NOTE — ED Notes (Signed)
ED TO INPATIENT HANDOFF REPORT  ED Nurse Name and Phone #:    S Name/Age/Gender Vicki Mallet 85 y.o. male Room/Bed: ED15A/ED15A  Code Status   Code Status: Prior  Home/SNF/Other Home Patient oriented to: self, place, time and situation Is this baseline? Yes   Triage Complete: Triage complete  Chief Complaint Weakness   Triage Note Patient from home hx of dementia, c/o of deeling weak since Tuesday after MRI appointment. Patient able to answer yes no questions. Poor historian. As per ems patient hypotensive upon their arrival. Palpable b/p in the 70's. Bolus 250 ns given in route.    Allergies Allergies  Allergen Reactions  . Penicillins Rash    Did it involve swelling of the face/tongue/throat, SOB, or low BP? Unknown Did it involve sudden or severe rash/hives, skin peeling, or any reaction on the inside of your mouth or nose? Unknown Did you need to seek medical attention at a hospital or doctor's office? Unknown When did it last happen?Unknown If all above answers are "NO", may proceed with cephalosporin use.     Level of Care/Admitting Diagnosis ED Disposition    ED Disposition Condition [facility] Area: [place] [[id number]]  Level of Care: Telemetry [5]  Covid Evaluation: Asymptomatic Screening Protocol (No Symptoms)  Diagnosis: Hypotension [010932]  Admitting Physician: Hyman Bible DODD [[id number]]  Attending Physician: Hyman Bible DODD [[id number]]  Estimated length of stay: past midnight tomorrow  Certification:: I certify this patient will need inpatient services for at least 2 midnights  PT Class (Do Not Modify): Inpatient [101]  PT Acc Code (Do Not Modify): Private [1]       B Medical/Surgery History Past Medical History:  Diagnosis Date  . A-fib ([place])   . Aortic aneurysm ([place])   . Aortic stenosis   . Asthma   . CHF (congestive heart failure) ([place])   . Hammertoe   . Heart murmur   . HTN (hypertension)    . TIA (transient ischemic attack)    Past Surgical History:  Procedure Laterality Date  . BACK SURGERY  12/89  . CATARACT EXTRACTION W/ INTRAOCULAR LENS IMPLANT Right 12/00  . CATARACT EXTRACTION W/ INTRAOCULAR LENS IMPLANT Left 8/01  . foot sugery Right 6/03  . FOOT SURGERY Left 1/96   hammertoe  . KIDNEY STONE SURGERY  7/90  . ROTATOR CUFF REPAIR Right 1/98  . TOTAL KNEE ARTHROPLASTY Bilateral 1987     A IV Location/Drains/Wounds Patient Lines/Drains/Airways Status   Active Line/Drains/Airways    Name:   Placement date:   Placement time:   Site:   Days:   Peripheral IV 07/05/19 Anterior;Left Forearm   07/05/19    1320    Forearm   less than 1          Intake/Output Last 24 hours  Intake/Output Summary (Last 24 hours) at 07/05/2019 1657 Last data filed at 07/05/2019 1549 Gross per 24 hour  Intake 500 ml  Output -  Net 500 ml    Labs/Imaging Results for orders placed or performed during the hospital encounter of 07/05/19 (from the past 48 hour(s))  Basic metabolic panel     Status: Abnormal   Collection Time: 07/05/19  1:30 PM  Result Value Ref Range   Sodium 139 135 - 145 mmol/L   Potassium 3.9 3.5 - 5.1 mmol/L   Chloride 110 98 - 111 mmol/L   CO2 20 (L) 22 - 32 mmol/L   Glucose, Bld 188 (H)  70 - 99 mg/dL   BUN 36 (H) 8 - 23 mg/dL   Creatinine, Ser 2.21 (H) 0.61 - 1.24 mg/dL   Calcium 7.8 (L) 8.9 - 10.3 mg/dL   GFR calc non Af Amer 26 (L) >60 mL/min   GFR calc Af Amer 30 (L) >60 mL/min   Anion gap 9 5 - 15    Comment: Performed at [facility], [place], [place] [address]  Troponin I (High Sensitivity)     Status: Abnormal   Collection Time: 07/05/19  1:30 PM  Result Value Ref Range   Troponin I (High Sensitivity) 51 (H) <18 ng/L    Comment: (NOTE) Elevated high sensitivity troponin I (hsTnI) values and significant  changes across serial measurements may suggest ACS but many other  chronic and acute conditions are known to elevate  hsTnI results.  Refer to the "Links" section for chest pain algorithms and additional  guidance. Performed at [facility], [address] [place], [place] [address]   CBC with Differential     Status: Abnormal   Collection Time: 07/05/19  1:30 PM  Result Value Ref Range   WBC 7.5 4.0 - 10.5 K/uL   RBC 3.69 (L) 4.22 - 5.81 MIL/uL   Hemoglobin 11.6 (L) 13.0 - 17.0 g/dL   HCT 35.0 (L) 39.0 - 52.0 %   MCV 94.9 80.0 - 100.0 fL   MCH 31.4 26.0 - 34.0 pg   MCHC 33.1 30.0 - 36.0 g/dL   RDW 12.4 11.5 - 15.5 %   Platelets 95 (L) 150 - 400 K/uL    Comment: Immature Platelet Fraction may be clinically indicated, consider ordering this additional test [id number]    nRBC 0.0 0.0 - 0.2 %   Neutrophils Relative % 61 %   Neutro Abs 4.6 1.7 - 7.7 K/uL   Lymphocytes Relative 22 %   Lymphs Abs 1.7 0.7 - 4.0 K/uL   Monocytes Relative 9 %   Monocytes Absolute 0.7 0.1 - 1.0 K/uL   Eosinophils Relative 7 %   Eosinophils Absolute 0.5 0.0 - 0.5 K/uL   Basophils Relative 1 %   Basophils Absolute 0.1 0.0 - 0.1 K/uL   Immature Granulocytes 0 %   Abs Immature Granulocytes 0.02 0.00 - 0.07 K/uL    Comment: Performed at [facility], [address] [place], [place] [address]  Urinalysis, Complete w Microscopic     Status: Abnormal   Collection Time: 07/05/19  1:31 PM  Result Value Ref Range   Color, Urine AMBER (A) YELLOW    Comment: BIOCHEMICALS MAY BE AFFECTED BY COLOR   APPearance HAZY (A) CLEAR   Specific Gravity, Urine 1.025 1.005 - 1.030   pH 5.0 5.0 - 8.0   Glucose, UA NEGATIVE NEGATIVE mg/dL   Hgb urine dipstick NEGATIVE NEGATIVE   Bilirubin Urine SMALL (A) NEGATIVE   Ketones, ur 5 (A) NEGATIVE mg/dL   Protein, ur 100 (A) NEGATIVE mg/dL   Nitrite NEGATIVE NEGATIVE   Leukocytes,Ua NEGATIVE NEGATIVE   RBC / HPF 0-5 0 - 5 RBC/hpf   WBC, UA 0-5 0 - 5 WBC/hpf   Bacteria, UA NONE SEEN NONE SEEN   Squamous Epithelial / LPF 0-5 0 - 5   Mucus PRESENT    Hyaline Casts, UA  PRESENT     Comment: Performed at [facility], [address] [place], [place] [address]  Lactic acid, plasma     Status: None   Collection Time: 07/05/19  2:20 PM  Result Value Ref Range   Lactic Acid, Venous 1.4 0.5 - 1.9 mmol/L    Comment: Performed at [facility], [place], [place] [address]  Troponin I (High Sensitivity)     Status: Abnormal   Collection Time: 07/05/19  2:21 PM  Result Value Ref Range   Troponin I (High Sensitivity) 53 (H) <18 ng/L    Comment: (NOTE) Elevated high sensitivity troponin I (hsTnI) values and significant  changes across serial measurements may suggest ACS but many other  chronic and acute conditions are known to elevate hsTnI results.  Refer to the "Links" section for chest pain algorithms and additional  guidance. Performed at [facility], [address] [place], [place] [address]    Dg Chest 2 View  Result Date: 07/05/2019 CLINICAL DATA:  Weakness. EXAM: CHEST - 2 VIEW COMPARISON:  02/28/2019. FINDINGS: Stable mildly enlarged cardiac silhouette and prominent pulmonary vasculature. Mild increase in prominence of the interstitial markings with Kerley lines. No definite pleural fluid. Mild left basilar and minimal right basilar atelectasis. Diffuse osteopenia. Left shoulder degenerative changes. IMPRESSION: 1. Probable mild interstitial pulmonary edema with stable pulmonary vascular congestion and mild cardiomegaly. 2. Mild left basilar atelectasis and minimal right basilar atelectasis. Electronically Signed   By: Claudie Revering M.D.   On: 07/05/2019 14:02   Dg Lumbar Spine 2-3 Views  Result Date: 07/05/2019 CLINICAL DATA:  Low back pain and weakness. EXAM: LUMBAR SPINE - 2-3 VIEW COMPARISON:  Abdomen series dated 02/03/2015. FINDINGS: Five non-rib-bearing lumbar vertebrae. Stable screw and plate fusion at the L4 through S1 levels with bone fusion on the right from the S1 level to the mid L2 level and on the  left from the S1 level to the L3-4 level. Extensive lumbar and lower thoracic spine degenerative changes are again demonstrated. Approximately 20% T12 compression deformity with no acute fracture lines or bony retropulsion without gross change in the frontal projection. No acute fractures are visualized. Diffuse osteopenia. Atheromatous arterial calcifications. IMPRESSION: 1. No acute abnormality. 2. Stable postsurgical and degenerative changes. Electronically Signed   By: Claudie Revering M.D.   On: 07/05/2019 14:05    Pending Labs Unresulted Labs (From admission, onward)    Start     Ordered   07/05/19 1420  Novel Coronavirus,NAA,(SEND-OUT TO REF LAB - TAT 24-48 hrs); Hosp Order  (Asymptomatic Patients Labs)  Once,   STAT    Question:  Rule Out  Answer:  Yes   07/05/19 1419   07/05/19 1418  Lactic acid, plasma  Now then every 2 hours,   STAT     07/05/19 1417   Signed and Held  Basic metabolic panel  Tomorrow morning,   R     Signed and Held   Signed and Held  CBC  Tomorrow morning,   R     Signed and Held   Signed and Held  Ferritin  Once,   R     Signed and Held   Signed and Held  Iron and TIBC  Once,   R     Signed and Held   Signed and Held  Vitamin B12  Once,   R     Signed and Held   Signed and Held  Folate  Once,   R     Signed and Held          Vitals/Pain Today's Vitals   07/05/19 1548 07/05/19 1600 07/05/19 1615 07/05/19 1656  BP: 92/67 95/69  95/69  Pulse: 67 63 66 66  Resp: 18 14 15 15   Temp:      TempSrc:      SpO2: 96% 95% 99% 99%  Weight:      Height:      PainSc: 6    4     Isolation Precautions No active isolations  Medications Medications  sodium chloride 0.9 % bolus 500 mL (0 mLs Intravenous Stopped 07/05/19 1549)  fentaNYL (SUBLIMAZE) injection 50 mcg (50 mcg Intravenous Given 07/05/19 1418)    Mobility walks High fall risk   Focused Assessments Cardiac Assessment Handoff:  Cardiac Rhythm: Normal sinus rhythm Lab Results  Component Value  Date   TROPONINI 0.21 ([place]) 02/25/2019   No results found for: DDIMER Does the Patient currently have chest pain? No     R Recommendations: See Admitting Provider Note  Report given to:   Additional Notes:

## 2019-07-06 ENCOUNTER — Telehealth: Payer: Self-pay | Admitting: Cardiovascular Disease

## 2019-07-06 ENCOUNTER — Inpatient Hospital Stay: Payer: Medicare Other

## 2019-07-06 ENCOUNTER — Institutional Professional Consult (permissible substitution): Payer: Medicare Other | Admitting: Cardiovascular Disease

## 2019-07-06 ENCOUNTER — Other Ambulatory Visit: Payer: Self-pay

## 2019-07-06 ENCOUNTER — Inpatient Hospital Stay (HOSPITAL_COMMUNITY)
Admit: 2019-07-06 | Discharge: 2019-07-06 | Disposition: A | Discharge status: Home / Self Care | Payer: Medicare Other | Attending: Internal Medicine | Admitting: Internal Medicine

## 2019-07-06 DIAGNOSIS — E861 Hypovolemia: Secondary | ICD-10-CM

## 2019-07-06 DIAGNOSIS — I9589 Other hypotension: Secondary | ICD-10-CM

## 2019-07-06 DIAGNOSIS — I350 Nonrheumatic aortic (valve) stenosis: Secondary | ICD-10-CM

## 2019-07-06 DIAGNOSIS — R300 Dysuria: Secondary | ICD-10-CM

## 2019-07-06 DIAGNOSIS — I361 Nonrheumatic tricuspid (valve) insufficiency: Secondary | ICD-10-CM

## 2019-07-06 DIAGNOSIS — I5032 Chronic diastolic (congestive) heart failure: Secondary | ICD-10-CM

## 2019-07-06 DIAGNOSIS — I351 Nonrheumatic aortic (valve) insufficiency: Secondary | ICD-10-CM

## 2019-07-06 DIAGNOSIS — I340 Nonrheumatic mitral (valve) insufficiency: Secondary | ICD-10-CM

## 2019-07-06 LAB — ECHOCARDIOGRAM COMPLETE
Height: 69 in
Weight: 3059.2 oz

## 2019-07-06 LAB — BASIC METABOLIC PANEL
Anion gap: 8 (ref 5–15)
BUN: 32 mg/dL — ABNORMAL HIGH (ref 8–23)
CO2: 21 mmol/L — ABNORMAL LOW (ref 22–32)
Calcium: 7.9 mg/dL — ABNORMAL LOW (ref 8.9–10.3)
Chloride: 109 mmol/L (ref 98–111)
Creatinine, Ser: 1.8 mg/dL — ABNORMAL HIGH (ref 0.61–1.24)
GFR calc Af Amer: 38 mL/min — ABNORMAL LOW (ref >60)
GFR calc non Af Amer: 33 mL/min — ABNORMAL LOW (ref >60)
Glucose, Bld: 125 mg/dL — ABNORMAL HIGH (ref 70–99)
Potassium: 3.6 mmol/L (ref 3.5–5.1)
Sodium: 138 mmol/L (ref 135–145)

## 2019-07-06 LAB — CBC
HCT: 40.1 % (ref 39.0–52.0)
Hemoglobin: 13.2 g/dL (ref 13.0–17.0)
MCH: 32 pg (ref 26.0–34.0)
MCHC: 32.9 g/dL (ref 30.0–36.0)
MCV: 97.1 fL (ref 80.0–100.0)
Platelets: 104 10*3/uL — ABNORMAL LOW (ref 150–400)
RBC: 4.13 MIL/uL — ABNORMAL LOW (ref 4.22–5.81)
RDW: 12.6 % (ref 11.5–15.5)
WBC: 9.4 10*3/uL (ref 4.0–10.5)
nRBC: 0 % (ref 0.0–0.2)

## 2019-07-06 LAB — VITAMIN B12: Vitamin B-12: 1354 pg/mL — ABNORMAL HIGH (ref 180–914)

## 2019-07-06 IMAGING — MR MRI THORACIC SPINE WITHOUT CONTRAST
6 series · 39 of 48 positions shown · non-contrast
Comparison: Chest radiography [DATE]

CLINICAL DATA: New onset thoracic back pain.

EXAM:
MRI THORACIC SPINE WITHOUT CONTRAST
TECHNIQUE: Multiplanar, multisequence MR imaging of the thoracic spine was
performed. No intravenous contrast was administered.

[Series 11: T1 · sagittal · 6.0mm · 1.88mm/px · 5 of 13 slices shown (1 of 2)]
[im 1/13]
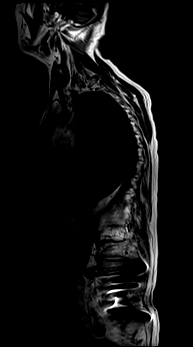
[im 4/13]
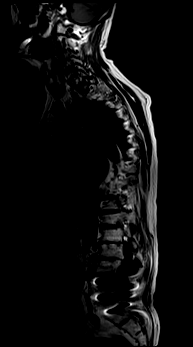
[im 7/13]
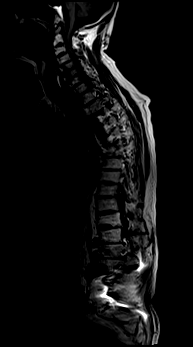
[im 10/13]
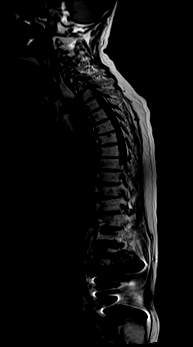
[im 13/13]
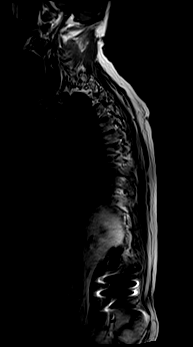

[Series 12: T2 · sagittal · 3.0mm · 1.33mm/px · 7 of 25 slices shown (1 of 2)]
[im 1/25]
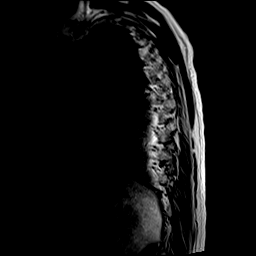
[im 5/25]
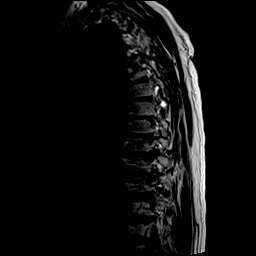
[im 9/25]
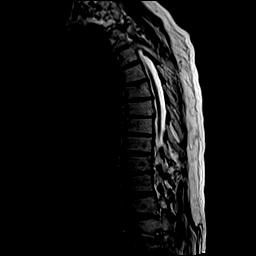
[im 13/25]
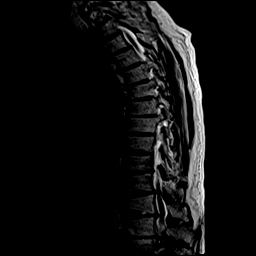
[im 17/25]
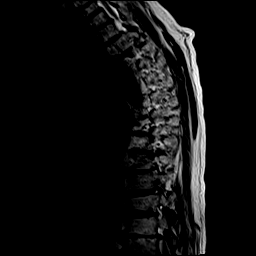
[im 21/25]
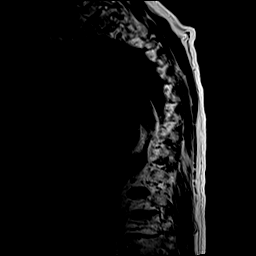
[im 25/25]
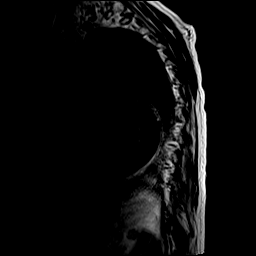

[Series 13: T1 · sagittal · 3.0mm · 1.33mm/px · 7 of 25 slices shown (2 of 2)]
[im 1/25]
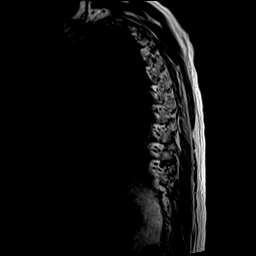
[im 5/25]
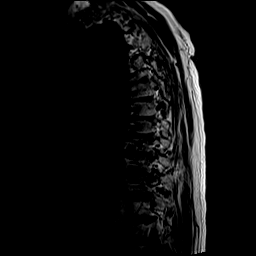
[im 9/25]
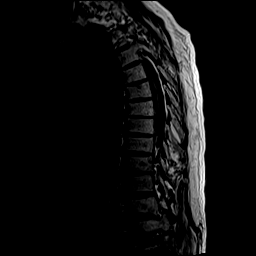
[im 13/25]
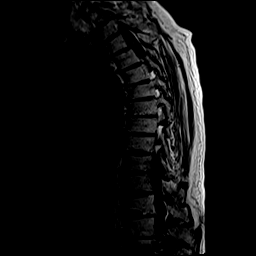
[im 17/25]
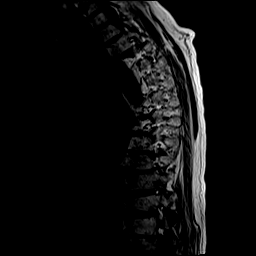
[im 21/25]
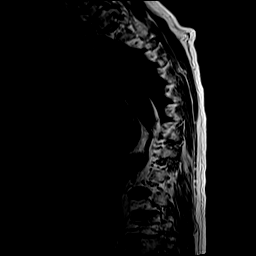
[im 25/25]
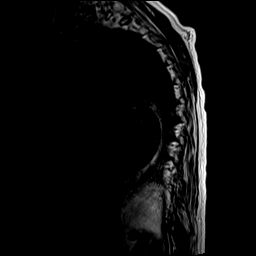

[Series 14: STIR · sagittal · 3.0mm · 0.66mm/px · 7 of 25 slices shown]
[im 1/25]
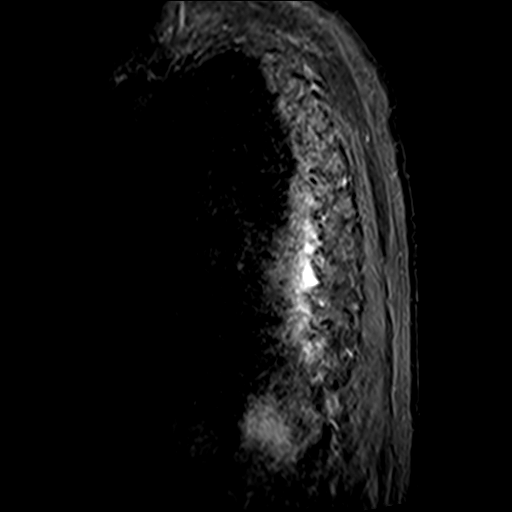
[im 5/25]
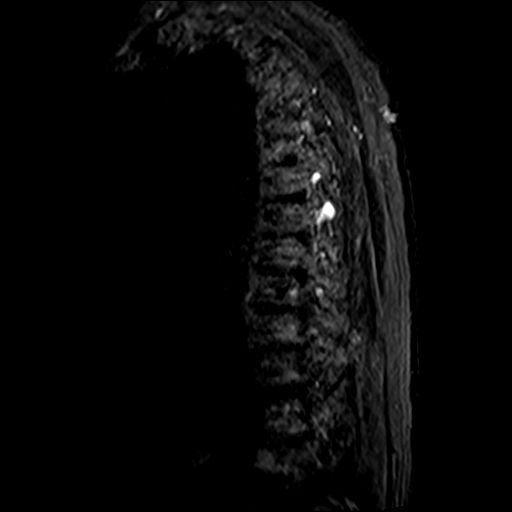
[im 9/25]
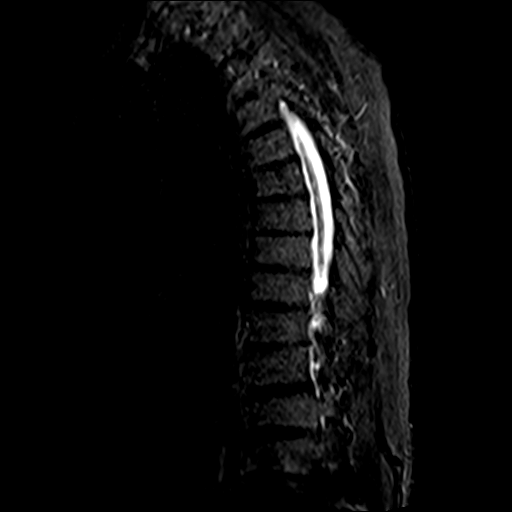
[im 13/25]
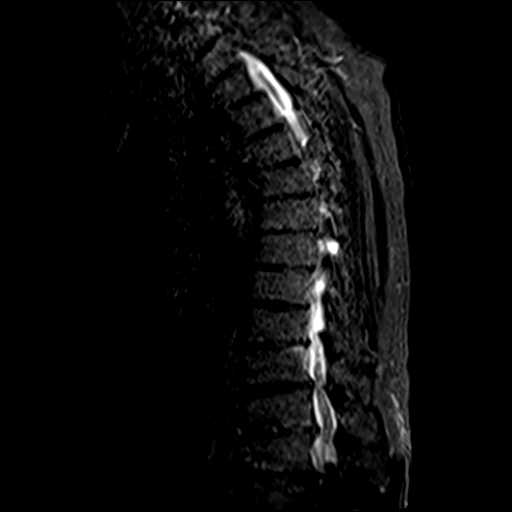
[im 17/25]
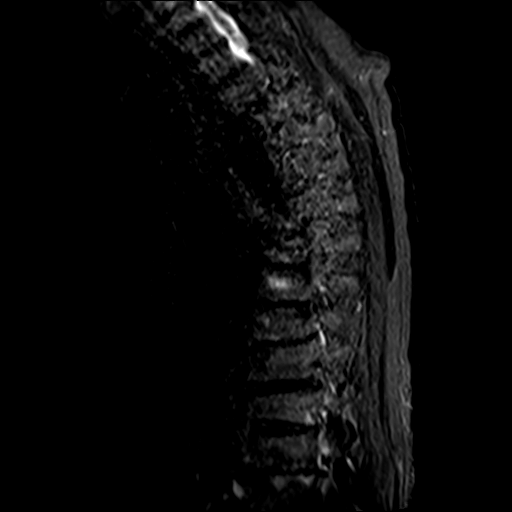
[im 21/25]
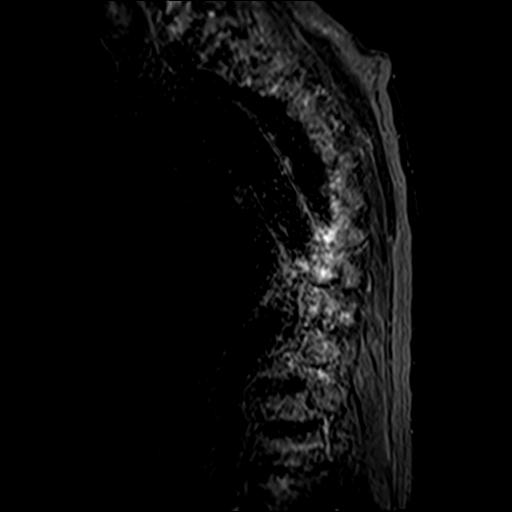
[im 25/25]
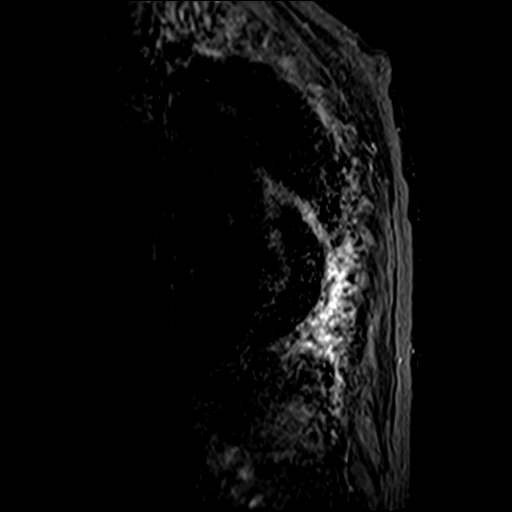

[Series 15: T2 · axial · 4.0mm · 0.59mm/px · z∈[-348,-84]mm · 11 of 39 slices shown (2 of 2)]
[im 1/39]
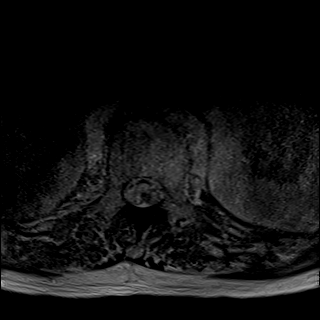
[im 4/39]
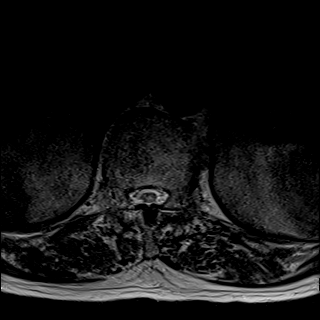
[im 8/39]
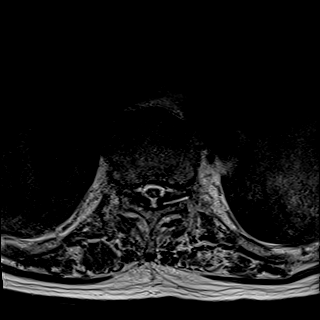
[im 12/39]
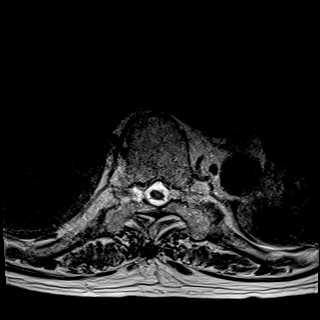
[im 16/39]
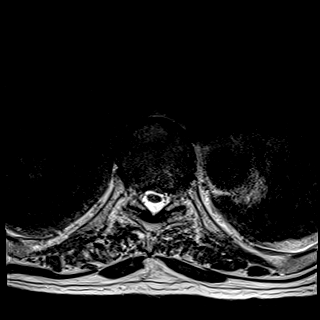
[im 20/39]
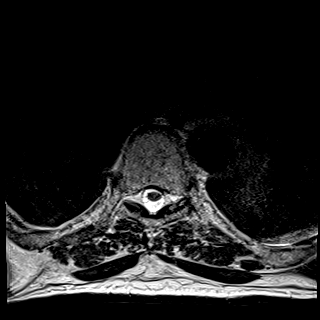
[im 23/39]
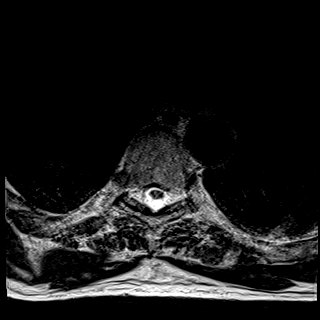
[im 27/39]
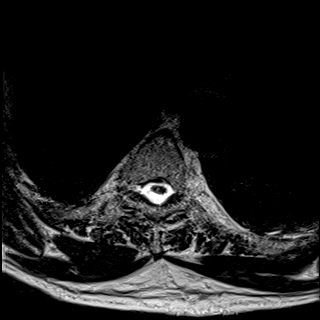
[im 31/39]
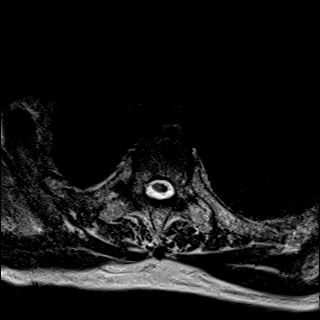
[im 35/39]
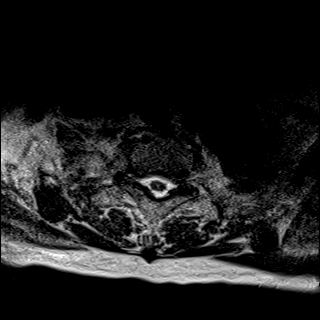
[im 39/39]
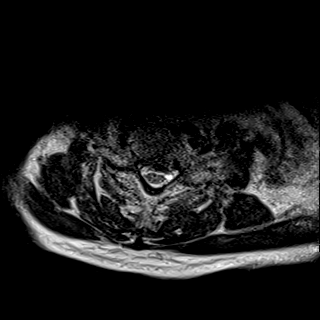

[Series 16: GRE · axial · 4.0mm · 0.37mm/px · z∈[-348,-282]mm · 2 of 39 slices shown]
[im 1/39]
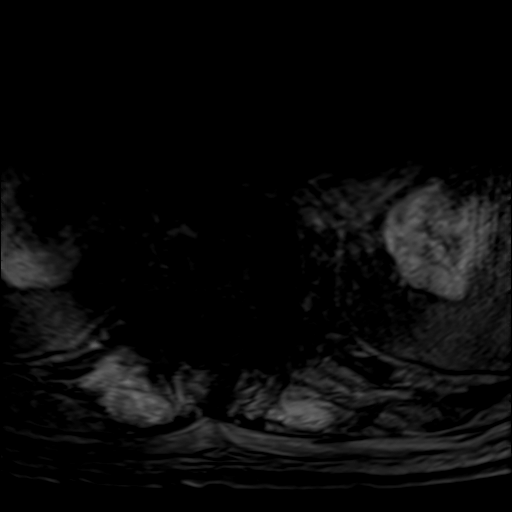
[im 8/39]
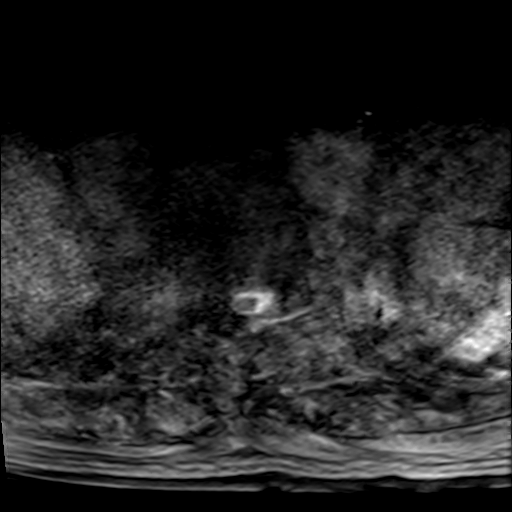

[39 of 48 positions shown; findings below may reference images not displayed]

FINDINGS: Alignment:  Thoracic curvature convex to the right.

Vertebrae: Old inferior endplate compression fracture at T12 which
is healed without edema. No other regional fracture or focal bone
finding.

Cord:  No cord compression or primary cord lesion.

Paraspinal and other soft tissues: Negative

Disc levels:

Ordinary thoracic region degenerative disc disease with loss of disc
height and mild disc bulges. No disc herniation or compressive
stenosis of the canal or foramina. Ordinary facet osteoarthritis.
IMPRESSION: Old inferior endplate fracture at T12 which is healed without edema.

Thoracic curvature convex to the right. Ordinary age related
degenerative disc disease throughout the thoracic region but without
compressive narrowing of the canal or foramina. Ordinary facet
osteoarthritis. Findings could certainly relate to generalized back
with

## 2019-07-06 IMAGING — MR MRI LUMBAR SPINE WITHOUT CONTRAST
5 of 6 series · 32 of 48 positions shown · non-contrast
Comparison: Radiography [DATE]

CLINICAL DATA: New onset leg and back pain. Previous lumbar fusion.

EXAM:
MRI LUMBAR SPINE WITHOUT CONTRAST
TECHNIQUE: Multiplanar, multisequence MR imaging of the lumbar spine was
performed. No intravenous contrast was administered.

[Series 8: T2 · sagittal · 4.0mm · 1.02mm/px · 5 of 19 slices shown (1 of 2)]
[im 1/19]
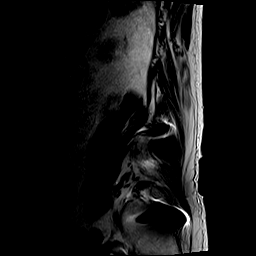
[im 5/19]
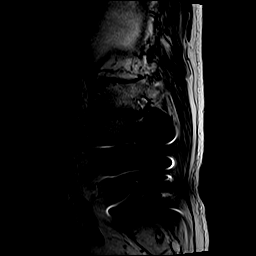
[im 10/19]
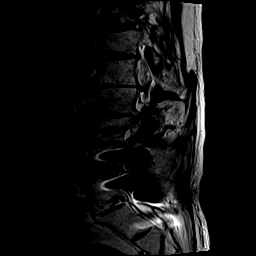
[im 14/19]
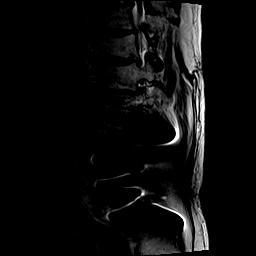
[im 19/19]
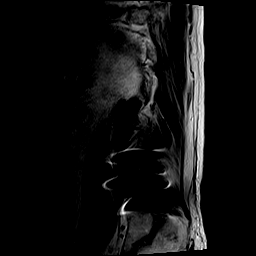

[Series 9: T1 · sagittal · 4.0mm · 1.02mm/px · 5 of 19 slices shown (1 of 2)]
[im 1/19]
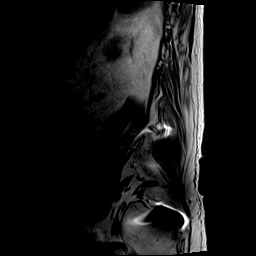
[im 5/19]
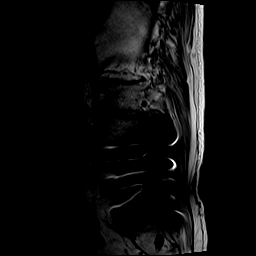
[im 10/19]
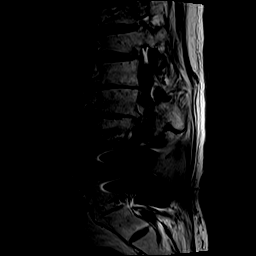
[im 14/19]
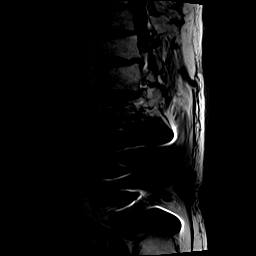
[im 19/19]
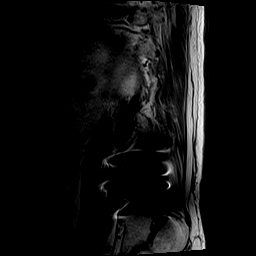

[Series 10: STIR · sagittal · 4.0mm · 0.51mm/px · 4 of 19 slices shown]
[im 1/19]
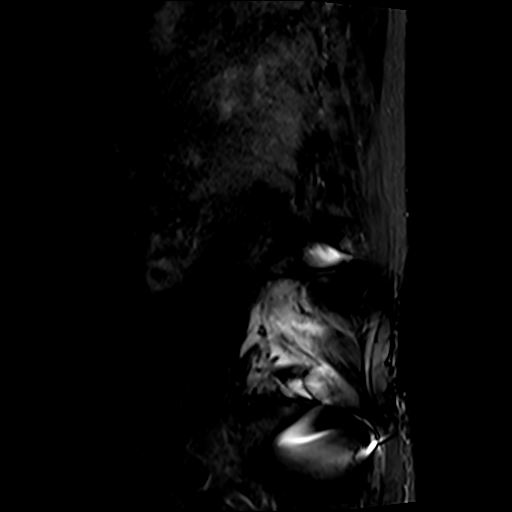
[im 4/19]
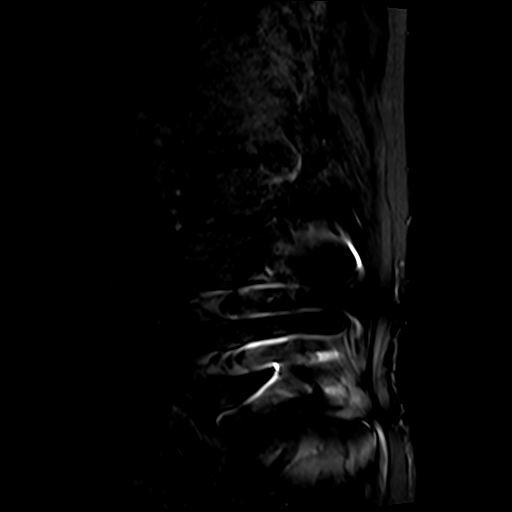
[im 8/19]
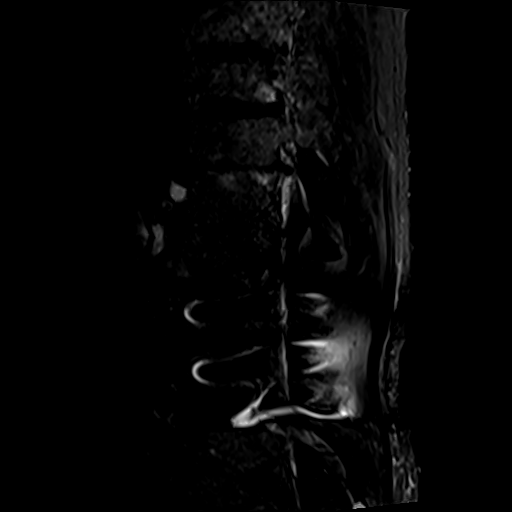
[im 11/19]
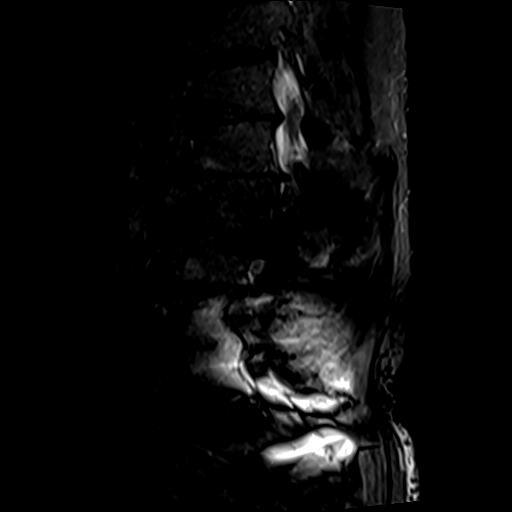

[Series 11: T2 · axial · 4.0mm · 0.78mm/px · z∈[-505,-262]mm · 9 of 42 slices shown (2 of 2)]
[im 1/42]
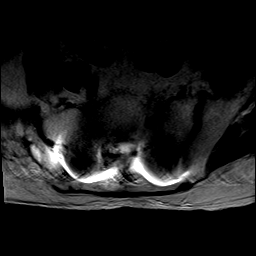
[im 7/42]
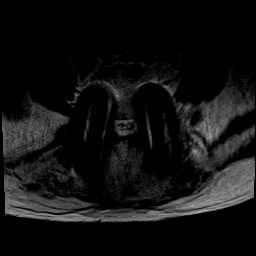
[im 14/42]
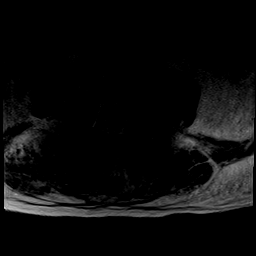
[im 18/42]
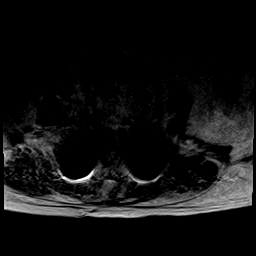
[im 21/42]
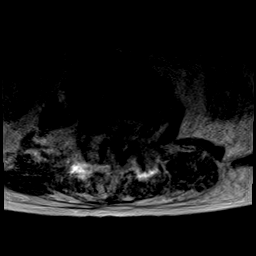
[im 24/42]
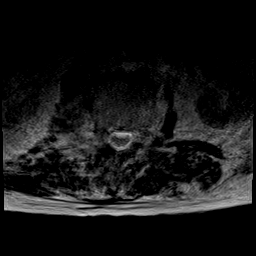
[im 28/42]
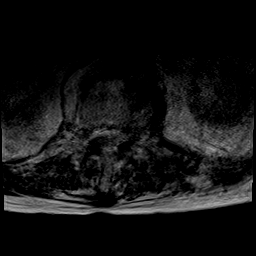
[im 35/42]
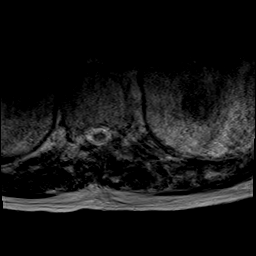
[im 42/42]
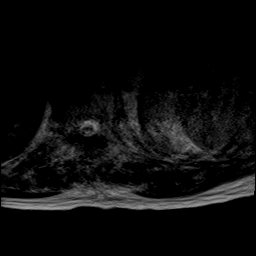

[Series 12: T1 · axial · 4.0mm · 0.39mm/px · z∈[-505,-262]mm · 9 of 42 slices shown (2 of 2)]
[im 1/42]
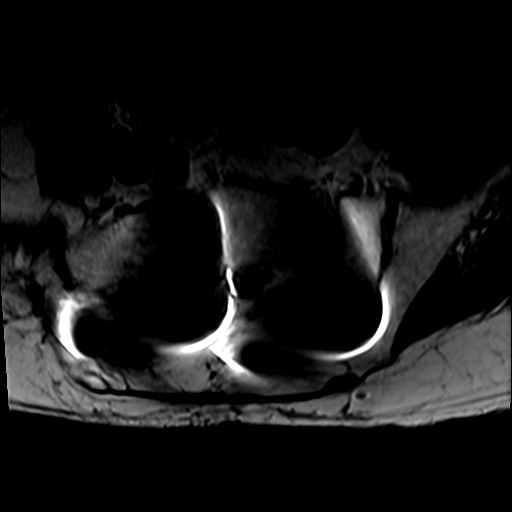
[im 7/42]
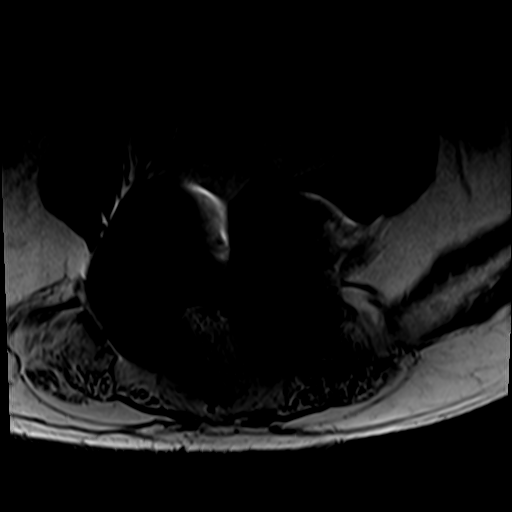
[im 14/42]
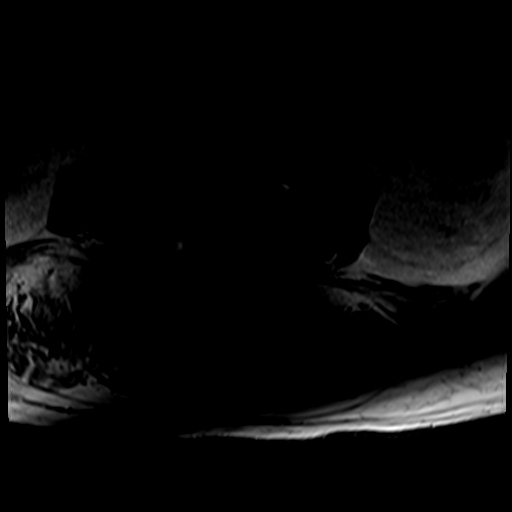
[im 18/42]
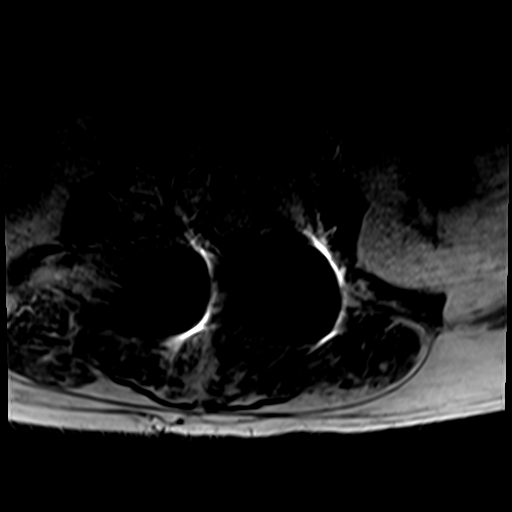
[im 21/42]
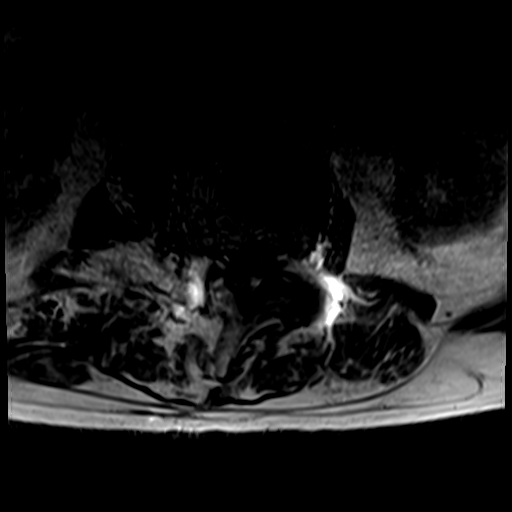
[im 24/42]
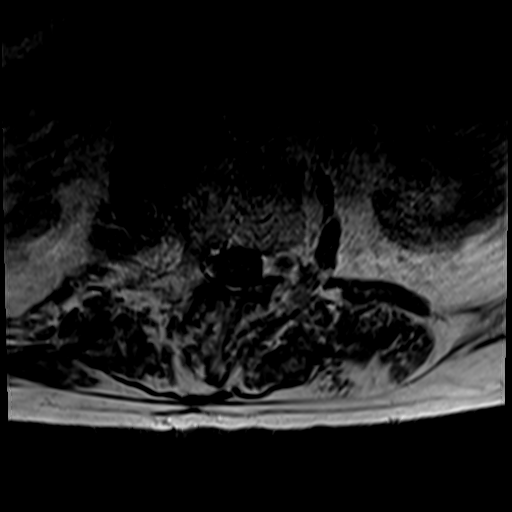
[im 28/42]
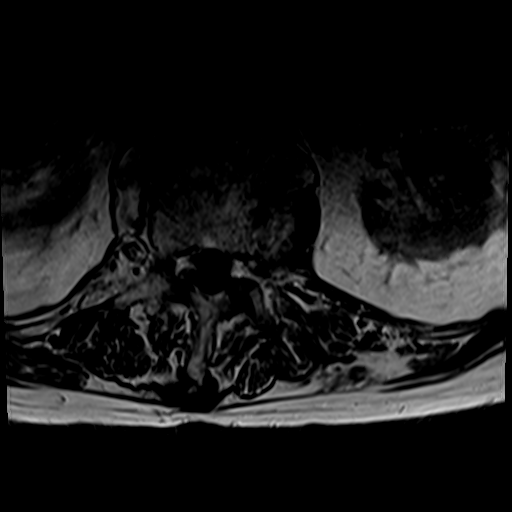
[im 35/42]
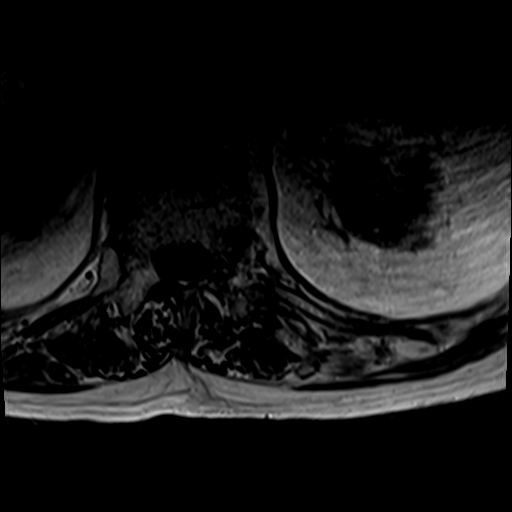
[im 42/42]
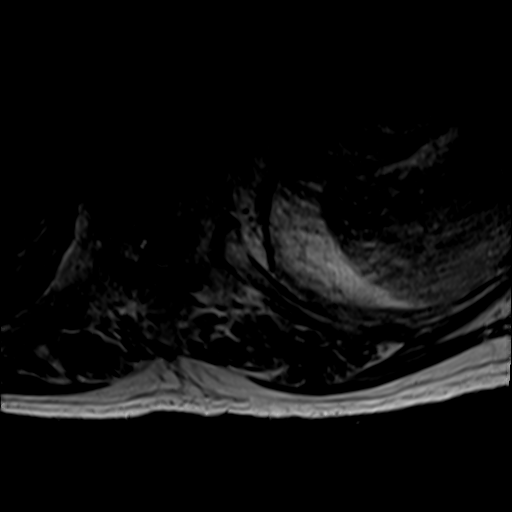

[32 of 48 positions shown; findings below may reference images not displayed]

FINDINGS: Segmentation:  5 lumbar type vertebral bodies.

Alignment:  Mild curvature convex to the left.

Vertebrae:  Previous discectomy and fusion procedure L4 to sacrum.

Conus medullaris and cauda equina: Conus extends to the L1 level.
Conus and cauda equina appear normal.

Paraspinal and other soft tissues: Negative

Disc levels:

T12-L1: Disc bulge. Narrowing of the subarachnoid space but no
neural compression. Old healed inferior endplate deformity at T12.

L1-2: Disc degeneration with loss of disc height. Endplate
osteophytes and bulging of the disc. Facet osteoarthritis. Canal
narrowing with some potential for neural compression in the lateral
recesses and foramina.

L2-3: Bulging of the disc. Facet hypertrophy. Moderate
multifactorial stenosis that could cause neural compression.

L3-4: Disc degeneration with disc space narrowing. Limited
information based on pronounced artifact from fusion hardware.

L4 to sacrum: Previous lumbar fusion surgery. Fusion is solid.
Limited information based on fusion hardware artifact.
IMPRESSION: Old healed inferior endplate fracture at T12. No acute lumbar region
fracture. Solid union in the fusion segment from L4 to the sacrum.
Limited detail in that region because of artifact from fusion
hardware. This also makes evaluation of L3-4 markedly suboptimal.

Degenerative disc disease and facet disease at L1-2 and L2-3 with
some degree of stenosis of the canal and lateral recesses. Detail is
poor because of motion. Some potential for neural compression at
those levels.

## 2019-07-06 IMAGING — US US RENAL
1 series · 14 of 25 positions shown · non-contrast
Comparison: [DATE]

CLINICAL DATA: Acute kidney injury

EXAM:
RENAL / URINARY TRACT ULTRASOUND COMPLETE

[Series 1: us renal · 14 of 40 slices shown]
[im 1/40]
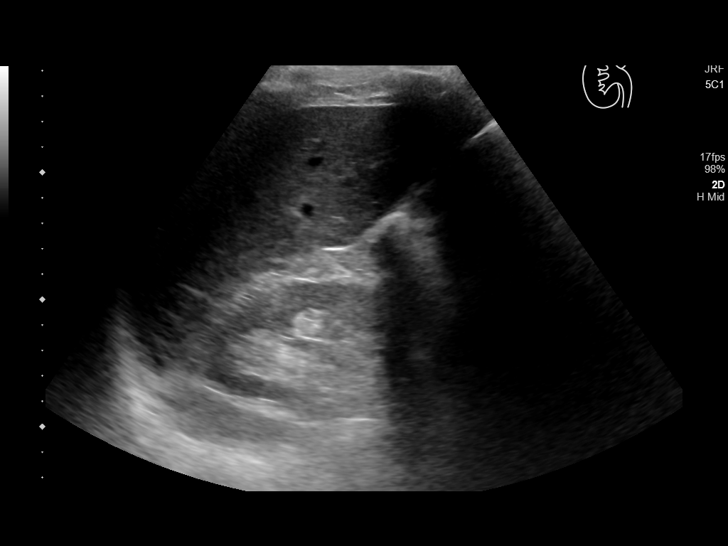
[im 4/40]
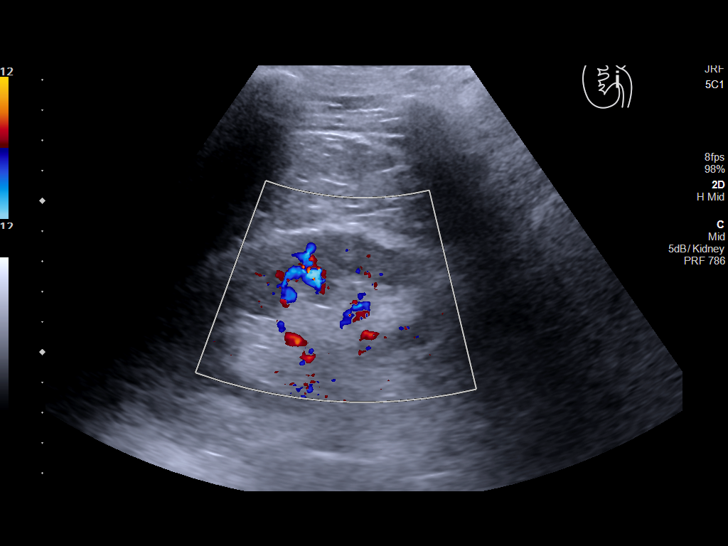
[im 7/40]
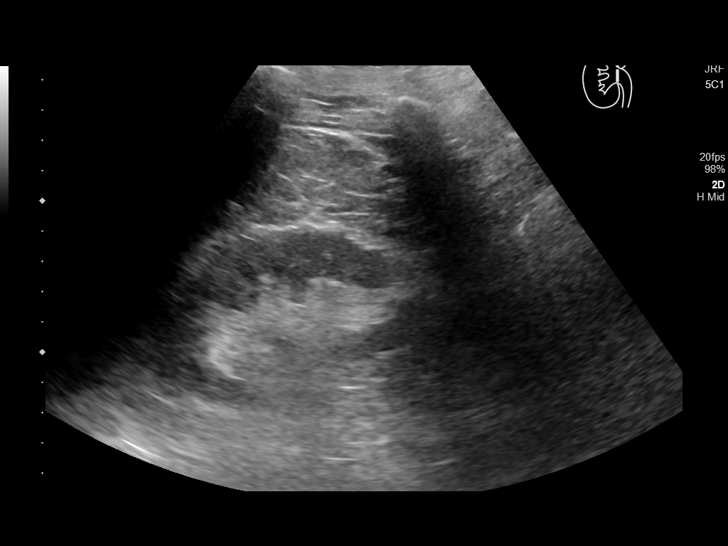
[im 10/40]
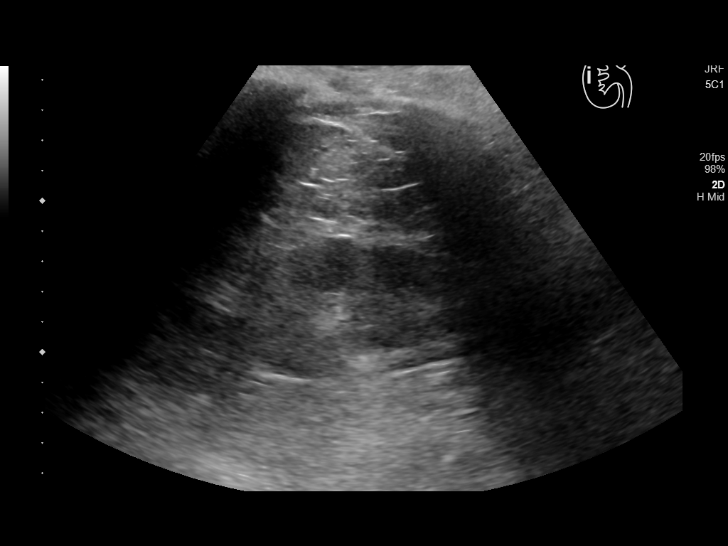
[im 14/40]
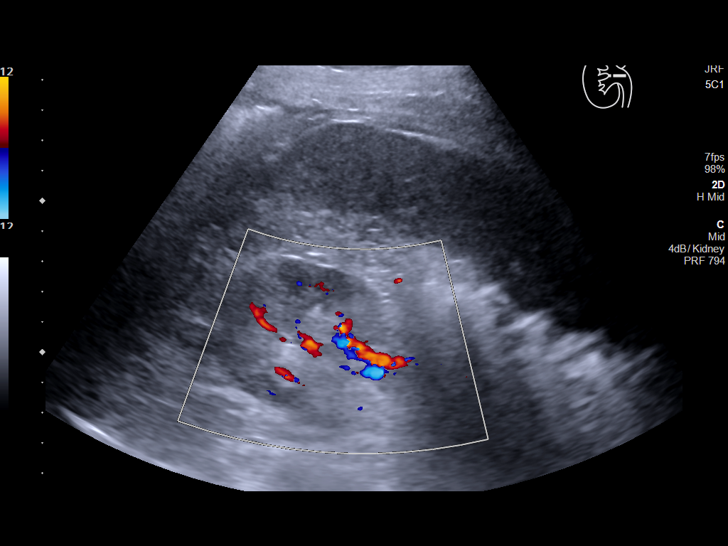
[im 15/40]
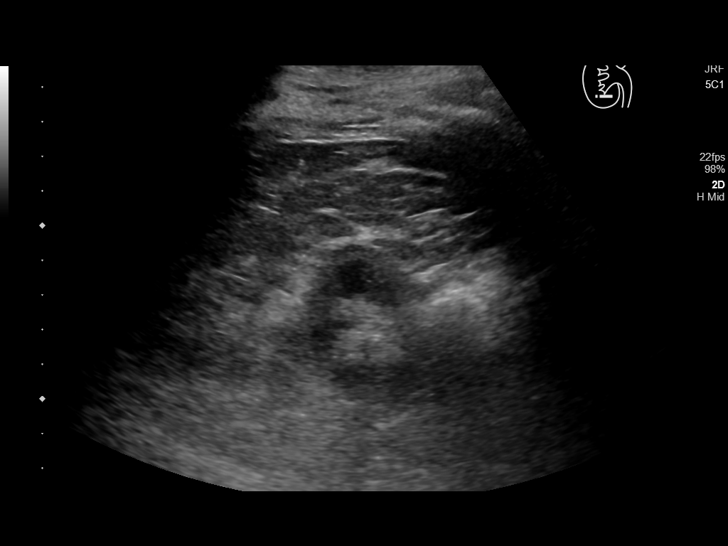
[im 18/40]
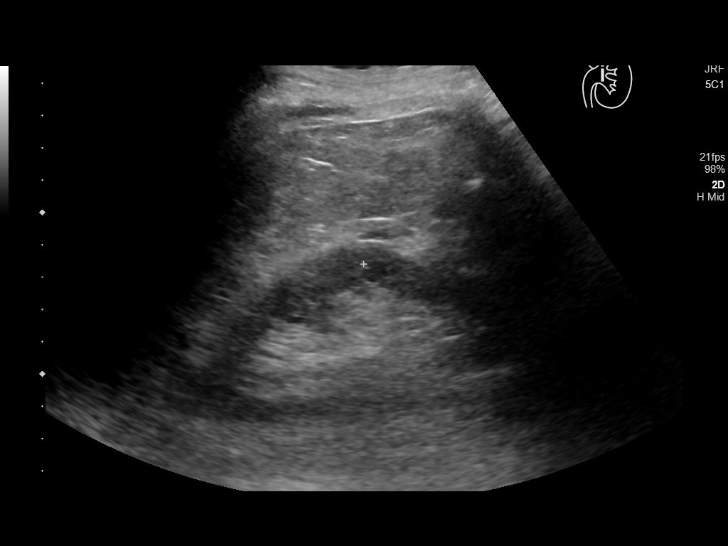
[im 22/40]
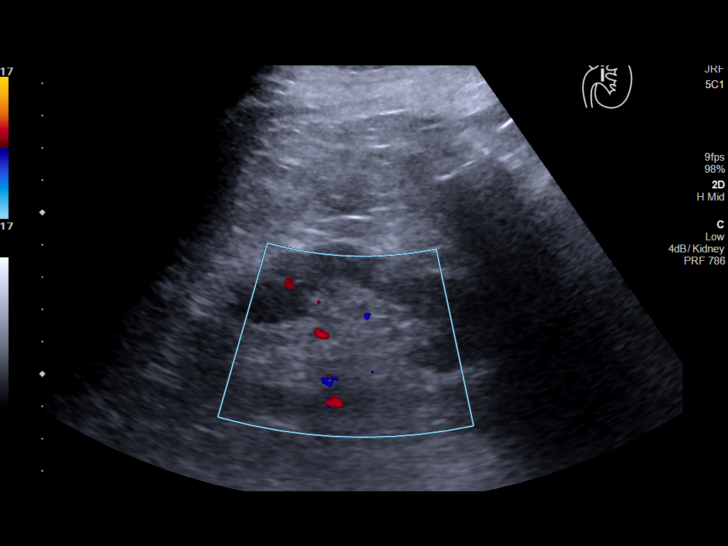
[im 25/40]
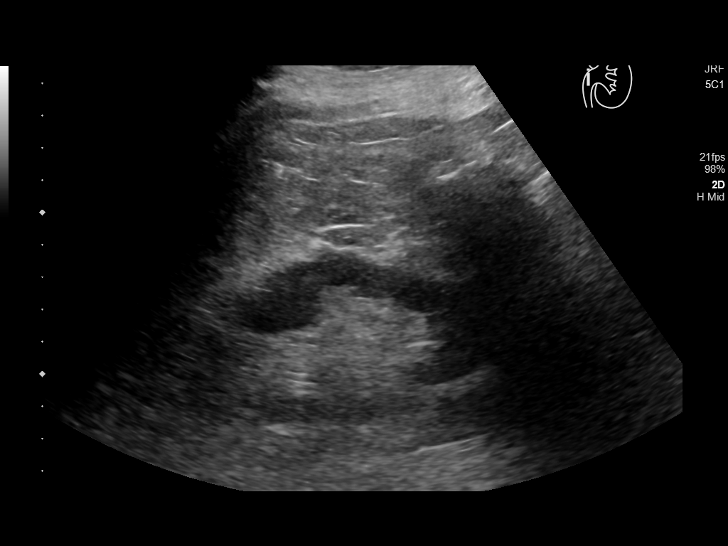
[im 27/40]
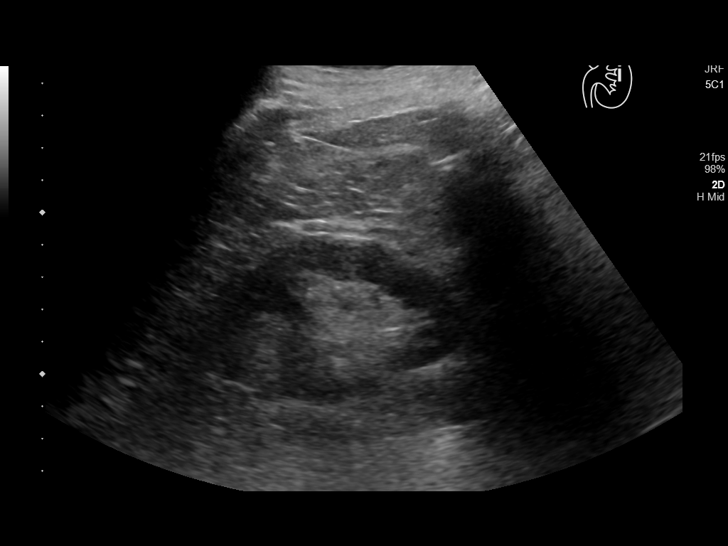
[im 30/40]
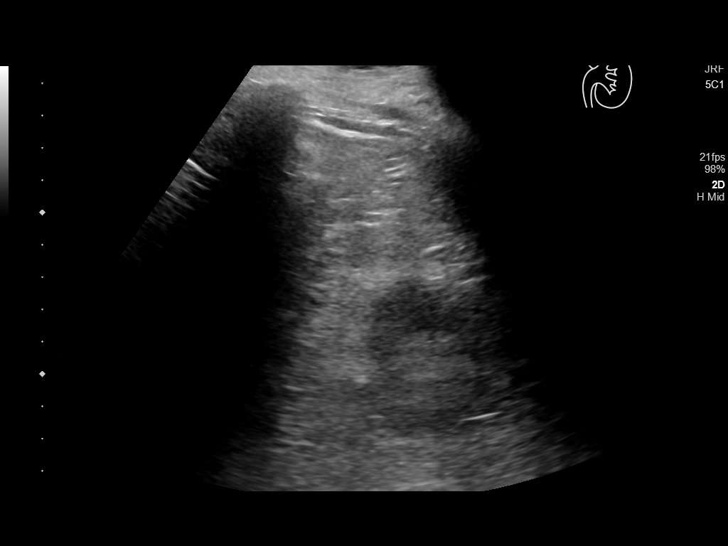
[im 33/40]
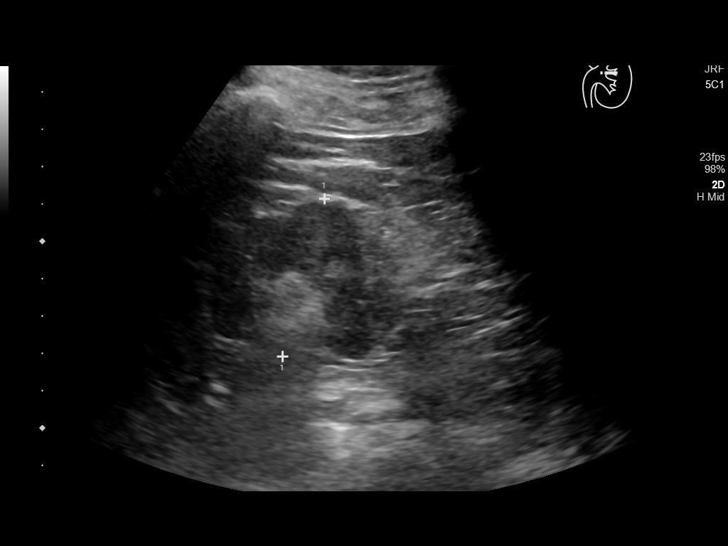
[im 36/40]
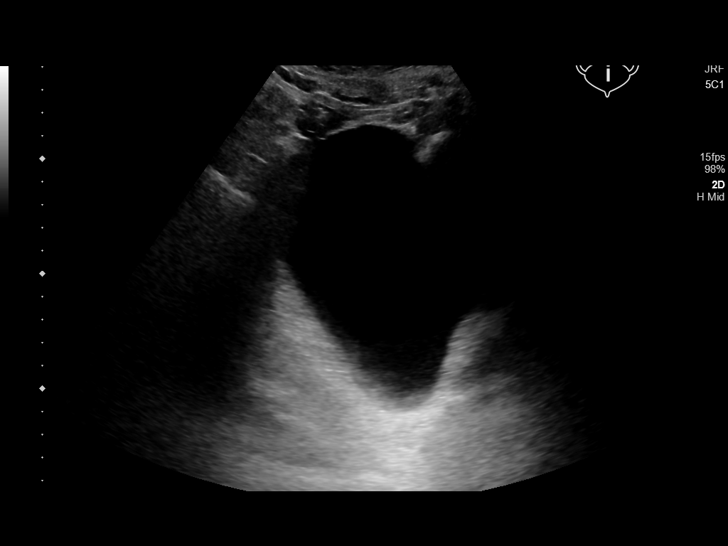
[im 40/40]
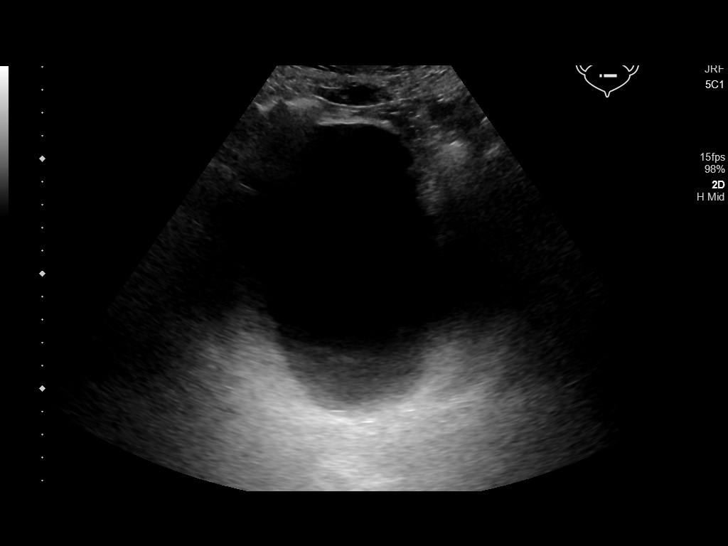

[14 of 25 positions shown; findings below may reference images not displayed]

FINDINGS: Right Kidney:

Renal measurements: 10.2 x 5.5 x 5.6 cm = volume: 162 mL .
Echogenicity within normal limits. No mass or hydronephrosis
visualized.

Left Kidney:

Renal measurements: 11.0 x 5.3 x 4.4 cm = volume: 133 mL.
Echogenicity within normal limits. No mass or hydronephrosis
visualized.

Bladder:

Appears normal for degree of bladder distention.
IMPRESSION: Normal study.  No hydronephrosis.

## 2019-07-06 MED ORDER — ACETAMINOPHEN 325 MG PO TABS
650.0000 mg | ORAL_TABLET | Freq: Four times a day (QID) | ORAL | Status: DC | PRN
  Administered 2019-07-07 – 2019-07-08 (×3): 650 mg via ORAL
  Filled 2019-07-06 (×3): qty 2

## 2019-07-06 MED ORDER — ACETAMINOPHEN 650 MG RE SUPP: 650.0000 mg | Freq: Four times a day (QID) | RECTAL | Status: DC | PRN

## 2019-07-06 MED ORDER — IPRATROPIUM-ALBUTEROL 20-100 MCG/ACT IN AERS
1.0000 | INHALATION_SPRAY | Freq: Four times a day (QID) | RESPIRATORY_TRACT | Status: DC
  Administered 2019-07-06 – 2019-07-08 (×8): 1 via RESPIRATORY_TRACT
  Filled 2019-07-06: qty 4

## 2019-07-06 MED ORDER — METOPROLOL SUCCINATE ER 50 MG PO TB24
50.0000 mg | ORAL_TABLET | Freq: Every day | ORAL | Status: DC
  Administered 2019-07-06 – 2019-07-08 (×3): 50 mg via ORAL
  Filled 2019-07-06 (×3): qty 1

## 2019-07-06 NOTE — Consult Note (Signed)
Cardiology Consultation:   Patient ID: Jacob Simpson; [id number]; 1930-11-21   Admit date: 07/05/2019 Date of Consult: 07/06/2019  Primary Care Provider: Valera Castle, MD Primary Cardiologist: Fletcher Anon   Patient Profile:   Jacob Simpson is a 85 y.o. male with a hx of severe aortic stenosis as outlined below, HFpEF, PAF, B-cell lymphoma in remission, CKD stage II, TIA, aortic aneurysm measuring 4.3 cm, and prior tobacco abuse who is being seen today for the evaluation of weakness/hypotension at the request of Dr. Brett Albino.  History of Present Illness:   Mr. Susman was previously followed in [place], [place], though more recently has established with [facility]. He has known severe aortic stenosis with a mean gradient of 42 mmHg, peak velocity 409 cm/sec, and valve area of 0.33 cm^2 by echo in 02/2019 with an EF of 60-65%, severely dilated left atrium and RVSP of 46 mmHg. He was most recently seen in our office on 06/25/2019 for follow up of his AS with continued SOB with minimal exertion and fatigue. He was scheduled to have his initial potential TAVR consultation today with plans for [facility] moving forward if he planned to move forward with intervention of his aortic valve. He has also been followed by neurology with continued memory loss with MRI of the brain on 07/01/2019 showing no reversible finding, attenuated left ICA suggesting high-grade stenosis in the neck.   Patient states his weakness and exertional shortness of breath are unchanged.  He denies any chest pain, worsening shortness of breath, palpitations, dizziness, presyncope, or syncope.Marland Kitchen  He presented to the ED on 07/05/2019 with significant low back pain with radiculopathy into the bilateral lower extremities with worsening lower extremity weakness.  He states he has been trying to increase his water intake though thinks this may not be enough.  Upon the patient's arrival to [facility] they were found to have initial BP in the 90s  trending to 78G systolic with most recent BP of 145/85 this morning status post IV fluids. Orthostatic vitals were not obtained. Oxygen saturation 95-100% on room air. EKG showed sinus rhythm without acute changes as below, CXR showed possible mild interstitial edema with stable vascular congestion and mild cardiomegaly. Labs showed hs-Tn 51-->53, SCr 2.21 (baseline ~ 1.3-1.4) trending to 1.80, K+ 3.9-->3.6, HGB 11.6-->13.2.  Following gentle IV hydration patient feels back pain with lower extremity weakness.  Past Medical History:  Diagnosis Date  . A-fib ([place])   . Aortic aneurysm ([place])   . Aortic stenosis   . Asthma   . CHF (congestive heart failure) ([place])   . Hammertoe   . Heart murmur   . HTN (hypertension)   . TIA (transient ischemic attack)     Past Surgical History:  Procedure Laterality Date  . BACK SURGERY  12/89  . CATARACT EXTRACTION W/ INTRAOCULAR LENS IMPLANT Right 12/00  . CATARACT EXTRACTION W/ INTRAOCULAR LENS IMPLANT Left 8/01  . foot sugery Right 6/03  . FOOT SURGERY Left 1/96   hammertoe  . KIDNEY STONE SURGERY  7/90  . ROTATOR CUFF REPAIR Right 1/98  . TOTAL KNEE ARTHROPLASTY Bilateral 1987     Home Meds: Prior to Admission medications   Medication Sig Start Date End Date Taking? Authorizing Provider  acetaminophen (TYLENOL) 500 MG tablet Take 1,000 mg by mouth every 6 (six) hours as needed for mild pain, moderate pain or fever.   Yes [provider]  apixaban (ELIQUIS) 2.5 MG TABS tablet Take 1 tablet (2.5 mg  total) by mouth 2 (two) times daily. 06/25/19  Yes Wellington Hampshire, MD  atorvastatin (LIPITOR) 40 MG tablet Take 40 mg by mouth daily.   Yes [provider]  azelastine (ASTELIN) 0.1 % nasal spray Place 1 spray into both nostrils 2 (two) times daily as needed for rhinitis or allergies. Use in each nostril as directed   Yes [provider]  cetirizine (ZYRTEC) 10 MG tablet Take 10 mg by mouth daily as needed for allergies.   Yes  [provider]  famotidine (PEPCID) 10 MG tablet Take 10 mg by mouth daily as needed for heartburn or indigestion.   Yes [provider]  fluticasone (FLOVENT HFA) 110 MCG/ACT inhaler Inhale 1 puff into the lungs 2 (two) times daily as needed (respiratory symptoms).    Yes [provider]  furosemide (LASIX) 40 MG tablet Take 1 tablet (40 mg total) by mouth daily. 03/11/19  Yes Hackney, Otila Kluver A, FNP  guaiFENesin (MUCINEX) 600 MG 12 hr tablet Take 600 mg by mouth 2 (two) times daily as needed for cough or to loosen phlegm.   Yes [provider]  meclizine (ANTIVERT) 12.5 MG tablet Take 12.5 mg by mouth 3 (three) times daily as needed for dizziness.   Yes [provider]  metoprolol succinate (TOPROL-XL) 50 MG 24 hr tablet Take 100 mg by mouth daily. Take with or immediately following a meal.    Yes [provider]  mirtazapine (REMERON) 7.5 MG tablet Take 7.5 mg by mouth at bedtime.   Yes [provider]  omeprazole (PRILOSEC) 20 MG capsule Take 20 mg by mouth daily as needed (stomach acid).   Yes [provider]  ondansetron (ZOFRAN-ODT) 8 MG disintegrating tablet Take 8 mg by mouth every 8 (eight) hours as needed for nausea or vomiting.   Yes [provider]    Inpatient Medications: Scheduled Meds: . apixaban  2.5 mg Oral BID  . atorvastatin  40 mg Oral Daily  . Ipratropium-Albuterol  1 puff Inhalation Q6H  . loratadine  10 mg Oral Daily  . mirtazapine  7.5 mg Oral QHS  . pantoprazole  40 mg Oral Daily  . sodium chloride flush  10 mL Intravenous Q12H   Continuous Infusions:  PRN Meds: acetaminophen **OR** acetaminophen, azelastine, famotidine, fluticasone, gabapentin, ondansetron **OR** ondansetron (ZOFRAN) IV, oxyCODONE, polyethylene glycol  Allergies:   Allergies  Allergen Reactions  . Penicillins Rash    Did it involve swelling of the face/tongue/throat, SOB, or low BP? Unknown Did it involve sudden  or severe rash/hives, skin peeling, or any reaction on the inside of your mouth or nose? Unknown Did you need to seek medical attention at a hospital or doctor's office? Unknown When did it last happen?Unknown If all above answers are "NO", may proceed with cephalosporin use.     Social History:   Social History   Socioeconomic History  . Marital status: Widowed    Spouse name: Not on file  . Number of children: Not on file  . Years of education: Not on file  . Highest education level: Not on file  Occupational History  . Not on file  Social Needs  . Financial resource strain: Not on file  . Food insecurity    Worry: Not on file    Inability: Not on file  . Transportation needs    Medical: Not on file    Non-medical: Not on file  Tobacco Use  . Smoking status: Former Research scientist (life sciences)  .  Smokeless tobacco: Never Used  . Tobacco comment: quit 1989  Substance and Sexual Activity  . Alcohol use: No  . Drug use: No  . Sexual activity: Not on file  Lifestyle  . Physical activity    Days per week: Not on file    Minutes per session: Not on file  . Stress: Not on file  Relationships  . Social Herbalist on phone: Not on file    Gets together: Not on file    Attends religious service: Not on file    Active member of club or organization: Not on file    Attends meetings of clubs or organizations: Not on file    Relationship status: Not on file  . Intimate partner violence    Fear of current or ex partner: Not on file    Emotionally abused: Not on file    Physically abused: Not on file    Forced sexual activity: Not on file  Other Topics Concern  . Not on file  Social History Narrative  . Not on file     Family History:   Family History  Problem Relation Age of Onset  . Heart disease Father     ROS:  Review of Systems  Constitutional: Positive for malaise/fatigue. Negative for chills, diaphoresis, fever and weight loss.  HENT: Positive for hearing loss.  Negative for congestion.   Eyes: Negative for discharge and redness.  Respiratory: Positive for shortness of breath. Negative for cough, hemoptysis, sputum production and wheezing.   Cardiovascular: Negative for chest pain, palpitations, orthopnea, claudication, leg swelling and PND.  Gastrointestinal: Negative for abdominal pain, blood in stool, heartburn, melena, nausea and vomiting.  Genitourinary: Negative for hematuria.  Musculoskeletal: Positive for back pain. Negative for falls and myalgias.  Skin: Negative for rash.  Neurological: Positive for weakness. Negative for dizziness, tingling, tremors, sensory change, speech change, focal weakness and loss of consciousness.  Endo/Heme/Allergies: Does not bruise/bleed easily.  Psychiatric/Behavioral: Negative for substance abuse. The patient is not nervous/anxious.   All other systems reviewed and are negative.     Physical Exam/Data:   Vitals:   07/05/19 1821 07/05/19 2013 07/06/19 0457 07/06/19 0830  BP:  (!) 155/94 124/89 (!) 145/85  Pulse:  89 90 84  Resp: 16  20 19   Temp:   98.3 F (36.8 C) 97.6 F (36.4 C)  TempSrc:   Oral Oral  SpO2:  98% 100% 100%  Weight:      Height:        Intake/Output Summary (Last 24 hours) at 07/06/2019 0942 Last data filed at 07/06/2019 3545 Gross per 24 hour  Intake 500 ml  Output 1000 ml  Net -500 ml   Filed Weights   07/05/19 1325 07/05/19 1812  Weight: 81.6 kg 86.7 kg   Body mass index is 28.24 kg/m.   Physical Exam: General: Elderly appearing and hard of hearing, well developed, well nourished, in no acute distress. Head: Normocephalic, atraumatic, sclera non-icteric, no xanthomas, nares without discharge.  Neck: Negative for carotid bruits. JVD not elevated. Lungs: Clear bilaterally to auscultation without wheezes, rales, or rhonchi. Breathing is unlabored. Heart: RRR with S1 S2. III/VI harsh systolic murmur best heard in the aortic area, no rubs, or gallops appreciated. Abdomen:  Soft, non-tender, non-distended with normoactive bowel sounds. No hepatomegaly. No rebound/guarding. No obvious abdominal masses. Msk:  Strength and tone appear normal for age. Extremities: No clubbing or cyanosis. No edema. Distal pedal pulses are 2+ and equal  bilaterally. Neuro: Alert and oriented X 3. No facial asymmetry. No focal deficit. Moves all extremities spontaneously. Psych:  Responds to questions appropriately with a normal affect.   EKG:  The EKG was personally reviewed and demonstrates: NSR, 72 bpm, rare PAC, baseline artifact, no acute st/t changes Telemetry:  Telemetry was personally reviewed and demonstrates: SR with frequent PACs and rare PVC  Weights: Filed Weights   07/05/19 1325 07/05/19 1812  Weight: 81.6 kg 86.7 kg    Relevant CV Studies: 2D Echo 02/25/2019: 1. The left ventricle has normal systolic function with an ejection fraction of 60-65%. The cavity size was normal. There is mildly increased left ventricular wall thickness. Left ventricular diastolic Doppler parameters are consistent with impaired relaxation.  2. The right ventricle has normal systolic function. The cavity was normal. There is no increase in right ventricular wall thickness. Right ventricular systolic pressure is moderately elevated with an estimated pressure of 46.0 mmHg.  3. Left pleural effusion noted  4. Mitral valve regurgitation is moderate by color flow Doppler.  5. Left atrial size was severely dilated.  6. Severe calcifcation of the aortic valve. severe stenosis of the aortic valve. Mean gradient 42 mm Hg, peak velocity 409 cm/sec  7. The tricuspid valve is grossly normal.  Laboratory Data:  Chemistry Recent Labs  Lab 07/05/19 1330 07/06/19 0503  NA 139 138  K 3.9 3.6  CL 110 109  CO2 20* 21*  GLUCOSE 188* 125*  BUN 36* 32*  CREATININE 2.21* 1.80*  CALCIUM 7.8* 7.9*  GFRNONAA 26* 33*  GFRAA 30* 38*  ANIONGAP 9 8    No results for input(s): PROT, ALBUMIN, AST, ALT,  ALKPHOS, BILITOT in the last 168 hours. Hematology Recent Labs  Lab 07/05/19 1330 07/06/19 0503  WBC 7.5 9.4  RBC 3.69* 4.13*  HGB 11.6* 13.2  HCT 35.0* 40.1  MCV 94.9 97.1  MCH 31.4 32.0  MCHC 33.1 32.9  RDW 12.4 12.6  PLT 95* 104*   Cardiac EnzymesNo results for input(s): TROPONINI in the last 168 hours. No results for input(s): TROPIPOC in the last 168 hours.  BNPNo results for input(s): BNP, PROBNP in the last 168 hours.  DDimer No results for input(s): DDIMER in the last 168 hours.  Radiology/Studies:  Dg Chest 2 View  Result Date: 07/05/2019 IMPRESSION: 1. Probable mild interstitial pulmonary edema with stable pulmonary vascular congestion and mild cardiomegaly. 2. Mild left basilar atelectasis and minimal right basilar atelectasis. Electronically Signed   By: Claudie Revering M.D.   On: 07/05/2019 14:02   Dg Lumbar Spine 2-3 Views  Result Date: 07/05/2019 IMPRESSION: 1. No acute abnormality. 2. Stable postsurgical and degenerative changes. Electronically Signed   By: Claudie Revering M.D.   On: 07/05/2019 14:05    Assessment and Plan:   1. Hypotension/weakness: -Patient states he is at his approximate baseline -Likely secondary to dehydration exacerbated by severe aortic stenosis -Improved following gentle IV fluids -Ambulate with PT -Improved   2. Severe aortic stenosis: -Recent echo from 02/2019 as above -IM has ordered repeat echo this admission which has already been performed and awaiting read -He was initially scheduled for his first TAVR consultation today, which will need to be rescheduled -Would be cautious not to over hydrate given his severe aortic stenosis  3. Elevated hs-Tn: -Minimally elevated with a delta of 2 (51-->53) consistent of ~ 0.05-0.06 by the old test -He will need a R/LHC as an outpatient once he has been evaluated by the TAVR clinic  and committed to proceding  -Eliquis in place of ASA  4. Acute on CKD stage II: -Improving with IV fluids  -Baseline SCr ~1.3-1.4 with an admission SCr of 2.21, currently improved to 1.8  5. PAF: -Maintaining sinus rhythm with frequent PVCs -Continue Eliquis 2.5 mg bid given SCr > 1.5 and age > 8 -Resume metoprolol   6. Anemia: -Stable  7. HLD: -Lipitor -LDL of 53 from 04/2018  8. Memory loss: -MRI brain unrevealing this month -Per neurology   9.  Low back/lower extremity pain/lower extremity weakness: -This is the initial reason for the patient presenting to the ED per his report -Work-up per internal medicine   For questions or updates, please contact [facility] Please consult [email or web address] for contact info under Cardiology/STEMI.   Signed, Christell Faith, PA-C [place] Pager: [phone] 07/06/2019, 9:42 AM

## 2019-07-06 NOTE — Progress Notes (Signed)
Spoke to patient's daughter [name] at [phone], she mentioned that her leg pain because of leg pain patient came to emergency room, according to them leg pain is new, x-ray of back showed L4-L5 bone fusion, 20% height loss at T12, she wanted MRI of thoracic and lumbosacral spine while he is here.  Also wondering about appointment with Dr[facility]Arida at [facility] regarding TAVR consult.

## 2019-07-06 NOTE — Progress Notes (Addendum)
Patient's belongings sent up by volunteer. Hearing aids with case and charger, toothbrush, tshirt, and underwear in bag. Belongings given to patient.   Daughter [name] updated over the phone.

## 2019-07-06 NOTE — Progress Notes (Signed)
Update daughter Marlaine Hind about patient's current condition.

## 2019-07-06 NOTE — Progress Notes (Signed)
*  PRELIMINARY RESULTS* Echocardiogram 2D Echocardiogram has been performed.  Jacob Simpson 07/06/2019, 8:37 AM

## 2019-07-06 NOTE — Telephone Encounter (Signed)
I spoke with the pt's daughter, [name], and made her aware that we are aware that her father is currently admitted at [facility].  The pt was scheduled for initial outpatient TAVR consult today with  Dr Angelena Form but this has been cancelled due to admission.  The TAVR team will continue to follow the pt's chart during admission.

## 2019-07-06 NOTE — TOC Initial Note (Signed)
Transition of Care [facility]) - Initial/Assessment Note    Patient Details  Name: Jacob Simpson MRN: [id number] Date of Birth: Mar 18, 1930  Transition of [facility] CM/SW Contact:    Latanya Maudlin, RN Phone Number: 07/06/2019, 1:51 PM  Clinical Narrative:  [facility] team consulted to assist with any disposition needs. Patient from home alone, both of his daughters are RN and they check on him frequently. As patient is mildly confused and very hard of hearing I conducted most of my assessment with the daughter, [name]. Per [name], Mr Kozikowski is very independent at home. He is able to complete all activities of daily living with no assistance. He is still active in managing his home and lawn. He had home health with [facility] back in March but they feel it was not particularly helpful or necessary. PT is consulted here. They would use [facility] again if needed. Patient has a cane and walker at home but does not often have to use it. Daughter provide transportation. PCP is [facility]. Uses [facility] without issues.               Expected Discharge Plan: Home/Self Care Barriers to Discharge: Continued Medical Work up   Patient Goals and CMS Choice        Expected Discharge Plan and Services Expected Discharge Plan: Home/Self Care   Discharge Planning Services: CM Consult   Living arrangements for the past 2 months: Single Family Home                                      Prior Living Arrangements/Services Living arrangements for the past 2 months: Single Family Home Lives with:: Self Patient language and need for interpreter reviewed:: Yes Do you feel safe going back to the place where you live?: Yes      Need for Family Participation in Patient Care: No (Comment)   Current home services: DME Criminal Activity/Legal Involvement Pertinent to Current Situation/Hospitalization: No - Comment as needed  Activities of Daily Living Home Assistive Devices/Equipment: Cane (specify quad or  straight) ADL Screening (condition at time of admission) Patient's cognitive ability adequate to safely complete daily activities?: Yes Is the patient deaf or have difficulty hearing?: Yes Does the patient have difficulty seeing, even when wearing glasses/contacts?: No Does the patient have difficulty concentrating, remembering, or making decisions?: Yes Patient able to express need for assistance with ADLs?: Yes Does the patient have difficulty dressing or bathing?: No Independently performs ADLs?: Yes (appropriate for developmental age) Does the patient have difficulty walking or climbing stairs?: No Weakness of Legs: Both Weakness of Arms/Hands: Both  Permission Sought/Granted                  Emotional Assessment Appearance:: Appears stated age Attitude/Demeanor/Rapport: Gracious Affect (typically observed): Accepting Orientation: : Oriented to Self, Oriented to Place      Admission diagnosis:  Weakness [R53.1] Acute lumbar back pain [M54.5] Acute kidney injury (nontraumatic) (HCC) [N17.9] Hypotension, unspecified hypotension type [I95.9] Patient Active Problem List   Diagnosis Date Noted  . Hypotension 07/05/2019  . HTN (hypertension) 03/10/2019  . Aortic stenosis, severe 03/10/2019  . CHF (congestive heart failure) ([place]) 02/24/2019  . Pain in limb 09/28/2013  . Arthritis of foot, degenerative 09/28/2013   PCP:  Valera Castle, MD Pharmacy:   [facility] [place]  [address] [place] [facility] [place] [address] Phone: [phone] Fax: [phone]  [facility] [place], [place] [address] [place] [address] Phone: [phone] Fax: [phone]     Social Determinants of Health (SDOH) Interventions    Readmission Risk Interventions Readmission Risk Prevention Plan 07/06/2019  Transportation Screening Complete  PCP or Specialist  Appt within 5-7 Days Complete  Home Care Screening Complete  Medication Review (RN CM) Complete  Some recent data might be hidden

## 2019-07-06 NOTE — Progress Notes (Deleted)
[facility] Consult Note  No chief complaint on file.  History of Present Illness: 85 yo male with history of aortic stenosis, HTN, prior TIA and  Primary Care Physician: Valera Castle, MD Primary Cardiologist: *** Referring Cardiologist: ***  Past Medical History:  Diagnosis Date  . A-fib ([place])   . Aortic aneurysm ([place])   . Aortic stenosis   . Asthma   . CHF (congestive heart failure) ([place])   . Hammertoe   . Heart murmur   . HTN (hypertension)   . TIA (transient ischemic attack)     Past Surgical History:  Procedure Laterality Date  . BACK SURGERY  12/89  . CATARACT EXTRACTION W/ INTRAOCULAR LENS IMPLANT Right 12/00  . CATARACT EXTRACTION W/ INTRAOCULAR LENS IMPLANT Left 8/01  . foot sugery Right 6/03  . FOOT SURGERY Left 1/96   hammertoe  . KIDNEY STONE SURGERY  7/90  . ROTATOR CUFF REPAIR Right 1/98  . TOTAL KNEE ARTHROPLASTY Bilateral 1987    No current facility-administered medications for this visit.    No current outpatient medications on file.   Facility-Administered Medications Ordered in Other Visits  Medication Dose Route Frequency Provider Last Rate Last Dose  . acetaminophen (TYLENOL) tablet 650 mg  650 mg Oral Q6H PRN Mayo, Pete Pelt, MD       Or  . acetaminophen (TYLENOL) suppository 650 mg  650 mg Rectal Q6H PRN Mayo, Pete Pelt, MD      . apixaban [facility]) tablet 2.5 mg  2.5 mg Oral BID Mayo, Pete Pelt, MD   2.5 mg at 07/05/19 2238  . atorvastatin (LIPITOR) tablet 40 mg  40 mg Oral Daily Mayo, Pete Pelt, MD   40 mg at 07/05/19 1954  . azelastine (ASTELIN) 0.1 % nasal spray 1 spray  1 spray Each Nare BID PRN Mayo, Pete Pelt, MD      . famotidine (PEPCID) tablet 10 mg  10 mg Oral Daily PRN Mayo, Pete Pelt, MD      . fluticasone (FLOVENT HFA) 110 MCG/ACT inhaler 1 puff  1 puff Inhalation BID PRN Mayo, Pete Pelt, MD      . gabapentin (NEURONTIN) capsule 100 mg  100 mg Oral TID PRN Mayo, Pete Pelt, MD      . ipratropium-albuterol  (DUONEB) 0.5-2.5 (3) MG/3ML nebulizer solution 3 mL  3 mL Nebulization Q6H Mayo, Pete Pelt, MD   3 mL at 07/05/19 2021  . loratadine (CLARITIN) tablet 10 mg  10 mg Oral Daily Mayo, Pete Pelt, MD   10 mg at 07/05/19 1954  . mirtazapine (REMERON) tablet 7.5 mg  7.5 mg Oral QHS Mayo, Pete Pelt, MD   7.5 mg at 07/05/19 2238  . ondansetron (ZOFRAN) tablet 4 mg  4 mg Oral Q6H PRN Mayo, Pete Pelt, MD       Or  . ondansetron [facility]) injection 4 mg  4 mg Intravenous Q6H PRN Mayo, Pete Pelt, MD      . oxyCODONE (Oxy IR/ROXICODONE) immediate release tablet 5 mg  5 mg Oral Q4H PRN Mayo, Pete Pelt, MD   5 mg at 07/06/19 0727  . pantoprazole (PROTONIX) EC tablet 40 mg  40 mg Oral Daily Mayo, Pete Pelt, MD   40 mg at 07/05/19 1954  . polyethylene glycol (MIRALAX / GLYCOLAX) packet 17 g  17 g Oral Daily PRN Mayo, Pete Pelt, MD      . sodium chloride flush (NS) 0.9 % injection 10 mL  10 mL Intravenous Q12H Mayo,  Pete Pelt, MD   10 mL at 07/05/19 2240    Allergies  Allergen Reactions  . Penicillins Rash    Did it involve swelling of the face/tongue/throat, SOB, or low BP? Unknown Did it involve sudden or severe rash/hives, skin peeling, or any reaction on the inside of your mouth or nose? Unknown Did you need to seek medical attention at a hospital or doctor's office? Unknown When did it last happen?Unknown If all above answers are "NO", may proceed with cephalosporin use.     Social History   Socioeconomic History  . Marital status: Widowed    Spouse name: Not on file  . Number of children: Not on file  . Years of education: Not on file  . Highest education level: Not on file  Occupational History  . Not on file  Social Needs  . Financial resource strain: Not on file  . Food insecurity    Worry: Not on file    Inability: Not on file  . Transportation needs    Medical: Not on file    Non-medical: Not on file  Tobacco Use  . Smoking status: Former Research scientist (life sciences)  . Smokeless tobacco: Never  Used  . Tobacco comment: quit 1989  Substance and Sexual Activity  . Alcohol use: No  . Drug use: No  . Sexual activity: Not on file  Lifestyle  . Physical activity    Days per week: Not on file    Minutes per session: Not on file  . Stress: Not on file  Relationships  . Social Herbalist on phone: Not on file    Gets together: Not on file    Attends religious service: Not on file    Active member of club or organization: Not on file    Attends meetings of clubs or organizations: Not on file    Relationship status: Not on file  . Intimate partner violence    Fear of current or ex partner: Not on file    Emotionally abused: Not on file    Physically abused: Not on file    Forced sexual activity: Not on file  Other Topics Concern  . Not on file  Social History Narrative  . Not on file    Family History  Problem Relation Age of Onset  . Heart disease Father     Review of Systems:  As stated in the HPI and otherwise negative.   There were no vitals taken for this visit.  Physical Examination: General: Well developed, well nourished, NAD  HEENT: OP clear, mucus membranes moist  SKIN: warm, dry. No rashes. Neuro: No focal deficits  Musculoskeletal: Muscle strength 5/5 all ext  Psychiatric: Mood and affect normal  Neck: No JVD, no carotid bruits, no thyromegaly, no lymphadenopathy.  Lungs:Clear bilaterally, no wheezes, rhonci, crackles Cardiovascular: Regular rate and rhythm. *** Loud, harsh, late peaking systolic murmur.  Abdomen:Soft. Bowel sounds present. Non-tender.  Extremities: *** No lower extremity edema. Pulses are 2 + in the bilateral DP/PT.  EKG:  EKG {ACTION; IS/IS HDQ:[id number]} ordered today. The ekg ordered today demonstrates ***  Echo ***  Recent Labs: 02/25/2019: B Natriuretic Peptide 873.0 07/06/2019: BUN 32; Creatinine, Ser 1.80; Hemoglobin 13.2; Platelets 104; Potassium 3.6; Sodium 138   Lipid Panel No results found for: CHOL, TRIG,  HDL, CHOLHDL, VLDL, LDLCALC, LDLDIRECT   Wt Readings from Last 3 Encounters:  07/05/19 191 lb 3.2 oz (86.7 kg)  06/25/19 193 lb (87.5 kg)  05/14/19 191  lb 4 oz (86.8 kg)     Other studies Reviewed: Additional studies/ records that were reviewed today include: ***. Review of the above records demonstrates: ***   Assessment and Plan:   1. Severe Aortic Valve Stenosis: *** has severe, stage D aortic valve stenosis. I have personally reviewed the echo images. The aortic valve is thickened, calcified with limited leaflet mobility. I think *** would benefit from AVR. Given advanced age, *** is not a good candidate for conventional AVR by surgical approach. I think *** may be a good candidate for TAVR.   STS Risk Score: ***  I have reviewed the natural history of aortic stenosis with the patient and their family members  who are present today. We have discussed the limitations of medical therapy and the poor prognosis associated with symptomatic aortic stenosis. We have reviewed potential treatment options, including palliative medical therapy, conventional surgical aortic valve replacement, and transcatheter aortic valve replacement. We discussed treatment options in the context of the patient's specific comorbid medical conditions.    *** would like to proceed with planning for TAVR. I will arrange a right and left heart catheterization at [facility] ***. Risks and benefits of the cath procedure and the valve procedure are reviewed with the patient. After the cath, *** will have a cardiac CT, CTA of the chest/abdomen and pelvis, carotid artery dopplers, PT assessment and PFTs and will then be referred to see one of the CT surgeons on our TAVR team.      Current medicines are reviewed at length with the patient today.  The patient {ACTIONS; HAS/DOES NOT HAVE:19233} concerns regarding medicines.  The following changes have been made:  {PLAN; NO CHANGE:13088:s}  Labs/ tests ordered today include: ***  No orders of the defined types were placed in this encounter.    Disposition:   FU with *** in {gen number [id number]} {TIME; UNITS DAY/WEEK/MONTH:19136}   Signed, Lauree Chandler, MD 07/06/2019 9:03 AM    [facility] [address], [place], [place]  [address] Phone: [phone]; Fax: [phone]

## 2019-07-06 NOTE — Progress Notes (Signed)
[place] [facility] [place] NAME: Jacob Simpson    MR#:  [id number]  DATE OF BIRTH:  16-Apr-1930  SUBJECTIVE: Patient admitted for hypotension, acute kidney injury, very hard of hearing but he is saying he is feeling little better than yesterday.  Dehydration is improving, BP better, kidney function also is better.  CHIEF COMPLAINT:   Chief Complaint  Patient presents with  . Weakness    REVIEW OF SYSTEMS:   ROS CONSTITUTIONAL: No fever, fatigue or weakness.  EYES: No blurred or double vision.  EARS, NOSE, AND THROAT: No tinnitus or ear pain.  RESPIRATORY: No cough, shortness of breath, wheezing or hemoptysis.  CARDIOVASCULAR: No chest pain, orthopnea, edema.  GASTROINTESTINAL: No nausea, vomiting, diarrhea or abdominal pain.  GENITOURINARY: No dysuria, hematuria.  ENDOCRINE: No polyuria, nocturia,  HEMATOLOGY: No anemia, easy bruising or bleeding SKIN: No rash or lesion. MUSCULOSKELETAL: No joint pain or arthritis.   NEUROLOGIC: No tingling, numbness, weakness.  PSYCHIATRY: No anxiety or depression.   DRUG ALLERGIES:   Allergies  Allergen Reactions  . Penicillins Rash    Did it involve swelling of the face/tongue/throat, SOB, or low BP? Unknown Did it involve sudden or severe rash/hives, skin peeling, or any reaction on the inside of your mouth or nose? Unknown Did you need to seek medical attention at a hospital or doctor's office? Unknown When did it last happen?Unknown If all above answers are "NO", may proceed with cephalosporin use.     VITALS:  Blood pressure (!) 145/85, pulse 84, temperature 97.6 F (36.4 C), temperature source Oral, resp. rate 19, height 5\' 9"  (1.753 m), weight 86.7 kg, SpO2 100 %.  PHYSICAL EXAMINATION:  GENERAL:  85 y.o.-year-old patient lying in the bed with no acute distress.  EYES: Pupils equal, round, reactive to light and accommodation. No scleral icterus. Extraocular muscles intact.   HEENT: Head atraumatic, normocephalic. Oropharynx and nasopharynx clear.  NECK:  Supple, no jugular venous distention. No thyroid enlargement, no tenderness.  LUNGS: Normal breath sounds bilaterally, no wheezing, rales,rhonchi or crepitation. No use of accessory muscles of respiration.  CARDIOVASCULAR: S1, S2 normal.  severe ejection systolic murmur present in aortic area.  Rubs, or gallops.  ABDOMEN: Soft, nontender, nondistended. Bowel sounds present. No organomegaly or mass.  EXTREMITIES: No pedal edema, cyanosis, or clubbing.  NEUROLOGIC: Cranial nerves II through XII are intact. Muscle strength 5/5 in all extremities. Sensation intact. Gait not checked.  PSYCHIATRIC: The patient is alert and oriented x 3.  SKIN: No obvious rash, lesion, or ulcer.    LABORATORY PANEL:   CBC Recent Labs  Lab 07/06/19 0503  WBC 9.4  HGB 13.2  HCT 40.1  PLT 104*   ------------------------------------------------------------------------------------------------------------------  Chemistries  Recent Labs  Lab 07/06/19 0503  NA 138  K 3.6  CL 109  CO2 21*  GLUCOSE 125*  BUN 32*  CREATININE 1.80*  CALCIUM 7.9*   ------------------------------------------------------------------------------------------------------------------  Cardiac Enzymes No results for input(s): TROPONINI in the last 168 hours. ------------------------------------------------------------------------------------------------------------------  RADIOLOGY:  Dg Chest 2 View  Result Date: 07/05/2019 CLINICAL DATA:  Weakness. EXAM: CHEST - 2 VIEW COMPARISON:  02/28/2019. FINDINGS: Stable mildly enlarged cardiac silhouette and prominent pulmonary vasculature. Mild increase in prominence of the interstitial markings with Kerley lines. No definite pleural fluid. Mild left basilar and minimal right basilar atelectasis. Diffuse osteopenia. Left shoulder degenerative changes. IMPRESSION: 1. Probable mild interstitial pulmonary  edema with stable pulmonary vascular congestion and mild cardiomegaly. 2. Mild left  basilar atelectasis and minimal right basilar atelectasis. Electronically Signed   By: Claudie Revering M.D.   On: 07/05/2019 14:02   Dg Lumbar Spine 2-3 Views  Result Date: 07/05/2019 CLINICAL DATA:  Low back pain and weakness. EXAM: LUMBAR SPINE - 2-3 VIEW COMPARISON:  Abdomen series dated 02/03/2015. FINDINGS: Five non-rib-bearing lumbar vertebrae. Stable screw and plate fusion at the L4 through S1 levels with bone fusion on the right from the S1 level to the mid L2 level and on the left from the S1 level to the L3-4 level. Extensive lumbar and lower thoracic spine degenerative changes are again demonstrated. Approximately 20% T12 compression deformity with no acute fracture lines or bony retropulsion without gross change in the frontal projection. No acute fractures are visualized. Diffuse osteopenia. Atheromatous arterial calcifications. IMPRESSION: 1. No acute abnormality. 2. Stable postsurgical and degenerative changes. Electronically Signed   By: Claudie Revering M.D.   On: 07/05/2019 14:05   US Renal  Result Date: 07/06/2019 CLINICAL DATA:  Acute kidney injury EXAM: RENAL / URINARY TRACT ULTRASOUND COMPLETE COMPARISON:  06/01/2019 FINDINGS: Right Kidney: Renal measurements: 10.2 x 5.5 x 5.6 cm = volume: 162 mL . Echogenicity within normal limits. No mass or hydronephrosis visualized. Left Kidney: Renal measurements: 11.0 x 5.3 x 4.4 cm = volume: 133 mL. Echogenicity within normal limits. No mass or hydronephrosis visualized. Bladder: Appears normal for degree of bladder distention. IMPRESSION: Normal study.  No hydronephrosis. Electronically Signed   By: Rolm Baptise M.D.   On: 07/06/2019 11:39    EKG:   Orders placed or performed during the hospital encounter of 07/05/19  . EKG 12-Lead  . EKG 12-Lead    ASSESSMENT AND PLAN:   #1 hypertension, generalized weakness likely due to dehydration improved with IV  fluids, discontinued IV fluids secondary to severe lactic stenosis.     2.  Severe aortic stenosis, scheduled for first TAVR consultation today, spoke with cardiology to reschedule that appointment.    #3, history of severe attic stenosis, elevated high-sensitivity troponins, patient needs right and left heart cath as an outpatient.  Continue Eliquis. 3.  Acute on CKD stage II, continue gentle hydration, renal function is  better than yesterday.  Creatinine is improved from 2.1 to 1-1.8.  #4. aortic aneurysm size 4.3 cm. #5 history of B-cell lymphoma in remission. 6.  CKD stage III 7.  Proximal atrial fibrillation All the records are reviewed and case discussed with Care Management/Social Workerr. Management plans discussed with the patient, family and they are in agreement.  CODE STATUS:  partial code TOTAL TIME TAKING CARE OF THIS PATIENT: 40 minutes.   POSSIBLE D/C IN 1-2 DAYS, DEPENDING ON CLINICAL CONDITION.   Epifanio Lesches M.D on 07/06/2019 at 2:28 PM  Between 7am to 6pm - Pager - [phone]  After 6pm go to [email or web address] - password [email or web address] [facility]  Office  [phone]  CC: Primary care physician; Valera Castle, MD   Note: This dictation was prepared with Dragon dictation along with smaller phrase technology. Any transcriptional errors that result from this process are unintentional.

## 2019-07-06 NOTE — Progress Notes (Signed)
Spoke with daughter Katharine Look this morning and gave an update. Requested to speak to MD, Dr. Vianne Bulls made aware. Per daughter the current pain medication is not helping and patient has history to constipation with this medication so requesting to be changed. MD also made aware. Upon pain re-assessment patient stated pain medication did help and las bowel movement was 07/05/19. Will continue to monitor patient.

## 2019-07-06 NOTE — Evaluation (Signed)
Physical Therapy Evaluation Patient Details Name: Jacob Simpson MRN: [id number] DOB: [birth year] Today's Date: 07/06/2019   History of Present Illness  presented to ER secondary to generalized weakness; admitted for management of hypotension (thought to be related to severe AS)  Clinical Impression  Upon evaluation, patient alert and oriented; follows commands, eager for mobility attempts.  Bilat UE/LE strength and ROM grossly symmetrical; no focal weakness appreciated.  Does report intermittent pain/discomfort to R thigh (indicates area over quad muscle belly), though not tender to palpation and not aggravated with WBing during session.  Demonstrates ability to complete bed mobility with mod indep; sit/stand, basic transfers and gait (30') with RW, cga/close sup.  Forward flexed posture, min cuing for walker position and management; but fair/good confidence with gait efforts. No buckling, LOB noted.   Mod SOB with exertion, though sats >98% throughout on RA.  BP stable and WFL with transition to upright; no dizziness, lightheadedness reported this date. Would benefit from skilled PT to address above deficits and promote optimal return to PLOF; Recommend transition to [place] upon discharge from acute hospitalization.     Follow Up Recommendations Home health PT    Equipment Recommendations       Recommendations for Other Services       Precautions / Restrictions Precautions Precautions: Fall Restrictions Weight Bearing Restrictions: No      Mobility  Bed Mobility Overal bed mobility: Modified Independent                Transfers Overall transfer level: Needs assistance Equipment used: Rolling walker (2 wheeled) Transfers: Sit to/from Stand Sit to Stand: Min guard         General transfer comment: does require use of hands for lift off, anterior weight translation and overall balance  Ambulation/Gait Ambulation/Gait assistance: Min guard Gait Distance (Feet): 30  Feet Assistive device: Rolling walker (2 wheeled)       General Gait Details: forward flexed posture with RW arms length anterior to patient; fair step height/length, overall balance. Mod SOB with exertion, sats 98% on RA  Stairs            Wheelchair Mobility    Modified Rankin (Stroke Patients Only)       Balance Overall balance assessment: Needs assistance Sitting-balance support: No upper extremity supported;Feet supported Sitting balance-Leahy Scale: Good       Standing balance-Leahy Scale: Fair                               Pertinent Vitals/Pain Pain Assessment: Faces Faces Pain Scale: Hurts a little bit Pain Location: R thigh Pain Descriptors / Indicators: Aching Pain Intervention(s): Limited activity within patient's tolerance;Monitored during session;Premedicated before session;Repositioned    Home Living Family/patient expects to be discharged to:: Private residence Living Arrangements: Alone Available Help at Discharge: Family;Available PRN/intermittently Type of Home: House Home Access: Stairs to enter Entrance Stairs-Rails: Right Entrance Stairs-Number of Steps: 2 Home Layout: One level Home Equipment: Walker - 2 wheels;Cane - single point      Prior Function           Comments: Mod indep with ADLs, household mobilization; family provides frequent check in and often stays with overnight     Hand Dominance        Extremity/Trunk Assessment   Upper Extremity Assessment Upper Extremity Assessment: Overall WFL for tasks assessed    Lower Extremity Assessment Lower Extremity Assessment: Overall WFL for tasks  assessed(grossly 4/5 throughout)       Communication   Communication: HOH  Cognition Arousal/Alertness: Awake/alert Behavior During Therapy: WFL for tasks assessed/performed Overall Cognitive Status: Within Functional Limits for tasks assessed                                        General  Comments      Exercises Other Exercises Other Exercises: Toilet transfer, ambulatory with RW, cga/min assist; min assist from standard height toilet (limited bilat knee flex due to previous TKR) Other Exercises: Upper body dressing, set up/sup in sitting; lower body dressing, min assist to thread undergarments and pants, min assist for sit/stand and stnading balance to pull over hips.   Assessment/Plan    PT Assessment Patient needs continued PT services  PT Problem List Decreased strength;Decreased activity tolerance;Decreased range of motion;Decreased balance;Decreased mobility;Decreased knowledge of use of DME;Decreased safety awareness;Decreased knowledge of precautions;Cardiopulmonary status limiting activity       PT Treatment Interventions DME instruction;Gait training;Stair training;Functional mobility training;Therapeutic activities;Therapeutic exercise;Balance training;Patient/family education    PT Goals (Current goals can be found in the Care Plan section)  Acute Rehab PT Goals Patient Stated Goal: to return home PT Goal Formulation: With patient Time For Goal Achievement: 07/20/19 Potential to Achieve Goals: Good    Frequency Min 2X/week   Barriers to discharge Decreased caregiver support      Co-evaluation               AM-PAC PT "6 Clicks" Mobility  Outcome Measure Help needed turning from your back to your side while in a flat bed without using bedrails?: None Help needed moving from lying on your back to sitting on the side of a flat bed without using bedrails?: None Help needed moving to and from a bed to a chair (including a wheelchair)?: A Little Help needed standing up from a chair using your arms (e.g., wheelchair or bedside chair)?: A Little Help needed to walk in hospital room?: A Little Help needed climbing 3-5 steps with a railing? : A Little 6 Click Score: 20    End of Session Equipment Utilized During Treatment: Gait belt Activity  Tolerance: Patient tolerated treatment well Patient left: in chair;with call bell/phone within reach;with chair alarm set Nurse Communication: Mobility status PT Visit Diagnosis: Muscle weakness (generalized) (M62.81);Difficulty in walking, not elsewhere classified (R26.2)    Time: 5397-6734 PT Time Calculation (min) (ACUTE ONLY): 35 min   Charges:   PT Evaluation $PT Eval Moderate Complexity: 1 Mod PT Treatments $Therapeutic Activity: 23-37 mins        Katriel Cutsforth H. Owens Shark, PT, DPT, NCS 07/06/19, 9:48 PM [phone]

## 2019-07-06 NOTE — Plan of Care (Signed)
  Problem: Education: °Goal: Knowledge of General Education information will improve °Description: Including pain rating scale, medication(s)/side effects and non-pharmacologic comfort measures °Outcome: Progressing °  °Problem: Clinical Measurements: °Goal: Respiratory complications will improve °Outcome: Progressing °  °Problem: Activity: °Goal: Risk for activity intolerance will decrease °Outcome: Progressing °  °Problem: Elimination: °Goal: Will not experience complications related to bowel motility °Outcome: Progressing °  °

## 2019-07-06 NOTE — Telephone Encounter (Signed)
  Daughter is calling because Mr Capell is in the hospital and she would like to speak to Dr Camillia Herter nurse. Please call

## 2019-07-07 ENCOUNTER — Inpatient Hospital Stay: Payer: Medicare Other

## 2019-07-07 ENCOUNTER — Ambulatory Visit: Payer: Self-pay | Admitting: Urology

## 2019-07-07 DIAGNOSIS — I480 Paroxysmal atrial fibrillation: Secondary | ICD-10-CM

## 2019-07-07 LAB — COMPREHENSIVE METABOLIC PANEL
ALT: 28 U/L (ref 0–44)
AST: 36 U/L (ref 15–41)
Albumin: 4.1 g/dL (ref 3.5–5.0)
Alkaline Phosphatase: 62 U/L (ref 38–126)
Anion gap: 9 (ref 5–15)
BUN: 25 mg/dL — ABNORMAL HIGH (ref 8–23)
CO2: 22 mmol/L (ref 22–32)
Calcium: 8.5 mg/dL — ABNORMAL LOW (ref 8.9–10.3)
Chloride: 107 mmol/L (ref 98–111)
Creatinine, Ser: 1.46 mg/dL — ABNORMAL HIGH (ref 0.61–1.24)
GFR calc Af Amer: 49 mL/min — ABNORMAL LOW (ref >60)
GFR calc non Af Amer: 43 mL/min — ABNORMAL LOW (ref >60)
Glucose, Bld: 214 mg/dL — ABNORMAL HIGH (ref 70–99)
Potassium: 4.7 mmol/L (ref 3.5–5.1)
Sodium: 138 mmol/L (ref 135–145)
Total Bilirubin: 0.8 mg/dL (ref 0.3–1.2)
Total Protein: 6.8 g/dL (ref 6.5–8.1)

## 2019-07-07 LAB — SARS CORONAVIRUS 2 BY RT PCR (HOSPITAL ORDER, PERFORMED IN ~~LOC~~ HOSPITAL LAB): SARS Coronavirus 2: NEGATIVE

## 2019-07-07 IMAGING — DX PORTABLE CHEST - 1 VIEW
1 series · 1 of 1 positions shown · non-contrast
Comparison: Radiographs [DATE].

CLINICAL DATA: Cough.

EXAM:
PORTABLE CHEST 1 VIEW

[chest ap]
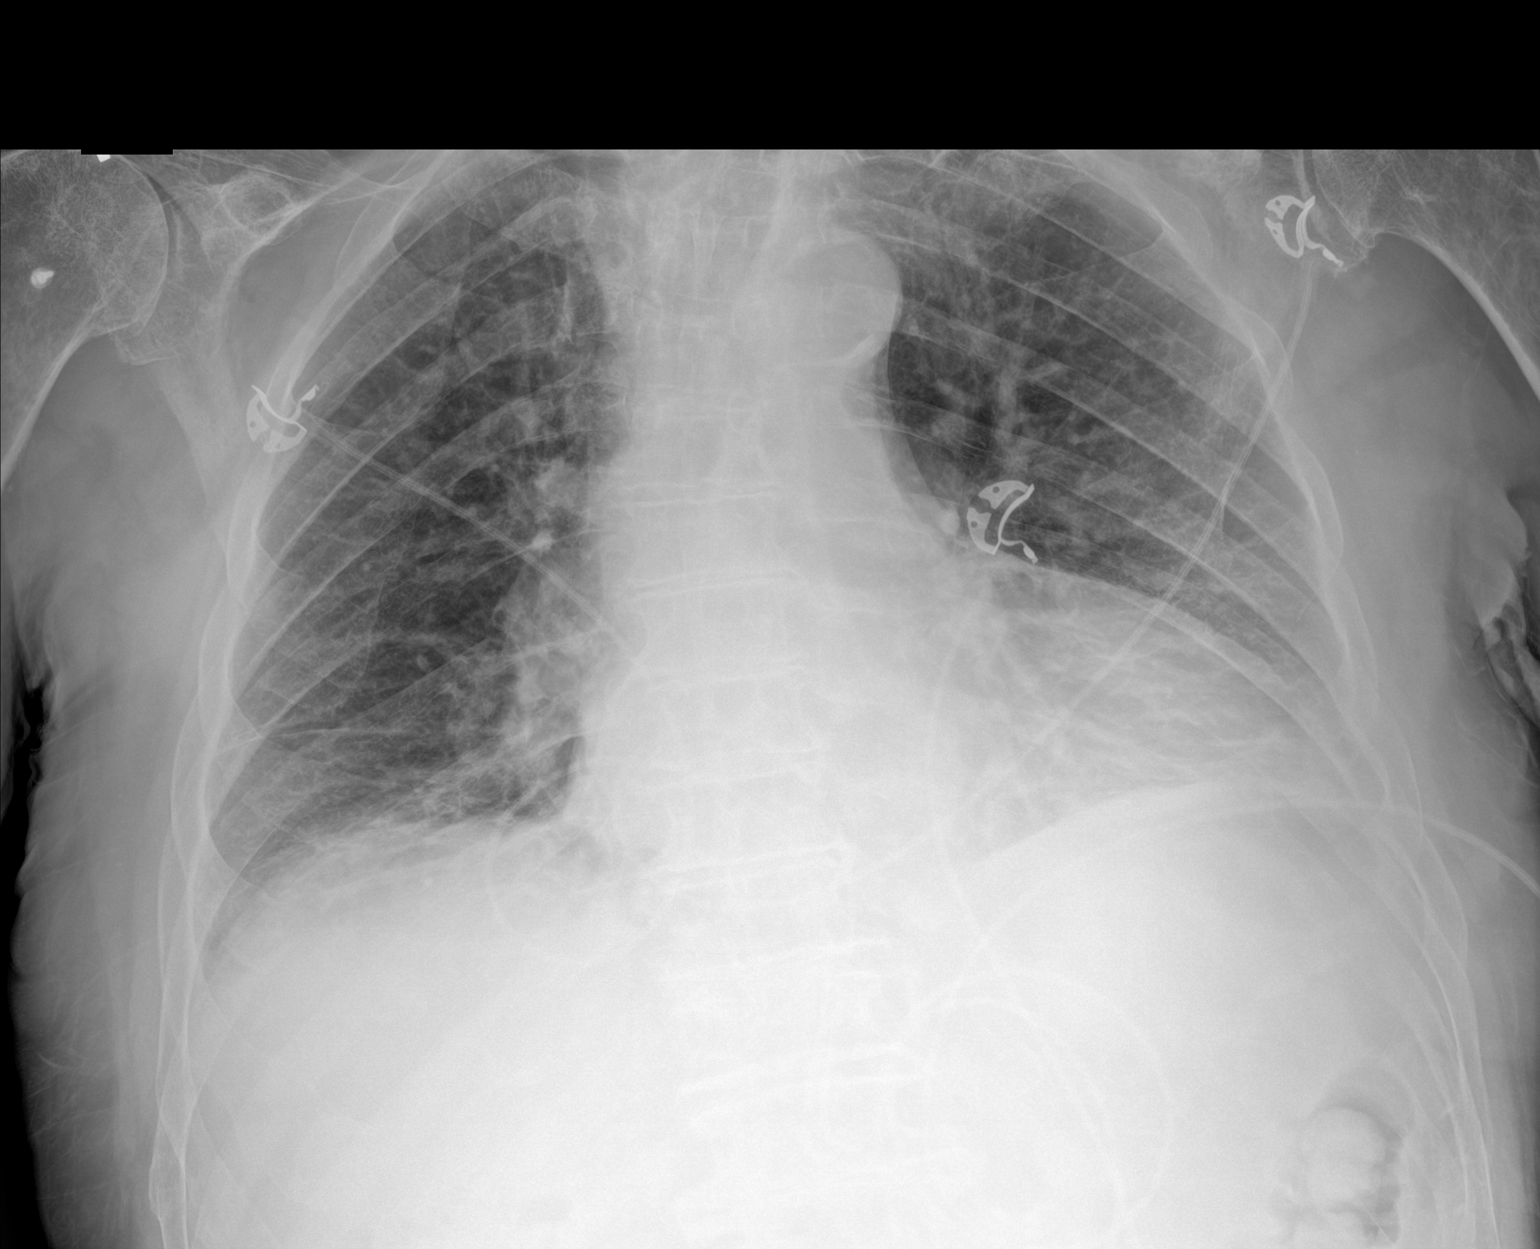

[1 of 1 positions shown; findings below may reference images not displayed]

FINDINGS: Stable cardiomegaly. Atherosclerosis of thoracic aorta is noted. No
pneumothorax or pleural effusion is noted. Mild bibasilar
subsegmental atelectasis is noted. Bony thorax is unremarkable.
IMPRESSION: Mild bibasilar subsegmental atelectasis.

Aortic Atherosclerosis ([0T]-[0T]).

## 2019-07-07 MED ORDER — AZITHROMYCIN 250 MG PO TABS
500.0000 mg | ORAL_TABLET | Freq: Every day | ORAL | Status: AC
  Administered 2019-07-07: 500 mg via ORAL
  Filled 2019-07-07: qty 2

## 2019-07-07 MED ORDER — IPRATROPIUM-ALBUTEROL 20-100 MCG/ACT IN AERS: 1.0000 | INHALATION_SPRAY | Freq: Four times a day (QID) | RESPIRATORY_TRACT | 0 refills | Status: DC

## 2019-07-07 MED ORDER — OXYCODONE HCL 5 MG PO TABS: 5.0000 mg | ORAL_TABLET | ORAL | 0 refills | Status: DC | PRN

## 2019-07-07 MED ORDER — GUAIFENESIN-DM 100-10 MG/5ML PO SYRP
15.0000 mL | ORAL_SOLUTION | Freq: Four times a day (QID) | ORAL | Status: DC | PRN
  Administered 2019-07-07: 15 mL via ORAL
  Filled 2019-07-07: qty 15

## 2019-07-07 MED ORDER — GABAPENTIN 100 MG PO CAPS: 100.0000 mg | ORAL_CAPSULE | Freq: Three times a day (TID) | ORAL | 0 refills | Status: DC | PRN

## 2019-07-07 MED ORDER — AZITHROMYCIN 250 MG PO TABS
250.0000 mg | ORAL_TABLET | Freq: Every day | ORAL | Status: DC
  Administered 2019-07-08: 250 mg via ORAL
  Filled 2019-07-07: qty 1

## 2019-07-07 NOTE — Progress Notes (Signed)
Progress Note  Patient Name: Jacob Simpson Date of Encounter: 07/07/2019  Primary Cardiologist: Fletcher Anon  Subjective   No chest pain, shortness of breath, palpitations, dizziness, presyncope, or syncope.  He is in significant low back pain with radiculopathy primarily affecting the right leg this morning and he attributes this to having to lay on MRI table on 7/20 to further evaluate his back pain.  Inpatient Medications    Scheduled Meds: . apixaban  2.5 mg Oral BID  . atorvastatin  40 mg Oral Daily  . Ipratropium-Albuterol  1 puff Inhalation Q6H  . loratadine  10 mg Oral Daily  . metoprolol succinate  50 mg Oral Daily  . mirtazapine  7.5 mg Oral QHS  . pantoprazole  40 mg Oral Daily  . sodium chloride flush  10 mL Intravenous Q12H   Continuous Infusions:  PRN Meds: acetaminophen **OR** acetaminophen, azelastine, famotidine, fluticasone, gabapentin, ondansetron **OR** ondansetron (ZOFRAN) IV, oxyCODONE, polyethylene glycol   Vital Signs    Vitals:   07/06/19 1520 07/06/19 1939 07/06/19 2135 07/07/19 0328  BP: 115/69 135/75  96/83  Pulse: 72 81  95  Resp: 18 18  18   Temp: 97.8 F (36.6 C) (!) 97.4 F (36.3 C)  98.1 F (36.7 C)  TempSrc: Oral Oral    SpO2: 98% 99% 98% 97%  Weight:      Height:        Intake/Output Summary (Last 24 hours) at 07/07/2019 5784 Last data filed at 07/06/2019 2203 Gross per 24 hour  Intake -  Output 0 ml  Net 0 ml   Filed Weights   07/05/19 1325 07/05/19 1812  Weight: 81.6 kg 86.7 kg    Telemetry    SR with PACs- Personally Reviewed  ECG    n/a - Personally Reviewed  Physical Exam   GEN:  Elderly appearing, no acute distress.   Neck: No JVD. Cardiac: RRR, III/VI systolic murmur RUSB, no rubs, or gallops.  Respiratory: Clear to auscultation bilaterally.  GI: Soft, nontender, non-distended.   MS: No edema; No deformity. Neuro:  Alert and oriented x 3; Nonfocal.  Psych: Normal affect.  Labs    Chemistry Recent Labs   Lab 07/05/19 1330 07/06/19 0503  NA 139 138  K 3.9 3.6  CL 110 109  CO2 20* 21*  GLUCOSE 188* 125*  BUN 36* 32*  CREATININE 2.21* 1.80*  CALCIUM 7.8* 7.9*  GFRNONAA 26* 33*  GFRAA 30* 38*  ANIONGAP 9 8     Hematology Recent Labs  Lab 07/05/19 1330 07/06/19 0503  WBC 7.5 9.4  RBC 3.69* 4.13*  HGB 11.6* 13.2  HCT 35.0* 40.1  MCV 94.9 97.1  MCH 31.4 32.0  MCHC 33.1 32.9  RDW 12.4 12.6  PLT 95* 104*    Cardiac EnzymesNo results for input(s): TROPONINI in the last 168 hours. No results for input(s): TROPIPOC in the last 168 hours.   BNPNo results for input(s): BNP, PROBNP in the last 168 hours.   DDimer No results for input(s): DDIMER in the last 168 hours.   Radiology    Dg Chest 2 View  Result Date: 07/05/2019 IMPRESSION: 1. Probable mild interstitial pulmonary edema with stable pulmonary vascular congestion and mild cardiomegaly. 2. Mild left basilar atelectasis and minimal right basilar atelectasis. Electronically Signed   By: Claudie Revering M.D.   On: 07/05/2019 14:02   Dg Lumbar Spine 2-3 Views  Result Date: 07/05/2019 IMPRESSION: 1. No acute abnormality. 2. Stable postsurgical and degenerative changes.  Electronically Signed   By: Claudie Revering M.D.   On: 07/05/2019 14:05   Mr Thoracic Spine Wo Contrast  Result Date: 07/06/2019 IMPRESSION: Old inferior endplate fracture at V76 which is healed without edema. Thoracic curvature convex to the right. Ordinary age related degenerative disc disease throughout the thoracic region but without compressive narrowing of the canal or foramina. Ordinary facet osteoarthritis. Findings could certainly relate to generalized back with Electronically Signed   By: Nelson Chimes M.D.   On: 07/06/2019 17:49   Mr Lumbar Spine Wo Contrast  Result Date: 07/06/2019 IMPRESSION: Old healed inferior endplate fracture at H60. No acute lumbar region fracture. Solid union in the fusion segment from L4 to the sacrum. Limited detail in that  region because of artifact from fusion hardware. This also makes evaluation of L3-4 markedly suboptimal. Degenerative disc disease and facet disease at L1-2 and L2-3 with some degree of stenosis of the canal and lateral recesses. Detail is poor because of motion. Some potential for neural compression at those levels. Electronically Signed   By: Nelson Chimes M.D.   On: 07/06/2019 17:53   US Renal  Result Date: 07/06/2019 IMPRESSION: Normal study.  No hydronephrosis. Electronically Signed   By: Rolm Baptise M.D.   On: 07/06/2019 11:39    Cardiac Studies   2D echo 07/06/2019: 1. The left ventricle has normal systolic function, with an ejection fraction of 55-60%. The cavity size was normal. There is moderately increased left ventricular wall thickness. Left ventricular diastolic function could not be evaluated.  2. The right ventricle has normal systolic function. The cavity was mildly enlarged. There is no increase in right ventricular wall thickness. Right ventricular systolic pressure is severely elevated with an estimated pressure of 71.9 mmHg.  3. Left atrial size was mildly dilated.  4. Right atrial size was moderately dilated.  5. The mitral valve is degenerative. Mild thickening of the mitral valve leaflet. Moderate calcification of the mitral valve leaflet. Mitral valve regurgitation is moderate by color flow Doppler.  6. The tricuspid valve is not well visualized. Tricuspid valve regurgitation is mild-moderate.  7. The aortic valve was not well visualized. Moderate thickening of the aortic valve. Aortic valve regurgitation is mild by color flow Doppler. Severe stenosis of the aortic valve.  8. The aortic root is normal in size and structure. __________  2D echo 02/2019: 1. The left ventricle has normal systolic function with an ejection fraction of 60-65%. The cavity size was normal. There is mildly increased left ventricular wall thickness. Left ventricular diastolic Doppler parameters are  consistent with impaired relaxation.  2. The right ventricle has normal systolic function. The cavity was normal. There is no increase in right ventricular wall thickness. Right ventricular systolic pressure is moderately elevated with an estimated pressure of 46.0 mmHg.  3. Left pleural effusion noted  4. Mitral valve regurgitation is moderate by color flow Doppler.  5. Left atrial size was severely dilated.  6. Severe calcifcation of the aortic valve. severe stenosis of the aortic valve. Mean gradient 42 mm Hg, peak velocity 409 cm/sec  7. The tricuspid valve is grossly normal.  Patient Profile     85 y.o. male with history of severe aortic stenosis, HFpEF, PAF, B-cell lymphoma in remission, CKD stage II, TIA, aortic aneurysm measuring 4.3 cm, and prior tobacco abuse who is being seen today for the evaluation of weakness/hypotension  Assessment & Plan    1. Hypotension/weakness: -Patient states he is at his approximate baseline -Likely  secondary to dehydration exacerbated by severe aortic stenosis -Improved following gentle IV fluids -Ambulate with PT -Improved   2. Severe aortic stenosis: -Recent echo from 02/2019 as above with repeat echo this admission continuing to show normal LVEF with severe aortic stenosis -He was initially scheduled for his first TAVR consultation on 07/06/2019, which will need to be rescheduled -Would be cautious not to over hydrate given his severe aortic stenosis  3. Elevated hs-Tn: -Minimally elevated with a delta of 2 (51-->53) consistent of ~ 0.05-0.06 by the old test -He will need a R/LHC as an outpatient once he has been evaluated by the TAVR clinic and committed to [facility] [facility] in place of [facility]  4. Acute on CKD stage II: -Improving with IV fluids upon admission -No labs today -Baseline SCr ~1.3-1.4 with an admission SCr of 2.21, currently improved to 1.8 as of 7/20  5. PAF: -Maintaining sinus rhythm with frequent PACs -Continue Eliquis  2.5 mg bid given SCr > 1.5 and age > 50 -Metoprolol at lower dose 50 mg daily  6. Anemia: -Stable upon admission  7. HLD: -Lipitor -LDL of 53 from 04/2018  8. Memory loss: -MRI brain unrevealing this month -Per neurology   9.  Low back/lower extremity pain/lower extremity weakness: -This is the initial reason for the patient presenting to the ED per his report -MRI on 07/06/2019 as above -Per internal medicine  For questions or updates, please contact [place] Please consult [email or web address] for contact info under Cardiology/STEMI.    Signed, Christell Faith, PA-C [place] Pager: [phone] 07/07/2019, 8:12 AM

## 2019-07-07 NOTE — Progress Notes (Signed)
[facility] [place] NAME: Yusuf Yu    MR#:  [id number]  DATE OF BIRTH:  [birth year]  SUBJECTIVE: Patient admitted for dehydration, hypotension, acute kidney injury, all that is resolved, patient does have some cough, back pain.  Patient COVID-19 test is negative.  MRI of thoracic and lumbar spine showed degeneration.  Spoke with patient's daughter about the results, daughter wants him home tomorrow with home health.  CHIEF COMPLAINT:   Chief Complaint  Patient presents with  . Weakness    REVIEW OF SYSTEMS:   Review of Systems  Constitutional: Negative for chills and fever.  HENT: Negative for hearing loss.   Eyes: Negative for blurred vision, double vision and photophobia.  Respiratory: Positive for cough. Negative for hemoptysis and shortness of breath.   Cardiovascular: Negative for chest pain, palpitations, orthopnea and leg swelling.  Gastrointestinal: Negative for abdominal pain, diarrhea and vomiting.  Genitourinary: Negative for dysuria and urgency.  Musculoskeletal: Positive for back pain. Negative for myalgias and neck pain.  Skin: Negative for rash.  Neurological: Negative for dizziness, focal weakness, seizures, weakness and headaches.  Psychiatric/Behavioral: Negative for memory loss. The patient does not have insomnia.     DRUG ALLERGIES:   Allergies  Allergen Reactions  . Penicillins Rash    Did it involve swelling of the face/tongue/throat, SOB, or low BP? Unknown Did it involve sudden or severe rash/hives, skin peeling, or any reaction on the inside of your mouth or nose? Unknown Did you need to seek medical attention at a hospital or doctor's office? Unknown When did it last happen?Unknown If all above answers are "NO", may proceed with cephalosporin use.     VITALS:  Blood pressure 134/89, pulse 81, temperature 98.4 F (36.9 C), resp. rate 19, height 5\' 9"  (1.753 m), weight 86.7 kg, SpO2 98  %.  PHYSICAL EXAMINATION:  GENERAL:  85 y.o.-year-old patient lying in the bed with no acute distress.  Very hard of hearing.  EYES: Pupils equal, round, reactive to light and accommodation. No scleral icterus. Extraocular muscles intact.  HEENT: Head atraumatic, normocephalic. Oropharynx and nasopharynx clear.  NECK:  Supple, no jugular venous distention. No thyroid enlargement, no tenderness.  LUNGS: Normal breath sounds bilaterally, no wheezing, rales,rhonchi or crepitation. No use of accessory muscles of respiration.  CARDIOVASCULAR: S1, S2 normal.  severe ejection systolic murmur present in aortic area.  Rubs, or gallops.  ABDOMEN: Soft, nontender, nondistended. Bowel sounds present. No organomegaly or mass.  EXTREMITIES: No pedal edema, cyanosis, or clubbing.  NEUROLOGIC: Cranial nerves II through XII are intact. Muscle strength 5/5 in all extremities. Sensation intact. Gait not checked.  PSYCHIATRIC: The patient is alert and oriented x 3.  SKIN: No obvious rash, lesion, or ulcer.    LABORATORY PANEL:   CBC Recent Labs  Lab 07/06/19 0503  WBC 9.4  HGB 13.2  HCT 40.1  PLT 104*   ------------------------------------------------------------------------------------------------------------------  Chemistries  Recent Labs  Lab 07/07/19 1330  NA 138  K 4.7  CL 107  CO2 22  GLUCOSE 214*  BUN 25*  CREATININE 1.46*  CALCIUM 8.5*  AST 36  ALT 28  ALKPHOS 62  BILITOT 0.8   ------------------------------------------------------------------------------------------------------------------  Cardiac Enzymes No results for input(s): TROPONINI in the last 168 hours. ------------------------------------------------------------------------------------------------------------------  RADIOLOGY:  Mr Thoracic Spine Wo Contrast  Result Date: 07/06/2019 CLINICAL DATA:  New onset thoracic back pain. EXAM: MRI THORACIC SPINE WITHOUT CONTRAST TECHNIQUE: Multiplanar, multisequence MR  imaging of the  thoracic spine was performed. No intravenous contrast was administered. COMPARISON:  Chest radiography 07/05/2019 FINDINGS: Alignment:  Thoracic curvature convex to the right. Vertebrae: Old inferior endplate compression fracture at T12 which is healed without edema. No other regional fracture or focal bone finding. Cord:  No cord compression or primary cord lesion. Paraspinal and other soft tissues: Negative Disc levels: Ordinary thoracic region degenerative disc disease with loss of disc height and mild disc bulges. No disc herniation or compressive stenosis of the canal or foramina. Ordinary facet osteoarthritis. IMPRESSION: Old inferior endplate fracture at W96 which is healed without edema. Thoracic curvature convex to the right. Ordinary age related degenerative disc disease throughout the thoracic region but without compressive narrowing of the canal or foramina. Ordinary facet osteoarthritis. Findings could certainly relate to generalized back with Electronically Signed   By: Nelson Chimes M.D.   On: 07/06/2019 17:49   Mr Lumbar Spine Wo Contrast  Result Date: 07/06/2019 CLINICAL DATA:  New onset leg and back pain. Previous lumbar fusion. EXAM: MRI LUMBAR SPINE WITHOUT CONTRAST TECHNIQUE: Multiplanar, multisequence MR imaging of the lumbar spine was performed. No intravenous contrast was administered. COMPARISON:  Radiography 07/05/2019 FINDINGS: Segmentation:  5 lumbar type vertebral bodies. Alignment:  Mild curvature convex to the left. Vertebrae:  Previous discectomy and fusion procedure L4 to sacrum. Conus medullaris and cauda equina: Conus extends to the L1 level. Conus and cauda equina appear normal. Paraspinal and other soft tissues: Negative Disc levels: T12-L1: Disc bulge. Narrowing of the subarachnoid space but no neural compression. Old healed inferior endplate deformity at E45. L1-2: Disc degeneration with loss of disc height. Endplate osteophytes and bulging of the disc. Facet  osteoarthritis. Canal narrowing with some potential for neural compression in the lateral recesses and foramina. L2-3: Bulging of the disc. Facet hypertrophy. Moderate multifactorial stenosis that could cause neural compression. L3-4: Disc degeneration with disc space narrowing. Limited information based on pronounced artifact from fusion hardware. L4 to sacrum: Previous lumbar fusion surgery. Fusion is solid. Limited information based on fusion hardware artifact. IMPRESSION: Old healed inferior endplate fracture at W09. No acute lumbar region fracture. Solid union in the fusion segment from L4 to the sacrum. Limited detail in that region because of artifact from fusion hardware. This also makes evaluation of L3-4 markedly suboptimal. Degenerative disc disease and facet disease at L1-2 and L2-3 with some degree of stenosis of the canal and lateral recesses. Detail is poor because of motion. Some potential for neural compression at those levels. Electronically Signed   By: Nelson Chimes M.D.   On: 07/06/2019 17:53   US Renal  Result Date: 07/06/2019 CLINICAL DATA:  Acute kidney injury EXAM: RENAL / URINARY TRACT ULTRASOUND COMPLETE COMPARISON:  06/01/2019 FINDINGS: Right Kidney: Renal measurements: 10.2 x 5.5 x 5.6 cm = volume: 162 mL . Echogenicity within normal limits. No mass or hydronephrosis visualized. Left Kidney: Renal measurements: 11.0 x 5.3 x 4.4 cm = volume: 133 mL. Echogenicity within normal limits. No mass or hydronephrosis visualized. Bladder: Appears normal for degree of bladder distention. IMPRESSION: Normal study.  No hydronephrosis. Electronically Signed   By: Rolm Baptise M.D.   On: 07/06/2019 11:39   Dg Chest Port 1 View  Result Date: 07/07/2019 CLINICAL DATA:  Cough. EXAM: PORTABLE CHEST 1 VIEW COMPARISON:  Radiographs of July 05, 2019. FINDINGS: Stable cardiomegaly. Atherosclerosis of thoracic aorta is noted. No pneumothorax or pleural effusion is noted. Mild bibasilar subsegmental  atelectasis is noted. Bony thorax is unremarkable. IMPRESSION: Mild bibasilar  subsegmental atelectasis. Aortic Atherosclerosis (ICD10-I70.0). Electronically Signed   By: Marijo Conception M.D.   On: 07/07/2019 15:27    EKG:   Orders placed or performed during the hospital encounter of 07/05/19  . EKG 12-Lead  . EKG 12-Lead    ASSESSMENT AND PLAN:   #1  Acute kidney injury with ATN due to hypotension: Improved received gentle hydration.  2.  Severe aortic stenosis, scheduled for first TAVR consultation today, spoke with cardiology to reschedule that appointment.    #3,  Proximal atrial fibrillation, seen by cardiology, patient is on Eliquis. 4..  Acute on CKD stage II, continue gentle hydration, renal function is  better than yesterday.  Creatinine is improved from 2.1 to 1-1.8.  #4. aortic aneurysm size 4.3 cm. #5 history of B-cell lymphoma in remission. 6.  Chronic renal failure, CKD stage III improved. 7.  Proximal atrial fibrillation  #8 cough,; and elderly male, rapid COVID-19 test is negative, chest x-ray showed mild atelectasis, will order incentive spirometry, add Z-Pak. 9.  Low back pain, MRI of her thoracic and lumbar spine showed degenerative disease, history of back surgeries before, discussed MRI is also with patient's daughter.  Called patient's daughter Ms. [name] and updated about his COVID-19 results, cough, discharge home tomorrow with home health. All the records are reviewed and case discussed with Care Management/Social Workerr. Management plans discussed with the patient, family and they are in agreement.  CODE STATUS:  partial code TOTAL TIME TAKING CARE OF THIS PATIENT: 40 minutes.   POSSIBLE D/C IN 1-2 DAYS, DEPENDING ON CLINICAL CONDITION.   Epifanio Lesches M.D on 07/07/2019 at 3:47 PM  Between 7am to 6pm - Pager - [phone]  After 6pm go to [email or web address] - password [email or web address] [facility]  Office  [phone]  CC: Primary care  physician; Valera Castle, MD   Note: This dictation was prepared with Dragon dictation along with smaller phrase technology. Any transcriptional errors that result from this process are unintentional.

## 2019-07-07 NOTE — Plan of Care (Signed)

## 2019-07-07 NOTE — TOC Progression Note (Signed)
Transition of Care [facility]) - Progression Note    Patient Details  Name: AMINE ADELSON MRN: [id number] Date of Birth: 02-05-1930  [facility] CM/SW Contact  Katrina Stack, RN Phone Number: 07/07/2019, 7:41 PM  Clinical Narrative:   Patient is agreeable to home health and agency preference is [facility].  Referral accepted for RN and PT    Expected Discharge Plan: Home/Self Care Barriers to Discharge: Continued Medical Work up  Expected Discharge Plan and Services Expected Discharge Plan: Home/Self Care   Discharge Planning Services: CM Consult   Living arrangements for the past 2 months: Single Family Home                                       Social Determinants of Health (SDOH) Interventions    Readmission Risk Interventions Readmission Risk Prevention Plan 07/06/2019  Transportation Screening Complete  PCP or Specialist Appt within 5-7 Days Complete  Home Care Screening Complete  Medication Review (RN CM) Complete  Some recent data might be hidden

## 2019-07-07 NOTE — Progress Notes (Signed)
Incentive spirometry at bedside. Teach back with patient on how to use it.

## 2019-07-07 NOTE — Progress Notes (Signed)
Spoke with daughter Nicola Police today. Answered all questions appropriately and she verbalized cardio and Dr. Vianne Bulls have called her as well.

## 2019-07-08 LAB — NOVEL CORONAVIRUS, NAA (HOSP ORDER, SEND-OUT TO REF LAB; TAT 18-24 HRS): SARS-CoV-2, NAA: NOT DETECTED

## 2019-07-08 MED ORDER — AZITHROMYCIN 250 MG PO TABS: ORAL_TABLET | ORAL | 0 refills | Status: DC

## 2019-07-08 MED ORDER — FUROSEMIDE 20 MG PO TABS: 20.0000 mg | ORAL_TABLET | Freq: Every day | ORAL | 0 refills | Status: DC

## 2019-07-08 MED ORDER — POLYETHYLENE GLYCOL 3350 17 G PO PACK: 17.0000 g | PACK | Freq: Every day | ORAL | 0 refills | Status: DC | PRN

## 2019-07-08 MED ORDER — TAMSULOSIN HCL 0.4 MG PO CAPS: 0.4000 mg | ORAL_CAPSULE | Freq: Every day | ORAL | 0 refills | Status: DC

## 2019-07-08 MED ORDER — OXYCODONE HCL 5 MG PO TABS: 5.0000 mg | ORAL_TABLET | Freq: Four times a day (QID) | ORAL | 0 refills | Status: DC | PRN

## 2019-07-08 MED ORDER — METOPROLOL SUCCINATE ER 50 MG PO TB24: 50.0000 mg | ORAL_TABLET | Freq: Every day | ORAL | 0 refills | Status: DC

## 2019-07-08 NOTE — Progress Notes (Signed)
RN updated pt's daughter and reviewed discharge instructions and follow-up appt information with her. All questions about discharge are answered and daughter verbalizes understanding. I will continue to assess.

## 2019-07-08 NOTE — Discharge Summary (Signed)
Jacob Simpson NAME: Jacob Simpson    MR#:  [id number]  DATE OF BIRTH:  Oct 25, 1930  DATE OF ADMISSION:  07/05/2019 ADMITTING PHYSICIAN: Sela Hua, MD  DATE OF DISCHARGE: 07/08/2019  2:24 PM  PRIMARY CARE PHYSICIAN: Valera Castle, MD    ADMISSION DIAGNOSIS:  Weakness [R53.1] Acute lumbar back pain [M54.5] Acute kidney injury (nontraumatic) (HCC) [N17.9] Hypotension, unspecified hypotension type [I95.9]  DISCHARGE DIAGNOSIS:  Active Problems:   Hypotension   SECONDARY DIAGNOSIS:   Past Medical History:  Diagnosis Date  . A-fib ([place])   . Aortic aneurysm ([place])   . Aortic stenosis   . Asthma   . CHF (congestive heart failure) ([place])   . Hammertoe   . Heart murmur   . HTN (hypertension)   . TIA (transient ischemic attack)     HOSPITAL COURSE:   1.  Acute kidney injury secondary to ATN and hypotension.  The patient received gentle IV fluid hydration during the hospital course creatinine went from 2.21 down to 1.46.  Patient has chronic kidney disease stage III. 2.  Severe aortic stenosis.  Patient will follow-up as outpatient for TAVR consultation. 3.  Paroxysmal atrial fibrillation.  Patient on low-dose Eliquis.  Metoprolol continued. 4.  Low back pain and leg pain.  MRI showing an old healed fracture at T12 prior fusion at L4 spinal stenosis L1-L2 and L2 and L3.  Movement limited this exam. 5.  BPH.  Flomax started with patient's difficulty urinating 6.  Chronic diastolic congestive heart failure with severe aortic stenosis.  Patient's daughter wanted to go back on the Lasix I will start at half dose 20 mg daily. 7.  Home health physical therapy set up 8.  Chronic thrombocytopenia  DISCHARGE CONDITIONS:   Satisfactory  CONSULTS OBTAINED:  Treatment Team:  Nelva Bush, MD  DRUG ALLERGIES:   Allergies  Allergen Reactions  . Penicillins Rash    Did it involve swelling of the face/tongue/throat, SOB,  or low BP? Unknown Did it involve sudden or severe rash/hives, skin peeling, or any reaction on the inside of your mouth or nose? Unknown Did you need to seek medical attention at a hospital or doctor's office? Unknown When did it last happen?Unknown If all above answers are "NO", may proceed with cephalosporin use.     DISCHARGE MEDICATIONS:   Allergies as of 07/08/2019      Reactions   Penicillins Rash   Did it involve swelling of the face/tongue/throat, SOB, or low BP? Unknown Did it involve sudden or severe rash/hives, skin peeling, or any reaction on the inside of your mouth or nose? Unknown Did you need to seek medical attention at a hospital or doctor's office? Unknown When did it last happen?Unknown If all above answers are "NO", may proceed with cephalosporin use.      Medication List    TAKE these medications   acetaminophen 500 MG tablet Commonly known as: TYLENOL Take 1,000 mg by mouth every 6 (six) hours as needed for mild pain, moderate pain or fever.   apixaban 2.5 MG Tabs tablet Commonly known as: Eliquis Take 1 tablet (2.5 mg total) by mouth 2 (two) times daily.   atorvastatin 40 MG tablet Commonly known as: LIPITOR Take 40 mg by mouth daily.   azelastine 0.1 % nasal spray Commonly known as: ASTELIN Place 1 spray into both nostrils 2 (two) times daily as needed for rhinitis or allergies. Use in each nostril as directed  azithromycin 250 MG tablet Commonly known as: ZITHROMAX One tab daily for three days Start taking on: July 09, 2019   cetirizine 10 MG tablet Commonly known as: ZYRTEC Take 10 mg by mouth daily as needed for allergies.   famotidine 10 MG tablet Commonly known as: PEPCID Take 10 mg by mouth daily as needed for heartburn or indigestion.   fluticasone 110 MCG/ACT inhaler Commonly known as: FLOVENT HFA Inhale 1 puff into the lungs 2 (two) times daily as needed (respiratory symptoms).   furosemide 20 MG tablet Commonly  known as: Lasix Take 1 tablet (20 mg total) by mouth daily. What changed:   medication strength  how much to take   gabapentin 100 MG capsule Commonly known as: NEURONTIN Take 1 capsule (100 mg total) by mouth 3 (three) times daily as needed (back pain).   guaiFENesin 600 MG 12 hr tablet Commonly known as: MUCINEX Take 600 mg by mouth 2 (two) times daily as needed for cough or to loosen phlegm.   Ipratropium-Albuterol 20-100 MCG/ACT Aers respimat Commonly known as: COMBIVENT Inhale 1 puff into the lungs every 6 (six) hours.   meclizine 12.5 MG tablet Commonly known as: ANTIVERT Take 12.5 mg by mouth 3 (three) times daily as needed for dizziness.   metoprolol succinate 50 MG 24 hr tablet Commonly known as: TOPROL-XL Take 1 tablet (50 mg total) by mouth daily. Take with or immediately following a meal. What changed: how much to take   mirtazapine 7.5 MG tablet Commonly known as: REMERON Take 7.5 mg by mouth at bedtime.   omeprazole 20 MG capsule Commonly known as: PRILOSEC Take 20 mg by mouth daily as needed (stomach acid).   ondansetron 8 MG disintegrating tablet Commonly known as: ZOFRAN-ODT Take 8 mg by mouth every 8 (eight) hours as needed for nausea or vomiting.   oxyCODONE 5 MG immediate release tablet Commonly known as: Oxy IR/ROXICODONE Take 1 tablet (5 mg total) by mouth every 6 (six) hours as needed for moderate pain or severe pain.   polyethylene glycol 17 g packet Commonly known as: MiraLax Take 17 g by mouth daily as needed for moderate constipation.   tamsulosin 0.4 MG Caps capsule Commonly known as: Flomax Take 1 capsule (0.4 mg total) by mouth daily after supper.        DISCHARGE INSTRUCTIONS:   Follow-up PMD 5 days Follow-up with cardiology at [facility] for TAVR consultation  If you experience worsening of your admission symptoms, develop shortness of breath, life threatening emergency, suicidal or homicidal thoughts you must seek medical  attention immediately by calling 911 or calling your MD immediately  if symptoms less severe.  You Must read complete instructions/literature along with all the possible adverse reactions/side effects for all the Medicines you take and that have been prescribed to you. Take any new Medicines after you have completely understood and accept all the possible adverse reactions/side effects.   Please note  You were cared for by a hospitalist during your hospital stay. If you have any questions about your discharge medications or the care you received while you were in the hospital after you are discharged, you can call the unit and asked to speak with the hospitalist on call if the hospitalist that took care of you is not available. Once you are discharged, your primary care physician will handle any further medical issues. Please note that NO REFILLS for any discharge medications will be authorized once you are discharged, as it is imperative that  you return to your primary care physician (or establish a relationship with a primary care physician if you do not have one) for your aftercare needs so that they can reassess your need for medications and monitor your lab values.    Today   CHIEF COMPLAINT:   Chief Complaint  Patient presents with  . Weakness    HISTORY OF PRESENT ILLNESS:  Jacob Simpson  is a 85 y.o. male came in with weakness found to have acute kidney injury   VITAL SIGNS:  Blood pressure (!) 143/82, pulse 84, temperature 98.2 F (36.8 C), resp. rate 20, height 5\' 9"  (1.753 m), weight 86.2 kg, SpO2 98 %.    PHYSICAL EXAMINATION:  GENERAL:  85 y.o.-year-old patient lying in the bed with no acute distress.  EYES: Pupils equal, round, reactive to light and accommodation. No scleral icterus. Extraocular muscles intact.  HEENT: Head atraumatic, normocephalic. Oropharynx and nasopharynx clear.  NECK:  Supple, no jugular venous distention. No thyroid enlargement, no tenderness.   LUNGS: Normal breath sounds bilaterally, no wheezing, rales,rhonchi or crepitation. No use of accessory muscles of respiration.  CARDIOVASCULAR: S1, S2 normal. No murmurs, rubs, or gallops.  ABDOMEN: Soft, non-tender, non-distended. Bowel sounds present. No organomegaly or mass.  EXTREMITIES: Trace pedal edema, no cyanosis, or clubbing.  NEUROLOGIC: Cranial nerves II through XII are intact. Muscle strength 5/5 in all extremities. Sensation intact. Gait not checked.  PSYCHIATRIC: The patient is alert and oriented x 3.  SKIN: No obvious rash, lesion, or ulcer.   DATA REVIEW:   CBC Recent Labs  Lab 07/06/19 0503  WBC 9.4  HGB 13.2  HCT 40.1  PLT 104*    Chemistries  Recent Labs  Lab 07/07/19 1330  NA 138  K 4.7  CL 107  CO2 22  GLUCOSE 214*  BUN 25*  CREATININE 1.46*  CALCIUM 8.5*  AST 36  ALT 28  ALKPHOS 62  BILITOT 0.8    Cardiac Enzymes No results for input(s): TROPONINI in the last 168 hours.  Microbiology Results  Results for orders placed or performed during the hospital encounter of 07/05/19  Novel Coronavirus,NAA,(SEND-OUT TO REF LAB - TAT 24-48 hrs); Hosp Order     Status: None   Collection Time: 07/05/19  1:30 PM   Specimen: Nasopharyngeal Swab; Respiratory  Result Value Ref Range Status   SARS-CoV-2, NAA NOT DETECTED NOT DETECTED Final    Comment: (NOTE) This test was developed and its performance characteristics determined by [employer]. This test has not been FDA cleared or approved. This test has been authorized by FDA under an Emergency Use Authorization (EUA). This test is only authorized for the duration of time the declaration that circumstances exist justifying the authorization of the emergency use of in vitro diagnostic tests for detection of SARS-CoV-2 virus and/or diagnosis of COVID-19 infection under section 564(b)(1) of the Act, 21 U.S.C. 182XHB-7(J)(6), unless the authorization is terminated or revoked sooner. When  diagnostic testing is negative, the possibility of a false negative result should be considered in the context of a patient's recent exposures and the presence of clinical signs and symptoms consistent with COVID-19. An individual without symptoms of COVID-19 and who is not shedding SARS-CoV-2 virus would expect to have a negative (not detected) result in this assay. Performed  At: [facility] [place], [place] [id number] Rush Farmer MD FB:[id number]    [place]  Final    Comment: Performed at [facility], [place],  [place], [place] [address]  SARS Coronavirus 2 (CEPHEID - Performed in [place] hospital lab), Hosp Order     Status: None   Collection Time: 07/07/19  2:28 PM   Specimen: Nasopharyngeal Swab  Result Value Ref Range Status   SARS Coronavirus 2 NEGATIVE NEGATIVE Final    Comment: (NOTE) If result is NEGATIVE SARS-CoV-2 target nucleic acids are NOT DETECTED. The SARS-CoV-2 RNA is generally detectable in upper and lower  respiratory specimens during the acute phase of infection. The lowest  concentration of SARS-CoV-2 viral copies this assay can detect is 250  copies / mL. A negative result does not preclude SARS-CoV-2 infection  and should not be used as the sole basis for treatment or other  patient management decisions.  A negative result may occur with  improper specimen collection / handling, submission of specimen other  than nasopharyngeal swab, presence of viral mutation(s) within the  areas targeted by this assay, and inadequate number of viral copies  (<250 copies / mL). A negative result must be combined with clinical  observations, patient history, and epidemiological information. If result is POSITIVE SARS-CoV-2 target nucleic acids are DETECTED. The SARS-CoV-2 RNA is generally detectable in upper and lower  respiratory specimens dur ing the acute phase of infection.  Positive  results  are indicative of active infection with SARS-CoV-2.  Clinical  correlation with patient history and other diagnostic information is  necessary to determine patient infection status.  Positive results do  not rule out bacterial infection or co-infection with other viruses. If result is PRESUMPTIVE POSTIVE SARS-CoV-2 nucleic acids MAY BE PRESENT.   A presumptive positive result was obtained on the submitted specimen  and confirmed on repeat testing.  While 2019 novel coronavirus  (SARS-CoV-2) nucleic acids may be present in the submitted sample  additional confirmatory testing may be necessary for epidemiological  and / or clinical management purposes  to differentiate between  SARS-CoV-2 and other Sarbecovirus currently known to infect humans.  If clinically indicated additional testing with an alternate test  methodology [phone]) is advised. The SARS-CoV-2 RNA is generally  detectable in upper and lower respiratory sp ecimens during the acute  phase of infection. The expected result is Negative. Fact Sheet for Patients:  StrictlyIdeas.no Fact Sheet for Healthcare Providers: BankingDealers.co.za This test is not yet approved or cleared by the [place] FDA and has been authorized for detection and/or diagnosis of SARS-CoV-2 by FDA under an Emergency Use Authorization (EUA).  This EUA will remain in effect (meaning this test can be used) for the duration of the COVID-19 declaration under Section 564(b)(1) of the Act, 21 U.S.C. section 360bbb-3(b)(1), unless the authorization is terminated or revoked sooner. Performed at [facility], [address] [place], [place] [address]     RADIOLOGY:  Mr Thoracic Spine Wo Contrast  Result Date: 07/06/2019 CLINICAL DATA:  New onset thoracic back pain. EXAM: MRI THORACIC SPINE WITHOUT CONTRAST TECHNIQUE: Multiplanar, multisequence MR imaging of the thoracic spine was performed. No  intravenous contrast was administered. COMPARISON:  Chest radiography 07/05/2019 FINDINGS: Alignment:  Thoracic curvature convex to the right. Vertebrae: Old inferior endplate compression fracture at T12 which is healed without edema. No other regional fracture or focal bone finding. Cord:  No cord compression or primary cord lesion. Paraspinal and other soft tissues: Negative Disc levels: Ordinary thoracic region degenerative disc disease with loss of disc height and mild disc bulges. No disc herniation or compressive stenosis of the canal or foramina. Ordinary facet osteoarthritis.  IMPRESSION: Old inferior endplate fracture at G18 which is healed without edema. Thoracic curvature convex to the right. Ordinary age related degenerative disc disease throughout the thoracic region but without compressive narrowing of the canal or foramina. Ordinary facet osteoarthritis. Findings could certainly relate to generalized back with Electronically Signed   By: Nelson Chimes M.D.   On: 07/06/2019 17:49   Mr Lumbar Spine Wo Contrast  Result Date: 07/06/2019 CLINICAL DATA:  New onset leg and back pain. Previous lumbar fusion. EXAM: MRI LUMBAR SPINE WITHOUT CONTRAST TECHNIQUE: Multiplanar, multisequence MR imaging of the lumbar spine was performed. No intravenous contrast was administered. COMPARISON:  Radiography 07/05/2019 FINDINGS: Segmentation:  5 lumbar type vertebral bodies. Alignment:  Mild curvature convex to the left. Vertebrae:  Previous discectomy and fusion procedure L4 to sacrum. Conus medullaris and cauda equina: Conus extends to the L1 level. Conus and cauda equina appear normal. Paraspinal and other soft tissues: Negative Disc levels: T12-L1: Disc bulge. Narrowing of the subarachnoid space but no neural compression. Old healed inferior endplate deformity at E99. L1-2: Disc degeneration with loss of disc height. Endplate osteophytes and bulging of the disc. Facet osteoarthritis. Canal narrowing with some  potential for neural compression in the lateral recesses and foramina. L2-3: Bulging of the disc. Facet hypertrophy. Moderate multifactorial stenosis that could cause neural compression. L3-4: Disc degeneration with disc space narrowing. Limited information based on pronounced artifact from fusion hardware. L4 to sacrum: Previous lumbar fusion surgery. Fusion is solid. Limited information based on fusion hardware artifact. IMPRESSION: Old healed inferior endplate fracture at B71. No acute lumbar region fracture. Solid union in the fusion segment from L4 to the sacrum. Limited detail in that region because of artifact from fusion hardware. This also makes evaluation of L3-4 markedly suboptimal. Degenerative disc disease and facet disease at L1-2 and L2-3 with some degree of stenosis of the canal and lateral recesses. Detail is poor because of motion. Some potential for neural compression at those levels. Electronically Signed   By: Nelson Chimes M.D.   On: 07/06/2019 17:53   Dg Chest Port 1 View  Result Date: 07/07/2019 CLINICAL DATA:  Cough. EXAM: PORTABLE CHEST 1 VIEW COMPARISON:  Radiographs of July 05, 2019. FINDINGS: Stable cardiomegaly. Atherosclerosis of thoracic aorta is noted. No pneumothorax or pleural effusion is noted. Mild bibasilar subsegmental atelectasis is noted. Bony thorax is unremarkable. IMPRESSION: Mild bibasilar subsegmental atelectasis. Aortic Atherosclerosis (ICD10-I70.0). Electronically Signed   By: Marijo Conception M.D.   On: 07/07/2019 15:27      Management plans discussed with the patient, and he is in agreement.  Left message for daughter on the phone.  CODE STATUS:     Code Status Orders  (From admission, onward)         Start     Ordered   07/05/19 1836  Limited resuscitation (code)  Continuous    Question Answer Comment  In the event of cardiac or respiratory ARREST: Initiate Code Blue, Call Rapid Response Yes   In the event of cardiac or respiratory ARREST:  Perform CPR Yes   In the event of cardiac or respiratory ARREST: Perform Intubation/Mechanical Ventilation No   In the event of cardiac or respiratory ARREST: Use NIPPV/BiPAp only if indicated Yes   In the event of cardiac or respiratory ARREST: Administer ACLS medications if indicated Yes   In the event of cardiac or respiratory ARREST: Perform Defibrillation or Cardioversion if indicated Yes      07/05/19 1835  Code Status History    Date Active Date Inactive Code Status Order ID Comments User Context   07/05/2019 1806 07/05/2019 1835 Full Code [id number]  Sela Hua, MD Inpatient   02/24/2019 1022 03/01/2019 1628 DNR [id number]  Hillary Bow, MD ED   02/24/2019 0940 02/24/2019 1022 Full Code [id number]  Hillary Bow, MD ED   Advance Care Planning Activity    Advance Directive Documentation     Most Recent Value  Type of Advance Directive  Healthcare Power of Attorney  Pre-existing out of facility DNR order (yellow form or pink MOST form)  -  "MOST" Form in Place?  -      TOTAL TIME TAKING CARE OF THIS PATIENT: 35 minutes.    Loletha Grayer M.D on 07/08/2019 at 3:50 PM  Between 7am to 6pm - Pager - [phone]  After 6pm go to [email or web address] - password [email or web address] [facility]  [phone]  CC: Primary care physician; Valera Castle, MD

## 2019-07-08 NOTE — Care Management Important Message (Signed)
Important Message  Patient Details  Name: ZAC TORTI MRN: [id number] Date of Birth: May 22, 1930   Medicare Important Message Given:  Yes     Dannette Barbara 07/08/2019, 11:16 AM

## 2019-07-08 NOTE — TOC Progression Note (Signed)
Transition of Care [facility]) - Progression Note    Patient Details  Name: Jacob Simpson MRN: [id number] Date of Birth: 04-26-1930  [facility] CM/SW Contact  Shela Leff, [place] Phone Number: 07/08/2019, 10:49 AM  Clinical Narrative:   Patient is discharging today with home health through [facility]. CSW has notified [place] with [facility] of patient's discharge home today.    Expected Discharge Plan: Home/Self Care Barriers to Discharge: Continued Medical Work up  Expected Discharge Plan and Services Expected Discharge Plan: Home/Self Care   Discharge Planning Services: CM Consult   Living arrangements for the past 2 months: Single Family Home Expected Discharge Date: 07/08/19                         HH Arranged: RN, PT [facility] Agency: [place] Date [facility] Agency Contacted: 07/07/19   Representative spoke with at [place]: [place] Interventions    Readmission Risk Interventions Readmission Risk Prevention Plan 07/06/2019  Transportation Screening Complete  PCP or Specialist Appt within 5-7 Days Complete  Home Care Screening Complete  Medication Review (RN CM) Complete  Some recent data might be hidden

## 2019-07-27 ENCOUNTER — Other Ambulatory Visit: Payer: Self-pay | Admitting: Cardiovascular Disease

## 2019-07-27 ENCOUNTER — Other Ambulatory Visit: Payer: Self-pay

## 2019-07-27 ENCOUNTER — Ambulatory Visit (INDEPENDENT_AMBULATORY_CARE_PROVIDER_SITE_OTHER): Payer: Medicare Other | Admitting: Cardiovascular Disease

## 2019-07-27 ENCOUNTER — Encounter: Payer: Self-pay | Admitting: Cardiovascular Disease

## 2019-07-27 VITALS — BP 140/70 | HR 60 | Ht 69.0 in | Wt 191.0 lb

## 2019-07-27 DIAGNOSIS — I350 Nonrheumatic aortic (valve) stenosis: Secondary | ICD-10-CM

## 2019-07-27 LAB — BASIC METABOLIC PANEL
BUN/Creatinine Ratio: 14 (ref 10–24)
BUN: 18 mg/dL (ref 8–27)
CO2: 20 mmol/L (ref 20–29)
Calcium: 8.6 mg/dL (ref 8.6–10.2)
Chloride: 105 mmol/L (ref 96–106)
Creatinine, Ser: 1.25 mg/dL (ref 0.76–1.27)
GFR calc Af Amer: 59 mL/min/{1.73_m2} — ABNORMAL LOW (ref >59)
GFR calc non Af Amer: 51 mL/min/{1.73_m2} — ABNORMAL LOW (ref >59)
Glucose: 115 mg/dL — ABNORMAL HIGH (ref 65–99)
Potassium: 4.1 mmol/L (ref 3.5–5.2)
Sodium: 142 mmol/L (ref 134–144)

## 2019-07-27 LAB — CBC WITH DIFFERENTIAL/PLATELET
Basophils Absolute: 0.1 10*3/uL (ref 0.0–0.2)
Basos: 1 %
EOS (ABSOLUTE): 0.8 10*3/uL — ABNORMAL HIGH (ref 0.0–0.4)
Eos: 8 %
Hematocrit: 38.6 % (ref 37.5–51.0)
Hemoglobin: 12.9 g/dL — ABNORMAL LOW (ref 13.0–17.7)
Immature Grans (Abs): 0 10*3/uL (ref 0.0–0.1)
Immature Granulocytes: 0 %
Lymphocytes Absolute: 2.4 10*3/uL (ref 0.7–3.1)
Lymphs: 26 %
MCH: 32.2 pg (ref 26.6–33.0)
MCHC: 33.4 g/dL (ref 31.5–35.7)
MCV: 96 fL (ref 79–97)
Monocytes Absolute: 1 10*3/uL — ABNORMAL HIGH (ref 0.1–0.9)
Monocytes: 11 %
Neutrophils Absolute: 5.2 10*3/uL (ref 1.4–7.0)
Neutrophils: 54 %
Platelets: 167 10*3/uL (ref 150–450)
RBC: 4.01 x10E6/uL — ABNORMAL LOW (ref 4.14–5.80)
RDW: 12.4 % (ref 11.6–15.4)
WBC: 9.5 10*3/uL (ref 3.4–10.8)

## 2019-07-27 NOTE — Progress Notes (Signed)
[facility]   MULTIDISCIPLINARY HEART VALVE TEAM  Date:  07/27/2019   ID:  Jacob Simpson, DOB [birth year], MRN [id number]  PCP:  Valera Castle, MD   Chief Complaint  Patient presents with   Shortness of Breath     HISTORY OF PRESENT ILLNESS: Jacob Simpson is a 85 y.o. male who presents for evaluation of severe aortic stenosis, referred by Dr Fletcher Anon.  The patient is here with his oldest daughter, [name], today. He lives in [place], [place]. 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   He has known of a heart murmur for many years. He remembers that students were commonly brought into the exam room to listen to his heart. He has been short of breath for at least a few years, and he states others have noticed more than he has. He was first told of having congestive heart failure in March 2020 when he was hospitalized with pneumonia and CHF. The patient has had B Cell lymphoma treated with chemotherapy and has reportedly had multiple episodes of pneumonia over the years.   The patient has had regular dental care and reports no significant problems with his teeth. He has 'partial' lower dentures..  The patient continues to have shortness of breath with activity. His daughter reports that he is short of breath just walking from room to room in the house. He denies chest pain or pressure. He's had some problems with dizziness in the past, worked up at [employer], but no recent issues. No syncope, orthopnea, or PND. He currently is limited by some problems with his right leg, and he just started using a walker after recent hospital admission.   The patient has been reasonably healthy until the past year.  He did undergo bilateral knee replacements back in the 1980s.  He has had back surgery as well.  He has been on oral anticoagulation for a few years now for treatment of  paroxysmal atrial fibrillation.  He otherwise has remained independent until he has developed progressive symptoms over the past few months.  Past Medical History:  Diagnosis Date   A-fib [facility])    Aortic aneurysm (HCC)    Aortic stenosis    Asthma    CHF (congestive heart failure) (HCC)    Hammertoe    Heart murmur    HTN (hypertension)    TIA (transient ischemic attack)     Current Outpatient Medications  Medication Sig Dispense Refill   acetaminophen (TYLENOL) 500 MG tablet Take 1,000 mg by mouth every 6 (six) hours as needed for mild pain, moderate pain or fever.     apixaban (ELIQUIS) 2.5 MG TABS tablet Take 1 tablet (2.5 mg total) by mouth 2 (two) times daily.     atorvastatin (LIPITOR) 40 MG tablet Take 40 mg by mouth daily.     azelastine (ASTELIN) 0.1 % nasal spray Place 1 spray into both nostrils 2 (two) times daily as needed for rhinitis or allergies. Use in each nostril as directed     cetirizine (ZYRTEC) 10 MG tablet Take 10 mg by mouth daily as needed for allergies.     famotidine (PEPCID) 10 MG tablet Take 10 mg by mouth daily as needed for heartburn or indigestion.     fluticasone (FLOVENT HFA) 110 MCG/ACT inhaler Inhale 1 puff into the lungs 2 (two) times daily as needed (  respiratory symptoms).      furosemide (LASIX) 20 MG tablet Take 1 tablet (20 mg total) by mouth daily. 30 tablet 0   gabapentin (NEURONTIN) 100 MG capsule Take 1 capsule (100 mg total) by mouth 3 (three) times daily as needed (back pain). 30 capsule 0   guaiFENesin (MUCINEX) 600 MG 12 hr tablet Take 600 mg by mouth 2 (two) times daily as needed for cough or to loosen phlegm.     Ipratropium-Albuterol (COMBIVENT) 20-100 MCG/ACT AERS respimat Inhale 1 puff into the lungs every 6 (six) hours. 4 g 0   lactulose (CHRONULAC) 10 GM/15ML solution Take by mouth.     meclizine (ANTIVERT) 12.5 MG tablet Take 12.5 mg by mouth 3 (three) times daily as needed for dizziness.     metoprolol  succinate (TOPROL-XL) 50 MG 24 hr tablet Take 1 tablet (50 mg total) by mouth daily. Take with or immediately following a meal. 30 tablet 0   mirtazapine (REMERON) 7.5 MG tablet Take 7.5 mg by mouth at bedtime.     omeprazole (PRILOSEC) 20 MG capsule Take 20 mg by mouth daily as needed (stomach acid).     ondansetron (ZOFRAN-ODT) 8 MG disintegrating tablet Take 8 mg by mouth every 8 (eight) hours as needed for nausea or vomiting.     polyethylene glycol (MIRALAX) 17 g packet Take 17 g by mouth daily as needed for moderate constipation. 14 each 0   tamsulosin (FLOMAX) 0.4 MG CAPS capsule Take 1 capsule (0.4 mg total) by mouth daily after supper. 30 capsule 0   No current facility-administered medications for this visit.     ALLERGIES:   Penicillins   SOCIAL HISTORY:  The patient  reports that he has quit smoking. He has never used smokeless tobacco. He reports that he does not drink alcohol or use drugs.   FAMILY HISTORY:  The patient's family history includes Heart disease in his father.   REVIEW OF SYSTEMS:  Positive for memory loss, right leg pain and weakness.   All other systems are reviewed and negative.   PHYSICAL EXAM: VS:  BP 140/70    Pulse 60    Ht 5\' 9"  (1.753 m)    Wt 191 lb (86.6 kg)    SpO2 93%    BMI 28.21 kg/m  , BMI Body mass index is 28.21 kg/m. GEN: Well nourished, well developed, pleasant elderly male in no acute distress HEENT: normal Neck: No JVD. carotids 2+ with bilateral bruits Cardiac: The heart is RRR with 3/6 harsh late peaking systolic murmur at the right upper sternal border.  1+ pretibial and ankle edema bilaterally Respiratory:  clear to auscultation bilaterally GI: soft, nontender, nondistended, + BS MS: no deformity or atrophy Skin: warm and dry, no rash Neuro:  Strength and sensation are intact Psych: euthymic mood, full affect  EKG:  EKG from 07/05/2019 reviewed and demonstrates NSR 72 bpm, single PAC  RECENT LABS: 02/25/2019: B Natriuretic  Peptide 873.0 07/06/2019: Hemoglobin 13.2; Platelets 104 07/07/2019: ALT 28; BUN 25; Creatinine, Ser 1.46; Potassium 4.7; Sodium 138  No results found for requested labs within last 8760 hours.   Estimated Creatinine Clearance: 38.9 mL/min (A) (by C-G formula based on SCr of 1.46 mg/dL (H)).   Wt Readings from Last 3 Encounters:  07/27/19 191 lb (86.6 kg)  07/08/19 190 lb (86.2 kg)  06/25/19 193 lb (87.5 kg)     CARDIAC STUDIES:  Echo:  IMPRESSIONS    1. The left ventricle has normal systolic  function, with an ejection fraction of 55-60%. The cavity size was normal. There is moderately increased left ventricular wall thickness. Left ventricular diastolic function could not be evaluated.  2. The right ventricle has normal systolic function. The cavity was mildly enlarged. There is no increase in right ventricular wall thickness. Right ventricular systolic pressure is severely elevated with an estimated pressure of 71.9 mmHg.  3. Left atrial size was mildly dilated.  4. Right atrial size was moderately dilated.  5. The mitral valve is degenerative. Mild thickening of the mitral valve leaflet. Moderate calcification of the mitral valve leaflet. Mitral valve regurgitation is moderate by color flow Doppler.  6. The tricuspid valve is not well visualized. Tricuspid valve regurgitation is mild-moderate.  7. The aortic valve was not well visualized. Moderate thickening of the aortic valve. Aortic valve regurgitation is mild by color flow Doppler. Severe stenosis of the aortic valve.  8. The aortic root is normal in size and structure.  FINDINGS  Left Ventricle: The left ventricle has normal systolic function, with an ejection fraction of 55-60%. The cavity size was normal. There is moderately increased left ventricular wall thickness. Left ventricular diastolic function could not be evaluated.  Right Ventricle: The right ventricle has normal systolic function. The cavity was mildly enlarged.  There is no increase in right ventricular wall thickness. Right ventricular systolic pressure is severely elevated with an estimated pressure of 71.9 mmHg.  Left Atrium: Left atrial size was mildly dilated.  Right Atrium: Right atrial size was moderately dilated. Right atrial pressure is estimated at 3 mmHg.  Interatrial Septum: No atrial level shunt detected by color flow Doppler.  Pericardium: There is no evidence of pericardial effusion.  Mitral Valve: The mitral valve is degenerative in appearance. Mild thickening of the mitral valve leaflet. Moderate calcification of the mitral valve leaflet. Mitral valve regurgitation is moderate by color flow Doppler.  Tricuspid Valve: The tricuspid valve is not well visualized. Tricuspid valve regurgitation is mild-moderate by color flow Doppler.  Aortic Valve: The aortic valve was not well visualized Moderate thickening of the aortic valve. Aortic valve regurgitation is mild by color flow Doppler. There is Severe stenosis of the aortic valve, with a calculated valve area of 0.69 cm.  Pulmonic Valve: The pulmonic valve was not well visualized. Pulmonic valve regurgitation is not visualized by color flow Doppler. No evidence of pulmonic stenosis.  Aorta: The aortic root is normal in size and structure.  Pulmonary Artery: The pulmonary artery is not well seen.  Venous: The inferior vena cava measures 1.90 cm, is normal in size with greater than 50% respiratory variability.    +--------------+--------++  LEFT VENTRICLE            +--------------+---------++ +--------------+--------++  Diastology                  PLAX 2D                   +--------------+---------++ +--------------+--------++  LV e' lateral: 7.83 cm/s    LVIDd:         4.18 cm    +--------------+---------++ +--------------+--------++  LV e' medial:  7.40 cm/s    LVIDs:         2.99 cm    +--------------+---------++ +--------------+--------++  LV PW:         1.31 cm     +--------------+--------++  LV IVS:        1.51 cm    +--------------+--------++  LVOT diam:  2.10 cm    +--------------+--------++  LV SV:         43 ml      +--------------+--------++  LV SV Index:   20.73      +--------------+--------++  LVOT Area:     3.46 cm   +--------------+--------++                            +--------------+--------++  +---------------+----------++  RIGHT VENTRICLE              +---------------+----------++  RV Basal diam:  3.85 cm      +---------------+----------++  RV S prime:     13.20 cm/s   +---------------+----------++  TAPSE (M-mode): 3.9 cm       +---------------+----------++  +---------------+-------++-----------++  LEFT ATRIUM              Index         +---------------+-------++-----------++  LA diam:        4.30 cm  2.12 cm/m    +---------------+-------++-----------++  LA Vol (A2C):   98.9 ml  48.79 ml/m   +---------------+-------++-----------++  LA Vol (A4C):   56.5 ml  27.88 ml/m   +---------------+-------++-----------++  LA Biplane Vol: 75.0 ml  37.00 ml/m   +---------------+-------++-----------++ +------------+---------++-----------++  RIGHT ATRIUM            Index         +------------+---------++-----------++  RA Area:     27.40 cm                +------------+---------++-----------++  RA Volume:   89.70 ml   44.26 ml/m   +------------+---------++-----------++  +------------------+------------++ +---------------+--------++  AORTIC VALVE                       PULMONIC VALVE             +------------------+------------++ +---------------+--------++  AV Area (Vmax):    0.63 cm        PV Vmax:        0.87 m/s   +------------------+------------++ +---------------+--------++  AV Area (Vmean):   0.56 cm        PV Peak grad:   3.0 mmHg   +------------------+------------++ +---------------+--------++  AV Area (VTI):     0.69 cm        RVOT Peak grad: 2 mmHg     +------------------+------------++  +---------------+--------++  AV Vmax:           424.00 cm/s    +------------------+------------++  AV Vmean:          304.667 cm/s   +------------------+------------++  AV VTI:            0.839 m        +------------------+------------++  AV Peak Grad:      71.9 mmHg      +------------------+------------++  AV Mean Grad:      58.0 mmHg      +------------------+------------++  LVOT Vmax:         76.90 cm/s     +------------------+------------++  LVOT Vmean:        49.200 cm/s    +------------------+------------++  LVOT VTI:          0.168 m        +------------------+------------++  LVOT/AV VTI ratio: 0.20           +------------------+------------++   +-------------+-------++  AORTA                   +-------------+-------++  Ao Root diam: 2.90 cm   +-------------+-------++  +---------------+-----------++  TRICUSPID VALVE               +---------------+-----------++  TR Peak grad:   68.9 mmHg     +---------------+-----------++  TR Vmax:        415.00 cm/s   +---------------+-----------++   +--------------+-------+  SHUNTS                  +--------------+-------+  Systemic VTI:  0.17 m   +--------------+-------+  Systemic Diam: 2.10 cm  +--------------+-------+  +---------+-------+  IVC                +---------+-------+  IVC diam: 1.90 cm  +---------+-------+  STS RISK CALCULATOR: Isolated AVR Risk of Mortality: 2.025% Renal Failure: 3.222% Permanent Stroke: 1.434% Prolonged Ventilation: 6.995% DSW Infection: 0.142% Reoperation: 3.805% Morbidity or Mortality: 11.553% Short Length of Stay: 22.256% Long Length of Stay: 7.421%  ASSESSMENT AND PLAN: 85 year old gentleman with severe, stage D1 aortic stenosis with New York Heart Association functional class III symptoms of chronic diastolic heart failure.  I have reviewed the natural history of aortic stenosis with the patient and their family members who are present today. We have discussed the  limitations of medical therapy and the poor prognosis associated with symptomatic aortic stenosis. We have reviewed potential treatment options, including palliative medical therapy, conventional surgical aortic valve replacement, and transcatheter aortic valve replacement. We discussed treatment options in the context of the patient's specific comorbid medical conditions.    I have personally reviewed the patient's most recent echo study which demonstrates preserved LV systolic function with LVEF 55%, severe calcification and restriction of the aortic valve with peak and mean gradients of 72 and 58 mmHg, respectively.  The calculated aortic valve area 0.6 cm and the dimensionless index is 0.2.  Considering the patient's advanced age, physical limitation related to advanced arthritis, chronic kidney disease, and paroxysmal atrial fibrillation, TAVR would provide a lower morbidity treatment option than conventional surgery.  The patient would like to proceed with treatment in order to improve his quality of life and prevent further progression of symptomatic congestive heart failure.  He understands that next steps involve right and left heart catheterization.  Will arrange this to be performed in [facility] by Dr. Fletcher Anon.  The patient will be given specific instructions regarding his medications in order to minimize his risk of contrast nephropathy in the setting of stage III chronic kidney disease.  I have reviewed the risks, indications, and alternatives to cardiac catheterization, possible angioplasty, and stenting with the patient. Risks include but are not limited to bleeding, infection, vascular injury, stroke, myocardial infection, arrhythmia, kidney injury, radiation-related injury in the case of prolonged fluoroscopy use, emergency cardiac surgery, and death. The patient understands the risks of serious complication is 1-2 in 2956 with diagnostic cardiac cath and 1-2%  or less with angioplasty/stenting.   Once his cardiac catheterization is completed, he will undergo CT angiography studies of the heart as well as the chest, abdomen, and pelvis.  Similar precautions will be taken to reduce the risk of contrast-induced nephropathy.  Following the studies, the patient will go for formal cardiac surgical consultation as part of a multidisciplinary approach to his care.  All of this is explained in detail to the patient and his daughter today.  We reviewed procedural animation of the TAVR procedure, as well as the expected risks and recovery period related to TAVR.  Gunnar Fusi  Nhyla Nappi 07/27/2019 11:03 AM     [place] [address]  [phone] (office) [phone] (fax)

## 2019-07-27 NOTE — H&P (View-Only) (Signed)
[facility]   MULTIDISCIPLINARY HEART VALVE TEAM  Date:  07/27/2019   ID:  Jacob Simpson, DOB 1930/03/27, MRN [id number]  PCP:  Jacob Castle, MD   Chief Complaint  Patient presents with   Shortness of Breath     HISTORY OF PRESENT ILLNESS: Jacob Simpson is a 85 y.o. male who presents for evaluation of severe aortic stenosis, referred by Dr Jacob Simpson.  The patient is here with his oldest daughter, Jacob Simpson, today. He lives in [place], [place]. 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   He has known of a heart murmur for many years. He remembers that students were commonly brought into the exam room to listen to his heart. He has been short of breath for at least a few years, and he states others have noticed more than he has. He was first told of having congestive heart failure in March 2020 when he was hospitalized with pneumonia and CHF. The patient has had B Cell lymphoma treated with chemotherapy and has reportedly had multiple episodes of pneumonia over the years.   The patient has had regular dental care and reports no significant problems with his teeth. He has 'partial' lower dentures..  The patient continues to have shortness of breath with activity. His daughter reports that he is short of breath just walking from room to room in the house. He denies chest pain or pressure. He's had some problems with dizziness in the past, worked up at [employer], but no recent issues. No syncope, orthopnea, or PND. He currently is limited by some problems with his right leg, and he just started using a walker after recent hospital admission.   The patient has been reasonably healthy until the past year.  He did undergo bilateral knee replacements back in the 1980s.  He has had back surgery as well.  He has been on oral anticoagulation for a few years now for treatment of  paroxysmal atrial fibrillation.  He otherwise has remained independent until he has developed progressive symptoms over the past few months.  Past Medical History:  Diagnosis Date   A-fib [facility])    Aortic aneurysm (HCC)    Aortic stenosis    Asthma    CHF (congestive heart failure) (HCC)    Hammertoe    Heart murmur    HTN (hypertension)    TIA (transient ischemic attack)     Current Outpatient Medications  Medication Sig Dispense Refill   acetaminophen (TYLENOL) 500 MG tablet Take 1,000 mg by mouth every 6 (six) hours as needed for mild pain, moderate pain or fever.     apixaban (ELIQUIS) 2.5 MG TABS tablet Take 1 tablet (2.5 mg total) by mouth 2 (two) times daily.     atorvastatin (LIPITOR) 40 MG tablet Take 40 mg by mouth daily.     azelastine (ASTELIN) 0.1 % nasal spray Place 1 spray into both nostrils 2 (two) times daily as needed for rhinitis or allergies. Use in each nostril as directed     cetirizine (ZYRTEC) 10 MG tablet Take 10 mg by mouth daily as needed for allergies.     famotidine (PEPCID) 10 MG tablet Take 10 mg by mouth daily as needed for heartburn or indigestion.     fluticasone (FLOVENT HFA) 110 MCG/ACT inhaler Inhale 1 puff into the lungs 2 (two) times daily as needed (  respiratory symptoms).      furosemide (LASIX) 20 MG tablet Take 1 tablet (20 mg total) by mouth daily. 30 tablet 0   gabapentin (NEURONTIN) 100 MG capsule Take 1 capsule (100 mg total) by mouth 3 (three) times daily as needed (back pain). 30 capsule 0   guaiFENesin (MUCINEX) 600 MG 12 hr tablet Take 600 mg by mouth 2 (two) times daily as needed for cough or to loosen phlegm.     Ipratropium-Albuterol (COMBIVENT) 20-100 MCG/ACT AERS respimat Inhale 1 puff into the lungs every 6 (six) hours. 4 g 0   lactulose (CHRONULAC) 10 GM/15ML solution Take by mouth.     meclizine (ANTIVERT) 12.5 MG tablet Take 12.5 mg by mouth 3 (three) times daily as needed for dizziness.     metoprolol  succinate (TOPROL-XL) 50 MG 24 hr tablet Take 1 tablet (50 mg total) by mouth daily. Take with or immediately following a meal. 30 tablet 0   mirtazapine (REMERON) 7.5 MG tablet Take 7.5 mg by mouth at bedtime.     omeprazole (PRILOSEC) 20 MG capsule Take 20 mg by mouth daily as needed (stomach acid).     ondansetron (ZOFRAN-ODT) 8 MG disintegrating tablet Take 8 mg by mouth every 8 (eight) hours as needed for nausea or vomiting.     polyethylene glycol (MIRALAX) 17 g packet Take 17 g by mouth daily as needed for moderate constipation. 14 each 0   tamsulosin (FLOMAX) 0.4 MG CAPS capsule Take 1 capsule (0.4 mg total) by mouth daily after supper. 30 capsule 0   No current facility-administered medications for this visit.     ALLERGIES:   Penicillins   SOCIAL HISTORY:  The patient  reports that he has quit smoking. He has never used smokeless tobacco. He reports that he does not drink alcohol or use drugs.   FAMILY HISTORY:  The patient's family history includes Heart disease in his father.   REVIEW OF SYSTEMS:  Positive for memory loss, right leg pain and weakness.   All other systems are reviewed and negative.   PHYSICAL EXAM: VS:  BP 140/70    Pulse 60    Ht 5\' 9"  (1.753 m)    Wt 191 lb (86.6 kg)    SpO2 93%    BMI 28.21 kg/m  , BMI Body mass index is 28.21 kg/m. GEN: Well nourished, well developed, pleasant elderly male in no acute distress HEENT: normal Neck: No JVD. carotids 2+ with bilateral bruits Cardiac: The heart is RRR with 3/6 harsh late peaking systolic murmur at the right upper sternal border.  1+ pretibial and ankle edema bilaterally Respiratory:  clear to auscultation bilaterally GI: soft, nontender, nondistended, + BS MS: no deformity or atrophy Skin: warm and dry, no rash Neuro:  Strength and sensation are intact Psych: euthymic mood, full affect  EKG:  EKG from 07/05/2019 reviewed and demonstrates NSR 72 bpm, single PAC  RECENT LABS: 02/25/2019: B Natriuretic  Peptide 873.0 07/06/2019: Hemoglobin 13.2; Platelets 104 07/07/2019: ALT 28; BUN 25; Creatinine, Ser 1.46; Potassium 4.7; Sodium 138  No results found for requested labs within last 8760 hours.   Estimated Creatinine Clearance: 38.9 mL/min (A) (by C-G formula based on SCr of 1.46 mg/dL (H)).   Wt Readings from Last 3 Encounters:  07/27/19 191 lb (86.6 kg)  07/08/19 190 lb (86.2 kg)  06/25/19 193 lb (87.5 kg)     CARDIAC STUDIES:  Echo:  IMPRESSIONS    1. The left ventricle has normal systolic  function, with an ejection fraction of 55-60%. The cavity size was normal. There is moderately increased left ventricular wall thickness. Left ventricular diastolic function could not be evaluated.  2. The right ventricle has normal systolic function. The cavity was mildly enlarged. There is no increase in right ventricular wall thickness. Right ventricular systolic pressure is severely elevated with an estimated pressure of 71.9 mmHg.  3. Left atrial size was mildly dilated.  4. Right atrial size was moderately dilated.  5. The mitral valve is degenerative. Mild thickening of the mitral valve leaflet. Moderate calcification of the mitral valve leaflet. Mitral valve regurgitation is moderate by color flow Doppler.  6. The tricuspid valve is not well visualized. Tricuspid valve regurgitation is mild-moderate.  7. The aortic valve was not well visualized. Moderate thickening of the aortic valve. Aortic valve regurgitation is mild by color flow Doppler. Severe stenosis of the aortic valve.  8. The aortic root is normal in size and structure.  FINDINGS  Left Ventricle: The left ventricle has normal systolic function, with an ejection fraction of 55-60%. The cavity size was normal. There is moderately increased left ventricular wall thickness. Left ventricular diastolic function could not be evaluated.  Right Ventricle: The right ventricle has normal systolic function. The cavity was mildly enlarged.  There is no increase in right ventricular wall thickness. Right ventricular systolic pressure is severely elevated with an estimated pressure of 71.9 mmHg.  Left Atrium: Left atrial size was mildly dilated.  Right Atrium: Right atrial size was moderately dilated. Right atrial pressure is estimated at 3 mmHg.  Interatrial Septum: No atrial level shunt detected by color flow Doppler.  Pericardium: There is no evidence of pericardial effusion.  Mitral Valve: The mitral valve is degenerative in appearance. Mild thickening of the mitral valve leaflet. Moderate calcification of the mitral valve leaflet. Mitral valve regurgitation is moderate by color flow Doppler.  Tricuspid Valve: The tricuspid valve is not well visualized. Tricuspid valve regurgitation is mild-moderate by color flow Doppler.  Aortic Valve: The aortic valve was not well visualized Moderate thickening of the aortic valve. Aortic valve regurgitation is mild by color flow Doppler. There is Severe stenosis of the aortic valve, with a calculated valve area of 0.69 cm.  Pulmonic Valve: The pulmonic valve was not well visualized. Pulmonic valve regurgitation is not visualized by color flow Doppler. No evidence of pulmonic stenosis.  Aorta: The aortic root is normal in size and structure.  Pulmonary Artery: The pulmonary artery is not well seen.  Venous: The inferior vena cava measures 1.90 cm, is normal in size with greater than 50% respiratory variability.    +--------------+--------++  LEFT VENTRICLE            +--------------+---------++ +--------------+--------++  Diastology                  PLAX 2D                   +--------------+---------++ +--------------+--------++  LV e' lateral: 7.83 cm/s    LVIDd:         4.18 cm    +--------------+---------++ +--------------+--------++  LV e' medial:  7.40 cm/s    LVIDs:         2.99 cm    +--------------+---------++ +--------------+--------++  LV PW:         1.31 cm     +--------------+--------++  LV IVS:        1.51 cm    +--------------+--------++  LVOT diam:  2.10 cm    +--------------+--------++  LV SV:         43 ml      +--------------+--------++  LV SV Index:   20.73      +--------------+--------++  LVOT Area:     3.46 cm   +--------------+--------++                            +--------------+--------++  +---------------+----------++  RIGHT VENTRICLE              +---------------+----------++  RV Basal diam:  3.85 cm      +---------------+----------++  RV S prime:     13.20 cm/s   +---------------+----------++  TAPSE (M-mode): 3.9 cm       +---------------+----------++  +---------------+-------++-----------++  LEFT ATRIUM              Index         +---------------+-------++-----------++  LA diam:        4.30 cm  2.12 cm/m    +---------------+-------++-----------++  LA Vol (A2C):   98.9 ml  48.79 ml/m   +---------------+-------++-----------++  LA Vol (A4C):   56.5 ml  27.88 ml/m   +---------------+-------++-----------++  LA Biplane Vol: 75.0 ml  37.00 ml/m   +---------------+-------++-----------++ +------------+---------++-----------++  RIGHT ATRIUM            Index         +------------+---------++-----------++  RA Area:     27.40 cm                +------------+---------++-----------++  RA Volume:   89.70 ml   44.26 ml/m   +------------+---------++-----------++  +------------------+------------++ +---------------+--------++  AORTIC VALVE                       PULMONIC VALVE             +------------------+------------++ +---------------+--------++  AV Area (Vmax):    0.63 cm        PV Vmax:        0.87 m/s   +------------------+------------++ +---------------+--------++  AV Area (Vmean):   0.56 cm        PV Peak grad:   3.0 mmHg   +------------------+------------++ +---------------+--------++  AV Area (VTI):     0.69 cm        RVOT Peak grad: 2 mmHg     +------------------+------------++  +---------------+--------++  AV Vmax:           424.00 cm/s    +------------------+------------++  AV Vmean:          304.667 cm/s   +------------------+------------++  AV VTI:            0.839 m        +------------------+------------++  AV Peak Grad:      71.9 mmHg      +------------------+------------++  AV Mean Grad:      58.0 mmHg      +------------------+------------++  LVOT Vmax:         76.90 cm/s     +------------------+------------++  LVOT Vmean:        49.200 cm/s    +------------------+------------++  LVOT VTI:          0.168 m        +------------------+------------++  LVOT/AV VTI ratio: 0.20           +------------------+------------++   +-------------+-------++  AORTA                   +-------------+-------++  Ao Root diam: 2.90 cm   +-------------+-------++  +---------------+-----------++  TRICUSPID VALVE               +---------------+-----------++  TR Peak grad:   68.9 mmHg     +---------------+-----------++  TR Vmax:        415.00 cm/s   +---------------+-----------++   +--------------+-------+  SHUNTS                  +--------------+-------+  Systemic VTI:  0.17 m   +--------------+-------+  Systemic Diam: 2.10 cm  +--------------+-------+  +---------+-------+  IVC                +---------+-------+  IVC diam: 1.90 cm  +---------+-------+  STS RISK CALCULATOR: Isolated AVR Risk of Mortality: 2.025% Renal Failure: 3.222% Permanent Stroke: 1.434% Prolonged Ventilation: 6.995% DSW Infection: 0.142% Reoperation: 3.805% Morbidity or Mortality: 11.553% Short Length of Stay: 22.256% Long Length of Stay: 7.421%  ASSESSMENT AND PLAN: 85 year old gentleman with severe, stage D1 aortic stenosis with New York Heart Association functional class III symptoms of chronic diastolic heart failure.  I have reviewed the natural history of aortic stenosis with the patient and their family members who are present today. We have discussed the  limitations of medical therapy and the poor prognosis associated with symptomatic aortic stenosis. We have reviewed potential treatment options, including palliative medical therapy, conventional surgical aortic valve replacement, and transcatheter aortic valve replacement. We discussed treatment options in the context of the patient's specific comorbid medical conditions.    I have personally reviewed the patient's most recent echo study which demonstrates preserved LV systolic function with LVEF 55%, severe calcification and restriction of the aortic valve with peak and mean gradients of 72 and 58 mmHg, respectively.  The calculated aortic valve area 0.6 cm and the dimensionless index is 0.2.  Considering the patient's advanced age, physical limitation related to advanced arthritis, chronic kidney disease, and paroxysmal atrial fibrillation, TAVR would provide a lower morbidity treatment option than conventional surgery.  The patient would like to proceed with treatment in order to improve his quality of life and prevent further progression of symptomatic congestive heart failure.  He understands that next steps involve right and left heart catheterization.  Will arrange this to be performed in [facility] by Dr. Fletcher Simpson.  The patient will be given specific instructions regarding his medications in order to minimize his risk of contrast nephropathy in the setting of stage III chronic kidney disease.  I have reviewed the risks, indications, and alternatives to cardiac catheterization, possible angioplasty, and stenting with the patient. Risks include but are not limited to bleeding, infection, vascular injury, stroke, myocardial infection, arrhythmia, kidney injury, radiation-related injury in the case of prolonged fluoroscopy use, emergency cardiac surgery, and death. The patient understands the risks of serious complication is 1-2 in 9528 with diagnostic cardiac cath and 1-2%  or less with angioplasty/stenting.   Once his cardiac catheterization is completed, he will undergo CT angiography studies of the heart as well as the chest, abdomen, and pelvis.  Similar precautions will be taken to reduce the risk of contrast-induced nephropathy.  Following the studies, the patient will go for formal cardiac surgical consultation as part of a multidisciplinary approach to his care.  All of this is explained in detail to the patient and his daughter today.  We reviewed procedural animation of the TAVR procedure, as well as the expected risks and recovery period related to TAVR.  Gunnar Fusi  Donique Hammonds 07/27/2019 11:03 AM     [address] [place] [address]  [phone] (office) [phone] (fax)

## 2019-07-27 NOTE — Patient Instructions (Addendum)
Medication Instructions:  Your provider recommends that you continue on your current medications as directed. Please refer to the Current Medication list given to you today.    Labwork: TODAY: BMET, CBC  Testing/Procedures: Your physician has requested that you have a cardiac catheterization. Cardiac catheterization is used to diagnose and/or treat various heart conditions. Doctors may recommend this procedure for a number of different reasons. The most common reason is to evaluate chest pain. Chest pain can be a symptom of coronary artery disease (CAD), and cardiac catheterization can show whether plaque is narrowing or blocking your heart's arteries. This procedure is also used to evaluate the valves, as well as measure the blood flow and oxygen levels in different parts of your heart. For further information please visit [email or web address]. Please follow instruction sheet, as given. [place] office will   Follow-Up: We will contact you to arrange your CT scans.   CT SCAN INSTRUCTIONS: Your cardiac CT will be scheduled at one of the below locations:   [facility] [address] [place], [place] [address] (336) [place] [address] [place], [place] [address] [phone]  If scheduled at [facility], please arrive at the [facility] main entrance of [facility] 30-45 minutes prior to test start time. Proceed to the [facility] Radiology Department (first floor) to check-in and test prep.  Please follow these instructions carefully (unless otherwise directed):  Hold all erectile dysfunction medications at least 48 hours prior to test.  On the Night Before the Test: . Be sure to Drink plenty of water. . Do not consume any caffeinated/decaffeinated beverages or chocolate 12 hours prior to your test. . Do not take any antihistamines 12 hours prior to your test.  On the Day of the Test: . Drink  plenty of water. Do not drink any water within one hour of the test. . Do not eat any food 4 hours prior to the test. . You may take your regular medications prior to the test (except below).  . Take your metoprolol two hours prior to test. . HOLD Furosemide morning of the test.  After the Test: . Drink plenty of water. . After receiving IV contrast, you may experience a mild flushed feeling. This is normal. . On occasion, you may experience a mild rash up to 24 hours after the test. This is not dangerous. If this occurs, you can take Benadryl 25 mg and increase your fluid intake. . If you experience trouble breathing, this can be serious. If it is severe call 911 IMMEDIATELY. If it is mild, please call our office. . If you take any of these medications: Glipizide/Metformin, Avandament, Glucavance, please do not take 48 hours after completing test.  Please contact the cardiac imaging nurse navigator should you have any questions/concerns Marchia Bond, RN Navigator Cardiac [place] [facility] [phone] Office  [phone] Cell

## 2019-07-30 ENCOUNTER — Telehealth: Payer: Self-pay

## 2019-07-30 DIAGNOSIS — I350 Nonrheumatic aortic (valve) stenosis: Secondary | ICD-10-CM

## 2019-07-30 NOTE — Telephone Encounter (Signed)
DPR on file. lmtcb to discuss scheduling the patient's R & L Heart cath for 08/10/19.

## 2019-07-30 NOTE — Telephone Encounter (Signed)
-----   Message from Wellington Hampshire, MD sent at 07/30/2019  1:23 PM EDT ----- Will do . Thanks Ronalee Belts.   Lattie Haw, Please schedule the patient for a right and left cardiac catheterization at [facility].  Probably best on August 24.  He needs 4 hours of hydration due to chronic kidney disease. ----- Message ----- From: Sherren Mocha, MD Sent: 07/29/2019   5:00 PM EDT To: Wellington Hampshire, MD  Mickey Farber - thanks for referring Mr Wogan. He would like to have his cath in [place] with you if that's possible. His daughter is a retired nephrology nurse and as you know he has Stage 3 CKD. I told him you would arrange for him to have 3-4 hours of fluids prior to cath and I thought that would be sufficient for a limited dye coronary angio. We'll set up all of the other pre-TAVR testing.   Ronalee Belts

## 2019-07-31 NOTE — Telephone Encounter (Signed)
Daughter calling and spoke with sister Has decided to stay with August 24th but will need a call with specifics

## 2019-07-31 NOTE — Telephone Encounter (Signed)
Returned [employer]. lmtcb. Asked her to provide the update so it can be included in the message when she calls back.

## 2019-07-31 NOTE — Telephone Encounter (Signed)
Spoke with the patient's daughter [name] who is his healthcare POA. Her sister will be out of town on 08/10/19 and they would like an alternative date. Georgiann Mohs that Dr.Arida will be at [facility] on 08/17/19, I will need to talk with him to make sure he is available to do the procedure on that date. Georgiann Mohs I will fwd the update to [place] and call back with an alternative date for the procedure.

## 2019-07-31 NOTE — Telephone Encounter (Signed)
Patient daughter calling back with updated scheduling information for cath

## 2019-07-31 NOTE — Telephone Encounter (Signed)
Spoke with the patient's daughter [name]. Adv her that I received the message and that I will work on getting it the procedure scheduled for 08/10/19. Marjie Skiff that I will be in clinic this afternoon. I will call her on Mon 08/03/19 to go over pre-cath instructions and testing.  Patients R &L heart cath scheduled on 08/10/19 @ 10:30am with Dr.Arida. patient to arrive at the medical mall 4 hours prior for hydration.

## 2019-08-03 ENCOUNTER — Other Ambulatory Visit: Payer: Self-pay

## 2019-08-03 DIAGNOSIS — R300 Dysuria: Secondary | ICD-10-CM

## 2019-08-03 NOTE — Telephone Encounter (Addendum)
Spoke with the patient's daughter Jacob Simpson. Verbal pre-procedure instructions given over the phone. Written instructions sent through mychart. Jacob Simpson verbalized understanding to the instructions given.      [facility] Cardiac Cath Instructions   You are scheduled for a Cardiac Cath on:___8/24/20_@10 :30am____  Please arrive at __6:30am_____am on the day of your procedure  Please expect a call from our [place] to pre-register you  Do not eat/drink anything after midnight  Someone will need to drive you home  It is recommended someone be with you for the first 24 hours after your procedure  Wear clothes that are easy to get on/off and wear slip on shoes if possible   Medications bring a current list of all medications with you  _X__ You may take all of your medications the morning of your procedure with enough water to swallow safely  _X_ Do not take these medications before your procedure:_Furosemide_the morning of________________________________________________________________________________________________Do not take Eliquis 48 hours prior (last dose of Eliquis should be the evening of 08/07/19) _________________________________________________________________________________________________  [name] will need lab work prior to the procedure (Bmet, Cbc) please have your labs drawn at the medical mall. No appointment is needed. Ok to have them drawn on 08/05/19.  Please have your COVID testing completed on 08/06/19 hours are (8:30am-3:30pm) at the [facility] medical arts drive up testing site.  Day of your procedure: Arrive at the [facility] entrance.  Free valet service is available.  After entering the [place] please check-in at the registration desk (1st desk on your right) to receive your armband. After receiving your armband someone will escort you to the cardiac cath/special procedures waiting area.  The usual length of stay after your procedure is about 2 to 3  hours.  This can vary.  If you have any questions, please call our office at [phone], or you may call the cardiac cath lab at [facility] directly at [phone]

## 2019-08-04 ENCOUNTER — Other Ambulatory Visit
Admission: RE | Admit: 2019-08-04 | Discharge: 2019-08-04 | Disposition: A | Discharge status: Home / Self Care | Payer: Medicare Other | Attending: Urology | Admitting: Urology

## 2019-08-04 ENCOUNTER — Encounter: Payer: Self-pay | Admitting: Urology

## 2019-08-04 ENCOUNTER — Other Ambulatory Visit: Payer: Self-pay

## 2019-08-04 ENCOUNTER — Ambulatory Visit (INDEPENDENT_AMBULATORY_CARE_PROVIDER_SITE_OTHER): Payer: Medicare Other | Admitting: Urology

## 2019-08-04 VITALS — BP 138/86 | HR 89 | Ht 68.0 in | Wt 182.0 lb

## 2019-08-04 DIAGNOSIS — N183 Chronic kidney disease, stage 3 unspecified: Secondary | ICD-10-CM

## 2019-08-04 DIAGNOSIS — N401 Enlarged prostate with lower urinary tract symptoms: Secondary | ICD-10-CM | POA: Diagnosis not present

## 2019-08-04 DIAGNOSIS — R300 Dysuria: Secondary | ICD-10-CM | POA: Diagnosis present

## 2019-08-04 DIAGNOSIS — N138 Other obstructive and reflux uropathy: Secondary | ICD-10-CM

## 2019-08-04 LAB — URINALYSIS, COMPLETE (UACMP) WITH MICROSCOPIC
Bacteria, UA: NONE SEEN
Bilirubin Urine: NEGATIVE
Glucose, UA: NEGATIVE mg/dL
Hgb urine dipstick: NEGATIVE
Ketones, ur: NEGATIVE mg/dL
Leukocytes,Ua: NEGATIVE
Nitrite: NEGATIVE
Protein, ur: NEGATIVE mg/dL
RBC / HPF: NONE SEEN RBC/hpf (ref 0–5)
Specific Gravity, Urine: 1.015 (ref 1.005–1.030)
Squamous Epithelial / HPF: NONE SEEN (ref 0–5)
pH: 6.5 (ref 5.0–8.0)

## 2019-08-04 MED ORDER — TAMSULOSIN HCL 0.4 MG PO CAPS: 0.4000 mg | ORAL_CAPSULE | Freq: Every day | ORAL | 0 refills | Status: DC

## 2019-08-04 MED ORDER — TAMSULOSIN HCL 0.4 MG PO CAPS: 0.4000 mg | ORAL_CAPSULE | Freq: Every day | ORAL | 3 refills | Status: DC

## 2019-08-04 NOTE — Patient Instructions (Signed)

## 2019-08-04 NOTE — Progress Notes (Signed)
Patient ID: CADEL STAIRS, male    DOB: [birth year], 85 y.o.   MRN: [id number]  HPI  Mr Chaddock is a 85 y/o male with a history of HTN, severe aortic stenosis, TIA, previous tobacco use and chronic heart failure.   Echo report from 07/06/2019 reviewed and showed an EF of 55-60% along with moderate MR, mild/moderate TR, severe AS and an elevated PA pressure of 71.9 mmHg. Echo report from 02/25/2019 reviewed and showed an EF of 60-65% along with moderate MR and severe AS.   Admitted 07/05/2019 due to acute kidney injury secondary to ATN and hypotension. Cardiology consult obtained. Given gentle IV hydration. Discharged after 3 days. Admitted 02/24/2019 due to acute on chronic HF. Initially needed bipap and then transitioned to room air. Finished antibiotics due to pneumonia. Discharged after 5 days.   He presents today for a follow-up visit with a chief complaint of moderate shortness of breath upon minimal exertion. He describes this as chronic in nature having been present for several years. He has associated cough, leg weakness, palpitations, pedal edema and right knee pain along with this. He denies any difficulty sleeping, abdominal distention, chest pain, dizziness, fatigue or weight gain. Patient reports not getting tired as he "doesn't do anything" but his daughter says that he does get tired.   Has catheterization scheduled next week with pending TAVR in the future.   Past Medical History:  Diagnosis Date  . A-fib ([place])   . Aortic aneurysm ([place])   . Aortic stenosis   . Asthma   . CHF (congestive heart failure) ([place])   . Hammertoe   . Heart murmur   . HTN (hypertension)   . TIA (transient ischemic attack)    Past Surgical History:  Procedure Laterality Date  . BACK SURGERY  12/89  . CATARACT EXTRACTION W/ INTRAOCULAR LENS IMPLANT Right 12/00  . CATARACT EXTRACTION W/ INTRAOCULAR LENS IMPLANT Left 8/01  . foot sugery Right 6/03  . FOOT SURGERY Left 1/96   hammertoe  . KIDNEY STONE  SURGERY  7/90  . ROTATOR CUFF REPAIR Right 1/98  . TOTAL KNEE ARTHROPLASTY Bilateral 1987   Family History  Problem Relation Age of Onset  . Heart disease Father    Social History   Tobacco Use  . Smoking status: Former Research scientist (life sciences)  . Smokeless tobacco: Never Used  . Tobacco comment: quit 1989  Substance Use Topics  . Alcohol use: No   Allergies  Allergen Reactions  . Penicillins Rash    Did it involve swelling of the face/tongue/throat, SOB, or low BP? Unknown Did it involve sudden or severe rash/hives, skin peeling, or any reaction on the inside of your mouth or nose? Unknown Did you need to seek medical attention at a hospital or doctor's office? Unknown When did it last happen?Unknown If all above answers are "NO", may proceed with cephalosporin use.    Prior to Admission medications   Medication Sig Start Date End Date Taking? Authorizing Provider  acetaminophen (TYLENOL) 500 MG tablet Take 1,000 mg by mouth every 6 (six) hours as needed for mild pain, moderate pain or fever.   Yes [provider]  apixaban (ELIQUIS) 2.5 MG TABS tablet Take 1 tablet (2.5 mg total) by mouth 2 (two) times daily. 06/25/19  Yes Wellington [place], MD  atorvastatin (LIPITOR) 40 MG tablet Take 40 mg by mouth daily.   Yes [provider]  azelastine (ASTELIN) 0.1 % nasal spray Place 1 spray into both nostrils 2 (  two) times daily as needed for rhinitis or allergies. Use in each nostril as directed   Yes [provider]  cetirizine (ZYRTEC) 10 MG tablet Take 10 mg by mouth daily.    Yes [provider]  famotidine (PEPCID) 10 MG tablet Take 10 mg by mouth daily as needed for heartburn or indigestion.   Yes [provider]  fluticasone (FLOVENT HFA) 110 MCG/ACT inhaler Inhale 1 puff into the lungs 2 (two) times daily as needed (respiratory symptoms).    Yes [provider]  furosemide (LASIX) 20 MG tablet Take 1 tablet (20 mg total) by mouth daily.  07/08/19  Yes Wieting, Richard, MD  gabapentin (NEURONTIN) 100 MG capsule Take 1 capsule (100 mg total) by mouth 3 (three) times daily as needed (back pain). Patient taking differently: Take 200 mg by mouth 3 (three) times daily.  07/07/19  Yes Epifanio Lesches, MD  guaiFENesin (MUCINEX) 600 MG 12 hr tablet Take 600 mg by mouth 2 (two) times daily as needed for cough or to loosen phlegm.   Yes [provider]  Ipratropium-Albuterol (COMBIVENT) 20-100 MCG/ACT AERS respimat Inhale 1 puff into the lungs every 6 (six) hours. Patient taking differently: Inhale 1 puff into the lungs every 6 (six) hours as needed for wheezing or shortness of breath.  07/07/19  Yes Epifanio Lesches, MD  lactulose (CHRONULAC) 10 GM/15ML solution Take 20 g by mouth daily as needed for moderate constipation.  04/14/19  Yes [provider]  meclizine (ANTIVERT) 12.5 MG tablet Take 12.5 mg by mouth 3 (three) times daily as needed for dizziness.   Yes [provider]  metoprolol succinate (TOPROL-XL) 50 MG 24 hr tablet Take 1 tablet (50 mg total) by mouth daily. Take with or immediately following a meal. 07/08/19  Yes Wieting, Richard, MD  mirtazapine (REMERON) 7.5 MG tablet Take 7.5 mg by mouth at bedtime.   Yes [provider]  omeprazole (PRILOSEC) 20 MG capsule Take 20 mg by mouth daily as needed (stomach acid).   Yes [provider]  ondansetron (ZOFRAN-ODT) 8 MG disintegrating tablet Take 8 mg by mouth every 8 (eight) hours as needed for nausea or vomiting.   Yes [provider]  polyethylene glycol (MIRALAX) 17 g packet Take 17 g by mouth daily as needed for moderate constipation. 07/08/19  Yes Wieting, Richard, MD  tamsulosin (FLOMAX) 0.4 MG CAPS capsule Take 1 capsule (0.4 mg total) by mouth daily after supper. 08/04/19  Yes Billey Co, MD    Review of Systems  Constitutional: Negative for appetite change and fatigue.  HENT: Positive for hearing loss. Negative  for congestion, rhinorrhea and sore throat.   Eyes: Negative.   Respiratory: Positive for cough (productive in the morning) and shortness of breath (with little exertion).   Cardiovascular: Positive for palpitations and leg swelling. Negative for chest pain.  Gastrointestinal: Negative for abdominal distention and abdominal pain.  Endocrine: Negative.   Genitourinary: Negative.   Musculoskeletal: Positive for arthralgias (right knee).  Skin: Negative.   Allergic/Immunologic: Negative.   Neurological: Positive for weakness (right leg). Negative for dizziness and light-headedness.  Hematological: Negative for adenopathy. Does not bruise/bleed easily.  Psychiatric/Behavioral: Negative for dysphoric mood and sleep disturbance (sleeping on 2 pillows). The patient is not nervous/anxious.    Vitals:   08/05/19 1403  BP: (!) 137/93  Pulse: 88  Resp: 18  SpO2: 98%  Weight: 189 lb 6 oz (85.9 kg)  Height: 5\' 9"  (1.753 m)   Wt  Readings from Last 3 Encounters:  08/05/19 189 lb 6 oz (85.9 kg)  08/04/19 182 lb (82.6 kg)  07/27/19 191 lb (86.6 kg)   Lab Results  Component Value Date   CREATININE 1.19 08/05/2019   CREATININE 1.25 07/27/2019   CREATININE 1.46 (H) 07/07/2019   Physical Exam Vitals signs and nursing note reviewed.  Constitutional:      Appearance: Normal appearance.  HENT:     Head: Normocephalic and atraumatic.     Right Ear: Decreased hearing noted.     Left Ear: Decreased hearing noted.  Neck:     Musculoskeletal: Normal range of motion and neck supple.  Cardiovascular:     Rate and Rhythm: Normal rate and regular rhythm.     Heart sounds: Murmur present.  Pulmonary:     Breath sounds: Examination of the right-lower field reveals rales. Rales (lower lobes) present. No wheezing.  Abdominal:     General: There is no distension.     Palpations: Abdomen is soft.  Musculoskeletal:        General: No tenderness.     Right lower leg: Edema (1+ pitting) present.      Left lower leg: Edema (2+ pitting) present.  Skin:    General: Skin is warm and dry.  Neurological:     General: No focal deficit present.     Mental Status: He is alert and oriented to person, place, and time.  Psychiatric:        Mood and Affect: Mood normal.        Thought Content: Thought content normal.     Assessment & Plan:  1: Chronic heart failure with preserved ejection fraction- - NYHA class III - euvolemic today - weighing daily; reminded to call for an overnight weight gain of >2 pounds or a weekly weight gain of >5 pounds - weight up 4 pounds from last visit 5 months ago - since patient has rales in lower lobes, will have him take an additional dose of diuretic both today and tomorrow - he's getting labs and COVID testing done today - not adding salt and the daytime caregiver doesn't cook with salt either  - BNP 02/25/2019 was 873.0 - Pro-BNP 03/09/2019 was elevated at 2403 - has [facility] PT coming in  2: HTN- - BP mildly elevated today - saw PCP (Behling) 07/21/2019 - BMP from 07/27/2019 reviewed and showed sodium 142, potassium 4.1, creatinine 1.25 and GFR 51  3: Severe aortic stenosis- - saw cardiology Burt Knack) 07/27/2019 - scheduled for catheterization 08/10/2019 - plan for TAVR in the future   Medication list was reviewed.  Return in 2 months or sooner for any questions/problems before then.

## 2019-08-04 NOTE — Progress Notes (Signed)
08/04/19 10:57 AM   Jacob Simpson 21-Jan-1930 [id number]  Referring provider: [place], [place],   [id number]  CC: Weak stream  HPI: I saw Jacob Simpson in urology clinic today in consultation from Dr. Kym Groom for BPH with weak urinary stream.  He is an 85 year old extremely comorbid male with history of A. fib, aortic aneurysm, severe aortic stenosis, CHF, and TIA, and anticoagulated with Eliquis.  He also takes Lasix 20 mg daily.  He was recently hospitalized for acute on chronic renal failure with hypotension, likely secondary to dehydration.  Renal ultrasound at that time was benign with no hydronephrosis.  He was started on Flomax by the hospitalist at that time, which he feels has improved his strength of stream.  There are no aggravating factors.  He drinks primarily water during the day.  He denies any nocturia.  Overall, for his age and with his co-morbidities he really does not have significant urinary symptoms.  He denies any dysuria, gross hematuria, history of UTIs, or history of urinary retention.   PVR in clinic today is 0 mL's.  Urinalysis is completely benign with 0 RBCs, 0 WBCs, no bacteria, nitrite negative.   PMH: Past Medical History:  Diagnosis Date  . A-fib ([place])   . Aortic aneurysm ([place])   . Aortic stenosis   . Asthma   . CHF (congestive heart failure) ([place])   . Hammertoe   . Heart murmur   . HTN (hypertension)   . TIA (transient ischemic attack)     Surgical History: Past Surgical History:  Procedure Laterality Date  . BACK SURGERY  12/89  . CATARACT EXTRACTION W/ INTRAOCULAR LENS IMPLANT Right 12/00  . CATARACT EXTRACTION W/ INTRAOCULAR LENS IMPLANT Left 8/01  . foot sugery Right 6/03  . FOOT SURGERY Left 1/96   hammertoe  . KIDNEY STONE SURGERY  7/90  . ROTATOR CUFF REPAIR Right 1/98  . TOTAL KNEE ARTHROPLASTY Bilateral 1987    Allergies:  Allergies  Allergen Reactions  . Penicillins Rash    Did it  involve swelling of the face/tongue/throat, SOB, or low BP? Unknown Did it involve sudden or severe rash/hives, skin peeling, or any reaction on the inside of your mouth or nose? Unknown Did you need to seek medical attention at a hospital or doctor's office? Unknown When did it last happen?Unknown If all above answers are "NO", may proceed with cephalosporin use.     Family History: Family History  Problem Relation Age of Onset  . Heart disease Father     Social History:  reports that he has quit smoking. He has never used smokeless tobacco. He reports that he does not drink alcohol or use drugs.  ROS: Please see flowsheet from today's date for complete review of systems.  Physical Exam: BP 138/86   Pulse 89   Ht 5\' 8"  (1.727 m)   Wt 182 lb (82.6 kg)   BMI 27.67 kg/m    Constitutional: Hard of hearing, pleasantly confused but conversational Cardiovascular: No clubbing, cyanosis, or edema. Respiratory: Normal respiratory effort, no increased work of breathing. GI: Abdomen is soft, nontender, nondistended, no abdominal masses Lymph: No cervical or inguinal lymphadenopathy. Skin: No rashes, bruises or suspicious lesions. Neurologic: Grossly intact, no focal deficits, moving all 4 extremities. Psychiatric: Normal mood and affect.  Laboratory Data: Reviewed  Assessment & Plan:   In summary, the patient is an 85 year old extremely comorbid male with history of A. fib, CHF, CKD, severe  aortic stenosis, TIA, anticoagulated with Eliquis with very mild BPH and urinary symptoms of weak stream.  His PVR is 0 mL's today.  His urinary symptoms have significantly improved on Flomax started 1 month ago.  Recent renal ultrasound shows no hydronephrosis.  I had a long conversation with the patient and his daughter about his urinary symptoms in the setting of his numerous co-morbidities.  With his mild symptoms, I would not recommend the addition of finasteride at this time, and he  would certainly not be a candidate for any outlet procedure.  Flomax was refilled.  Continue Flomax Follow-up with PCP for yearly Flomax refills, follow-up with urology as needed   Billey Co, [place] [address], [place], [place] [address] [phone]

## 2019-08-05 ENCOUNTER — Encounter: Payer: Self-pay | Admitting: Family

## 2019-08-05 ENCOUNTER — Other Ambulatory Visit
Admission: RE | Admit: 2019-08-05 | Discharge: 2019-08-05 | Disposition: A | Discharge status: Home / Self Care | Payer: Medicare Other | Source: Ambulatory Visit | Attending: Cardiovascular Disease | Admitting: Cardiovascular Disease

## 2019-08-05 ENCOUNTER — Ambulatory Visit: Payer: Medicare Other | Admitting: Family

## 2019-08-05 VITALS — BP 137/93 | HR 88 | Resp 18 | Ht 69.0 in | Wt 189.4 lb

## 2019-08-05 DIAGNOSIS — I4891 Unspecified atrial fibrillation: Secondary | ICD-10-CM | POA: Insufficient documentation

## 2019-08-05 DIAGNOSIS — I5032 Chronic diastolic (congestive) heart failure: Secondary | ICD-10-CM | POA: Insufficient documentation

## 2019-08-05 DIAGNOSIS — Z9841 Cataract extraction status, right eye: Secondary | ICD-10-CM | POA: Diagnosis not present

## 2019-08-05 DIAGNOSIS — I10 Essential (primary) hypertension: Secondary | ICD-10-CM

## 2019-08-05 DIAGNOSIS — I110 Hypertensive heart disease with heart failure: Secondary | ICD-10-CM | POA: Insufficient documentation

## 2019-08-05 DIAGNOSIS — Z20828 Contact with and (suspected) exposure to other viral communicable diseases: Secondary | ICD-10-CM | POA: Diagnosis not present

## 2019-08-05 DIAGNOSIS — Z7951 Long term (current) use of inhaled steroids: Secondary | ICD-10-CM | POA: Insufficient documentation

## 2019-08-05 DIAGNOSIS — Z880 Allergy status to penicillin: Secondary | ICD-10-CM | POA: Diagnosis not present

## 2019-08-05 DIAGNOSIS — I350 Nonrheumatic aortic (valve) stenosis: Secondary | ICD-10-CM | POA: Diagnosis not present

## 2019-08-05 DIAGNOSIS — Z01818 Encounter for other preprocedural examination: Secondary | ICD-10-CM | POA: Insufficient documentation

## 2019-08-05 DIAGNOSIS — Z8673 Personal history of transient ischemic attack (TIA), and cerebral infarction without residual deficits: Secondary | ICD-10-CM | POA: Diagnosis not present

## 2019-08-05 DIAGNOSIS — Z961 Presence of intraocular lens: Secondary | ICD-10-CM | POA: Insufficient documentation

## 2019-08-05 DIAGNOSIS — Z79899 Other long term (current) drug therapy: Secondary | ICD-10-CM | POA: Diagnosis not present

## 2019-08-05 DIAGNOSIS — Z7901 Long term (current) use of anticoagulants: Secondary | ICD-10-CM | POA: Insufficient documentation

## 2019-08-05 DIAGNOSIS — Z96653 Presence of artificial knee joint, bilateral: Secondary | ICD-10-CM | POA: Diagnosis not present

## 2019-08-05 DIAGNOSIS — Z87891 Personal history of nicotine dependence: Secondary | ICD-10-CM | POA: Diagnosis not present

## 2019-08-05 DIAGNOSIS — Z8249 Family history of ischemic heart disease and other diseases of the circulatory system: Secondary | ICD-10-CM | POA: Insufficient documentation

## 2019-08-05 DIAGNOSIS — Z9842 Cataract extraction status, left eye: Secondary | ICD-10-CM | POA: Insufficient documentation

## 2019-08-05 LAB — CBC WITH DIFFERENTIAL/PLATELET
Abs Immature Granulocytes: 0.03 10*3/uL (ref 0.00–0.07)
Basophils Absolute: 0.1 10*3/uL (ref 0.0–0.1)
Basophils Relative: 1 %
Eosinophils Absolute: 0.6 10*3/uL — ABNORMAL HIGH (ref 0.0–0.5)
Eosinophils Relative: 6 %
HCT: 39.5 % (ref 39.0–52.0)
Hemoglobin: 13 g/dL (ref 13.0–17.0)
Immature Granulocytes: 0 %
Lymphocytes Relative: 29 %
Lymphs Abs: 2.8 10*3/uL (ref 0.7–4.0)
MCH: 31 pg (ref 26.0–34.0)
MCHC: 32.9 g/dL (ref 30.0–36.0)
MCV: 94.3 fL (ref 80.0–100.0)
Monocytes Absolute: 0.9 10*3/uL (ref 0.1–1.0)
Monocytes Relative: 9 %
Neutro Abs: 5.5 10*3/uL (ref 1.7–7.7)
Neutrophils Relative %: 55 %
Platelets: 145 10*3/uL — ABNORMAL LOW (ref 150–400)
RBC: 4.19 MIL/uL — ABNORMAL LOW (ref 4.22–5.81)
RDW: 13.1 % (ref 11.5–15.5)
WBC: 9.9 10*3/uL (ref 4.0–10.5)
nRBC: 0 % (ref 0.0–0.2)

## 2019-08-05 LAB — BASIC METABOLIC PANEL
Anion gap: 7 (ref 5–15)
BUN: 20 mg/dL (ref 8–23)
CO2: 22 mmol/L (ref 22–32)
Calcium: 8.8 mg/dL — ABNORMAL LOW (ref 8.9–10.3)
Chloride: 108 mmol/L (ref 98–111)
Creatinine, Ser: 1.19 mg/dL (ref 0.61–1.24)
GFR calc Af Amer: >60 mL/min (ref >60)
GFR calc non Af Amer: 55 mL/min — ABNORMAL LOW (ref >60)
Glucose, Bld: 115 mg/dL — ABNORMAL HIGH (ref 70–99)
Potassium: 3.8 mmol/L (ref 3.5–5.1)
Sodium: 137 mmol/L (ref 135–145)

## 2019-08-05 NOTE — Patient Instructions (Signed)
Continue weighing daily and call for an overnight weight gain of > 2 pounds or a weekly weight gain of >5 pounds.  Take an extra fluid pill (lasix) tomorrow and Friday.

## 2019-08-06 ENCOUNTER — Other Ambulatory Visit: Admission: RE | Admit: 2019-08-06 | Payer: Medicare Other | Source: Ambulatory Visit

## 2019-08-06 LAB — SARS CORONAVIRUS 2 (TAT 6-24 HRS): SARS Coronavirus 2: NEGATIVE

## 2019-08-10 ENCOUNTER — Other Ambulatory Visit: Payer: Self-pay

## 2019-08-10 ENCOUNTER — Encounter
Admission: RE | Disposition: A | Discharge status: Home / Self Care | Payer: Self-pay | Source: Home / Self Care | Attending: Cardiovascular Disease

## 2019-08-10 ENCOUNTER — Ambulatory Visit
Admission: RE | Admit: 2019-08-10 | Discharge: 2019-08-10 | Disposition: A | Discharge status: Home / Self Care | Payer: Medicare Other | Attending: Cardiovascular Disease | Admitting: Cardiovascular Disease

## 2019-08-10 DIAGNOSIS — I130 Hypertensive heart and chronic kidney disease with heart failure and stage 1 through stage 4 chronic kidney disease, or unspecified chronic kidney disease: Secondary | ICD-10-CM | POA: Diagnosis not present

## 2019-08-10 DIAGNOSIS — R0602 Shortness of breath: Secondary | ICD-10-CM | POA: Diagnosis not present

## 2019-08-10 DIAGNOSIS — Z79899 Other long term (current) drug therapy: Secondary | ICD-10-CM | POA: Diagnosis not present

## 2019-08-10 DIAGNOSIS — I509 Heart failure, unspecified: Secondary | ICD-10-CM | POA: Insufficient documentation

## 2019-08-10 DIAGNOSIS — Z7951 Long term (current) use of inhaled steroids: Secondary | ICD-10-CM | POA: Diagnosis not present

## 2019-08-10 DIAGNOSIS — Z8249 Family history of ischemic heart disease and other diseases of the circulatory system: Secondary | ICD-10-CM | POA: Diagnosis not present

## 2019-08-10 DIAGNOSIS — N189 Chronic kidney disease, unspecified: Secondary | ICD-10-CM | POA: Diagnosis not present

## 2019-08-10 DIAGNOSIS — I2510 Atherosclerotic heart disease of native coronary artery without angina pectoris: Secondary | ICD-10-CM | POA: Diagnosis not present

## 2019-08-10 DIAGNOSIS — Z8673 Personal history of transient ischemic attack (TIA), and cerebral infarction without residual deficits: Secondary | ICD-10-CM | POA: Insufficient documentation

## 2019-08-10 DIAGNOSIS — Z96653 Presence of artificial knee joint, bilateral: Secondary | ICD-10-CM | POA: Diagnosis not present

## 2019-08-10 DIAGNOSIS — M1990 Unspecified osteoarthritis, unspecified site: Secondary | ICD-10-CM | POA: Insufficient documentation

## 2019-08-10 DIAGNOSIS — Z7901 Long term (current) use of anticoagulants: Secondary | ICD-10-CM | POA: Diagnosis not present

## 2019-08-10 DIAGNOSIS — I350 Nonrheumatic aortic (valve) stenosis: Secondary | ICD-10-CM

## 2019-08-10 DIAGNOSIS — Z880 Allergy status to penicillin: Secondary | ICD-10-CM | POA: Insufficient documentation

## 2019-08-10 DIAGNOSIS — J45909 Unspecified asthma, uncomplicated: Secondary | ICD-10-CM | POA: Insufficient documentation

## 2019-08-10 DIAGNOSIS — I480 Paroxysmal atrial fibrillation: Secondary | ICD-10-CM | POA: Insufficient documentation

## 2019-08-10 DIAGNOSIS — I272 Pulmonary hypertension, unspecified: Secondary | ICD-10-CM | POA: Diagnosis not present

## 2019-08-10 DIAGNOSIS — Z87891 Personal history of nicotine dependence: Secondary | ICD-10-CM | POA: Diagnosis not present

## 2019-08-10 HISTORY — PX: RIGHT/LEFT HEART CATH AND CORONARY ANGIOGRAPHY: CATH118266

## 2019-08-10 SURGERY — RIGHT/LEFT HEART CATH AND CORONARY ANGIOGRAPHY
Anesthesia: Moderate Sedation

## 2019-08-10 MED ORDER — ASPIRIN 81 MG PO CHEW: 81.0000 mg | CHEWABLE_TABLET | ORAL | Status: DC

## 2019-08-10 MED ORDER — ACETAMINOPHEN 325 MG PO TABS: 650.0000 mg | ORAL_TABLET | ORAL | Status: DC | PRN

## 2019-08-10 MED ORDER — SODIUM CHLORIDE 0.9 % IV SOLN
INTRAVENOUS | Status: DC
  Administered 2019-08-10: 07:00:00 via INTRAVENOUS

## 2019-08-10 MED ORDER — SODIUM CHLORIDE 0.9% FLUSH: 3.0000 mL | INTRAVENOUS | Status: DC | PRN

## 2019-08-10 MED ORDER — SODIUM CHLORIDE 0.9 % IV SOLN: 250.0000 mL | INTRAVENOUS | Status: DC | PRN

## 2019-08-10 MED ORDER — VERAPAMIL HCL 2.5 MG/ML IV SOLN
INTRAVENOUS | Status: AC
  Filled 2019-08-10: qty 2

## 2019-08-10 MED ORDER — FUROSEMIDE 40 MG PO TABS: 40.0000 mg | ORAL_TABLET | Freq: Every day | ORAL | 3 refills | Status: DC

## 2019-08-10 MED ORDER — SODIUM CHLORIDE 0.9% FLUSH: 3.0000 mL | Freq: Two times a day (BID) | INTRAVENOUS | Status: DC

## 2019-08-10 MED ORDER — IPRATROPIUM-ALBUTEROL 0.5-2.5 (3) MG/3ML IN SOLN
RESPIRATORY_TRACT | Status: AC
  Administered 2019-08-10: 3 mL via RESPIRATORY_TRACT
  Filled 2019-08-10: qty 3

## 2019-08-10 MED ORDER — HEPARIN SODIUM (PORCINE) 1000 UNIT/ML IJ SOLN
INTRAMUSCULAR | Status: DC | PRN
  Administered 2019-08-10: 4000 [IU] via INTRAVENOUS

## 2019-08-10 MED ORDER — MIDAZOLAM HCL 2 MG/2ML IJ SOLN
INTRAMUSCULAR | Status: AC
  Filled 2019-08-10: qty 2

## 2019-08-10 MED ORDER — FUROSEMIDE 10 MG/ML IJ SOLN
40.0000 mg | Freq: Once | INTRAMUSCULAR | Status: AC
  Administered 2019-08-10: 40 mg via INTRAVENOUS

## 2019-08-10 MED ORDER — HEPARIN SODIUM (PORCINE) 1000 UNIT/ML IJ SOLN
INTRAMUSCULAR | Status: AC
  Filled 2019-08-10: qty 1

## 2019-08-10 MED ORDER — VERAPAMIL HCL 2.5 MG/ML IV SOLN
INTRAVENOUS | Status: DC | PRN
  Administered 2019-08-10: 2.5 mg via INTRA_ARTERIAL

## 2019-08-10 MED ORDER — FENTANYL CITRATE (PF) 100 MCG/2ML IJ SOLN
INTRAMUSCULAR | Status: AC
  Filled 2019-08-10: qty 2

## 2019-08-10 MED ORDER — HEPARIN (PORCINE) IN NACL 1000-0.9 UT/500ML-% IV SOLN
INTRAVENOUS | Status: AC
  Filled 2019-08-10: qty 1000

## 2019-08-10 MED ORDER — IOHEXOL 300 MG/ML  SOLN
INTRAMUSCULAR | Status: DC | PRN
  Administered 2019-08-10: 12:00:00 70 mL via INTRA_ARTERIAL

## 2019-08-10 MED ORDER — HEPARIN (PORCINE) IN NACL 1000-0.9 UT/500ML-% IV SOLN
INTRAVENOUS | Status: DC | PRN
  Administered 2019-08-10: 500 mL

## 2019-08-10 MED ORDER — IPRATROPIUM-ALBUTEROL 0.5-2.5 (3) MG/3ML IN SOLN
3.0000 mL | Freq: Four times a day (QID) | RESPIRATORY_TRACT | Status: DC
  Administered 2019-08-10: 3 mL via RESPIRATORY_TRACT

## 2019-08-10 MED ORDER — FUROSEMIDE 10 MG/ML IJ SOLN
INTRAMUSCULAR | Status: AC
  Administered 2019-08-10: 12:00:00 40 mg via INTRAVENOUS
  Filled 2019-08-10: qty 4

## 2019-08-10 MED ORDER — ONDANSETRON HCL 4 MG/2ML IJ SOLN: 4.0000 mg | Freq: Four times a day (QID) | INTRAMUSCULAR | Status: DC | PRN

## 2019-08-10 SURGICAL SUPPLY — 12 items
CATH BALLN WEDGE 5F 110CM (CATHETERS) ×2 IMPLANT
CATH INFINITI 5FR JK (CATHETERS) ×2 IMPLANT
CATH INFINITI JR4 5F (CATHETERS) ×2 IMPLANT
DEVICE RAD TR BAND REGULAR (VASCULAR PRODUCTS) ×2 IMPLANT
GLIDESHEATH SLEND SS 6F .021 (SHEATH) ×2 IMPLANT
GUIDEWIRE .025 260CM (WIRE) ×2 IMPLANT
KIT MANI 3VAL PERCEP (MISCELLANEOUS) ×3 IMPLANT
KIT RIGHT HEART (MISCELLANEOUS) ×3 IMPLANT
PACK CARDIAC CATH (CUSTOM PROCEDURE TRAY) ×3 IMPLANT
SHEATH GLIDE SLENDER 4/5FR (SHEATH) ×2 IMPLANT
WIRE HITORQ VERSACORE ST 145CM (WIRE) ×2 IMPLANT
WIRE ROSEN-J .035X260CM (WIRE) ×2 IMPLANT

## 2019-08-10 NOTE — Interval H&P Note (Signed)
History and Physical Interval Note:  08/10/2019 10:49 AM  Jacob Simpson  has presented today for surgery, with the diagnosis of RT LT Heart Cath   Severe aortic stenosis   4 hrs hydration prior to procedure  Arrival at 630a.  The various methods of treatment have been discussed with the patient and family. After consideration of risks, benefits and other options for treatment, the patient has consented to  Procedure(s): RIGHT/LEFT HEART CATH AND CORONARY ANGIOGRAPHY (N/A) as a surgical intervention.  The patient's history has been reviewed, patient examined, no change in status, stable for surgery.  I have reviewed the patient's chart and labs.  Questions were answered to the patient's satisfaction.     [name]

## 2019-08-10 NOTE — Progress Notes (Signed)
Pt. Received from cath lab at 12:05. Pt. Very SOB, states "I am having a hard time breathing. " Lungs ant. And post. Full of Rhonchi throughout. Dr. Fletcher Anon texted: : new orders received. Pt. Med. With Lasix 40 mg IV and Resp. Treatment given now. Pt. Tolerating treatment well.

## 2019-08-10 NOTE — Progress Notes (Signed)
Pt. Sounds clear throughout now. Pt. Laughing, stating " I do feel a lot better now."

## 2019-08-11 ENCOUNTER — Encounter: Payer: Self-pay | Admitting: Cardiovascular Disease

## 2019-08-19 ENCOUNTER — Other Ambulatory Visit: Payer: Self-pay

## 2019-08-19 DIAGNOSIS — I350 Nonrheumatic aortic (valve) stenosis: Secondary | ICD-10-CM

## 2019-08-19 DIAGNOSIS — N289 Disorder of kidney and ureter, unspecified: Secondary | ICD-10-CM

## 2019-08-20 ENCOUNTER — Encounter: Payer: Self-pay | Admitting: Surgery

## 2019-08-27 ENCOUNTER — Ambulatory Visit (HOSPITAL_COMMUNITY): Admission: RE | Admit: 2019-08-27 | Payer: Medicare Other | Source: Ambulatory Visit

## 2019-08-27 ENCOUNTER — Ambulatory Visit (HOSPITAL_COMMUNITY)
Admission: RE | Admit: 2019-08-27 | Discharge: 2019-08-27 | Disposition: A | Discharge status: Home / Self Care | Payer: Medicare Other | Source: Ambulatory Visit | Attending: Cardiovascular Disease | Admitting: Cardiovascular Disease

## 2019-08-27 ENCOUNTER — Other Ambulatory Visit: Payer: Self-pay

## 2019-08-27 ENCOUNTER — Ambulatory Visit (HOSPITAL_BASED_OUTPATIENT_CLINIC_OR_DEPARTMENT_OTHER)
Admission: RE | Admit: 2019-08-27 | Discharge: 2019-08-27 | Disposition: A | Discharge status: Home / Self Care | Payer: Medicare Other | Source: Ambulatory Visit | Attending: Cardiovascular Disease | Admitting: Cardiovascular Disease

## 2019-08-27 DIAGNOSIS — I350 Nonrheumatic aortic (valve) stenosis: Secondary | ICD-10-CM

## 2019-08-27 DIAGNOSIS — N289 Disorder of kidney and ureter, unspecified: Secondary | ICD-10-CM

## 2019-08-27 IMAGING — CT CT ANGIO CHEST
2 of 16 series · 16 of 37 positions shown · IV contrast (APPLIED)
Comparison: No priors.

CLINICAL DATA: 89-year-old male with history of aortic stenosis.
Preprocedural study prior to potential transcatheter aortic valve
replacement (TAVR) procedure.

EXAM:
CT ANGIOGRAPHY CHEST, ABDOMEN AND PELVIS
TECHNIQUE: Multidetector CT imaging through the chest, abdomen and pelvis was
performed using the standard protocol during bolus administration of
intravenous contrast. Multiplanar reconstructed images and MIPs were
obtained and reviewed to evaluate the vascular anatomy.
CONTRAST:  100mL OMNIPAQUE IOHEXOL 350 MG/ML SOLN

[Series 9: 0-90% · axial · 0.39mm/px · z∈[-80,+99]mm · 8 of 3840 slices shown]
[im 427/3840  lung]
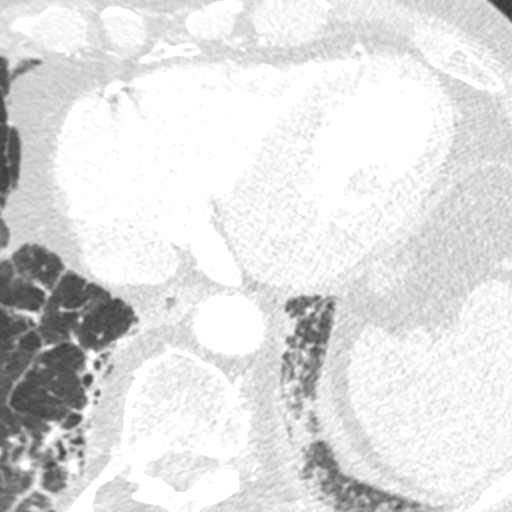
[im 854/3840  mediastinal]
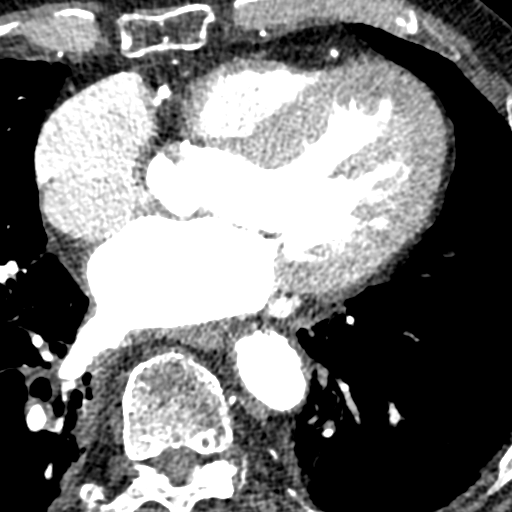
[im 1280/3840  lung]
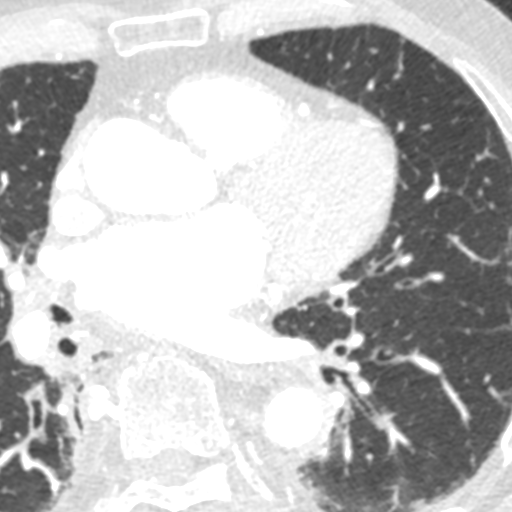
[im 1707/3840  mediastinal]
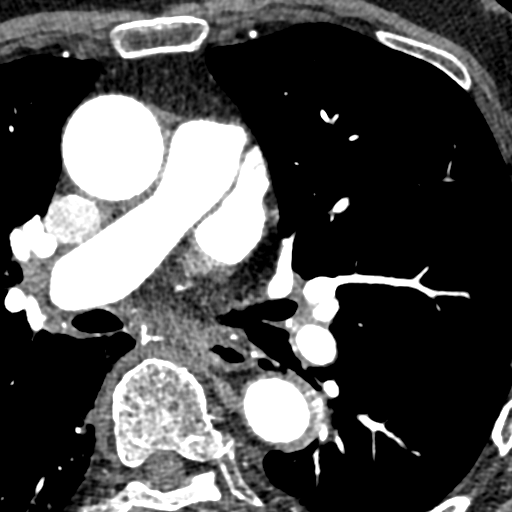
[im 2133/3840  lung]
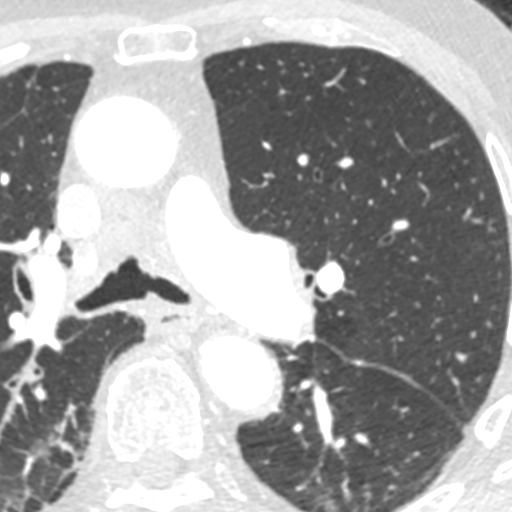
[im 2560/3840  mediastinal]
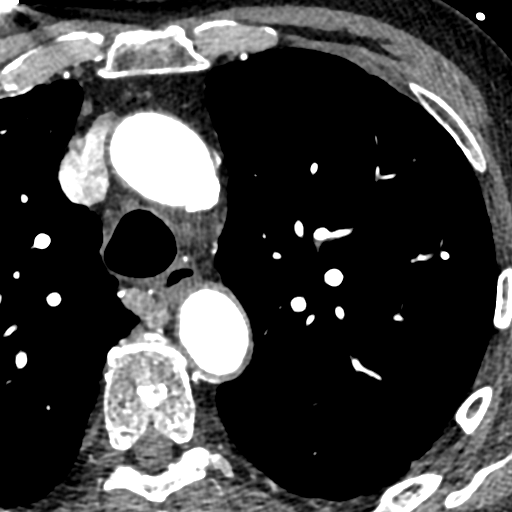
[im 2986/3840  lung]
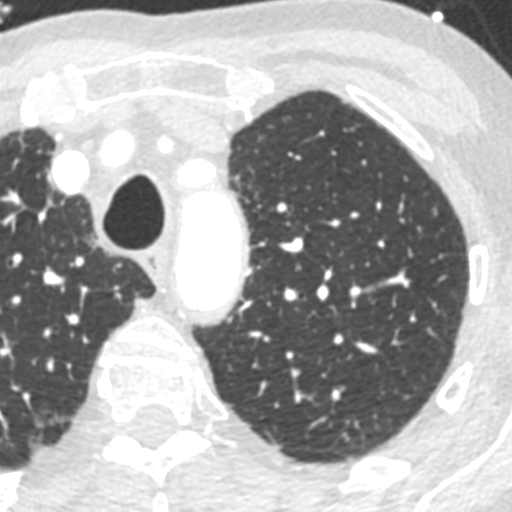
[im 3413/3840  mediastinal]
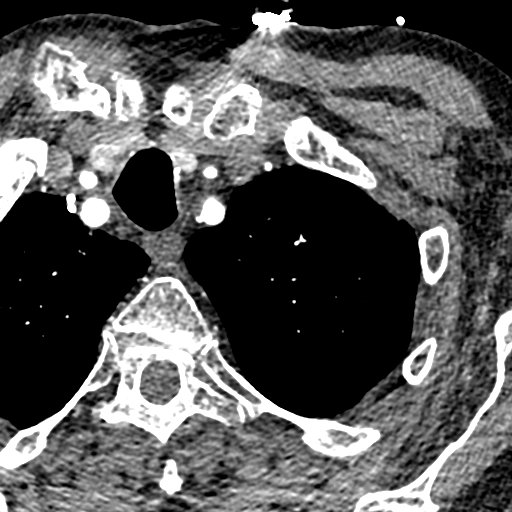

[Series 10: 5-95% · axial · 0.39mm/px · z∈[-80,+99]mm · 8 of 3840 slices shown]
[im 427/3840  lung]
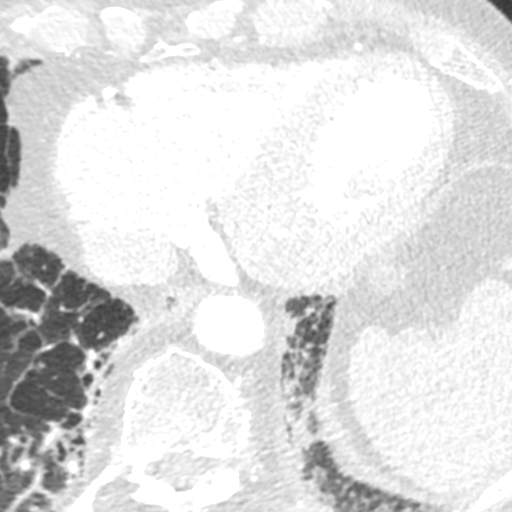
[im 854/3840  lung]
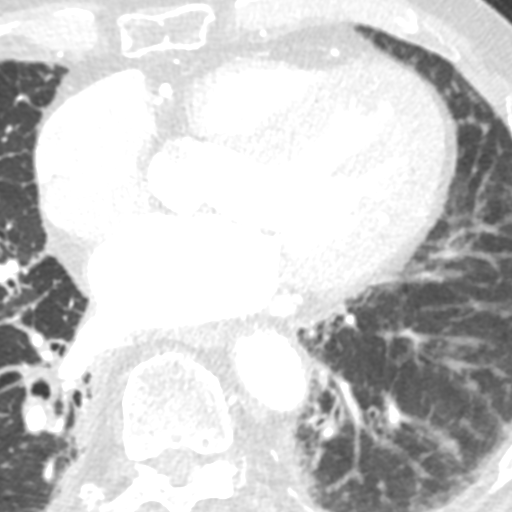
[im 1280/3840  lung]
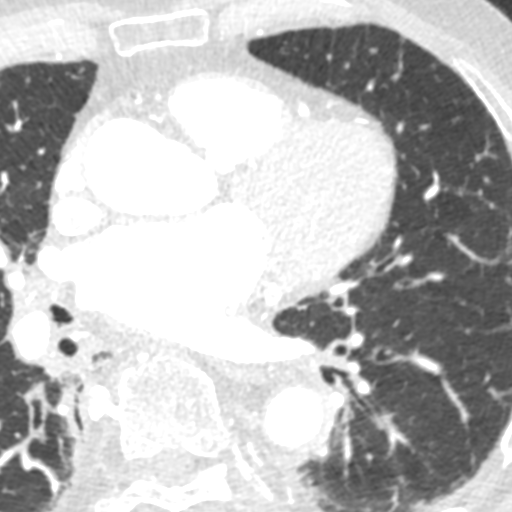
[im 1707/3840  lung]
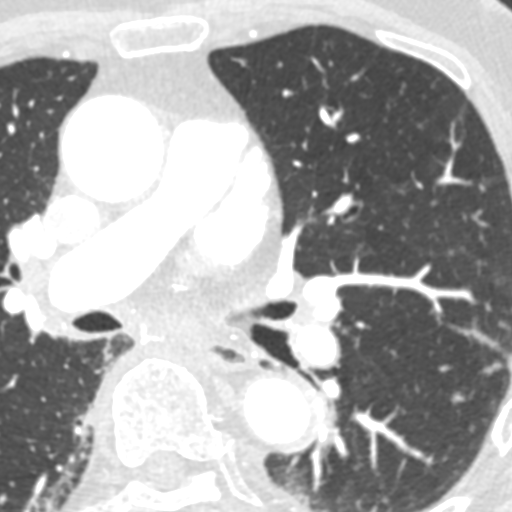
[im 2133/3840  lung]
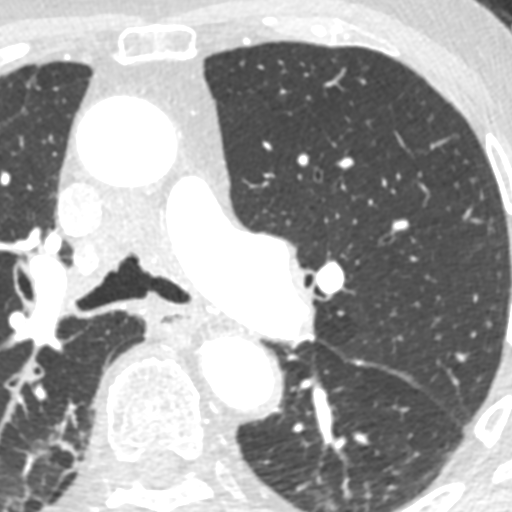
[im 2560/3840  lung]
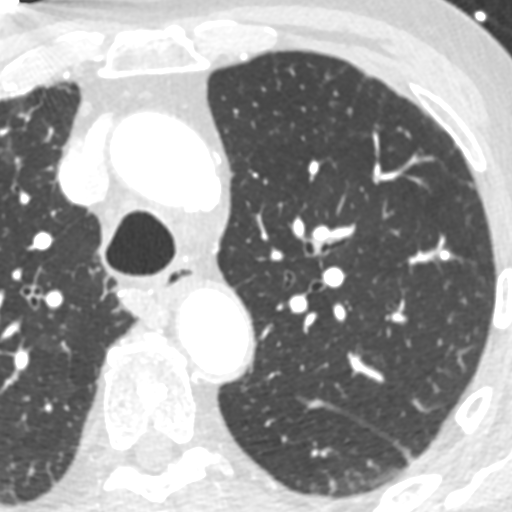
[im 2986/3840  lung]
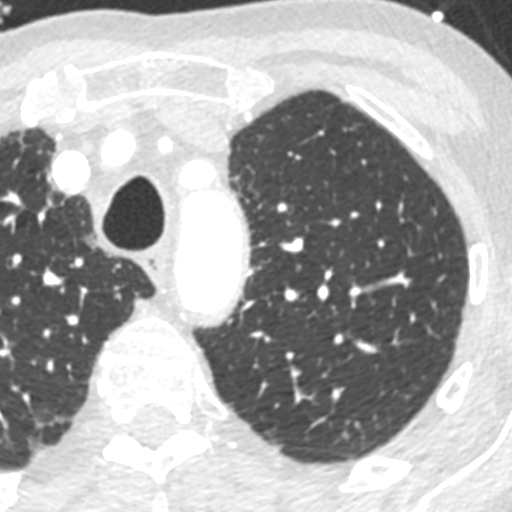
[im 3413/3840  lung]
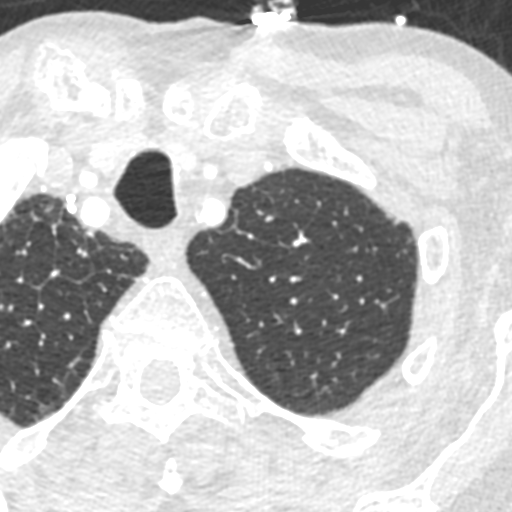

[16 of 37 positions shown; findings below may reference images not displayed]

FINDINGS: CTA CHEST FINDINGS

Cardiovascular: Heart size is borderline enlarged. There is no
significant pericardial fluid, thickening or pericardial
calcification. There is aortic atherosclerosis, as well as
atherosclerosis of the great vessels of the mediastinum and the
coronary arteries, including calcified atherosclerotic plaque in the
left anterior descending, left circumflex and right coronary
arteries. Severe thickening calcification of the aortic valve. Mild
calcifications of the mitral valve.

Mediastinum/Lymph Nodes: Multiple prominent borderline enlarged
mediastinal and bilateral hilar lymph nodes, measuring up to 1.2 cm
in short axis in the low right paratracheal nodal station,
nonspecific. Esophagus is unremarkable in appearance. No axillary
lymphadenopathy.

Lungs/Pleura: Areas of scarring or mild fibrosis in the lung bases
bilaterally. No acute consolidative airspace disease. No suspicious
appearing pulmonary nodules or masses are noted. Small bilateral
pleural effusions lying dependently.

Musculoskeletal/Soft Tissues: There are no aggressive appearing
lytic or blastic lesions noted in the visualized portions of the
skeleton.

CTA ABDOMEN AND PELVIS FINDINGS

Hepatobiliary: Small calcified granuloma in segment 3 of the liver.
No suspicious cystic or solid hepatic lesions. No intra or
extrahepatic biliary ductal dilatation. Small calcified gallstones
lying dependently in the gallbladder measuring up to 6 mm. No
findings to suggest an acute cholecystitis at this time.

Pancreas: No pancreatic mass. No pancreatic ductal dilatation. No
pancreatic or peripancreatic fluid collections or inflammatory
changes.

Spleen: Unremarkable.

Adrenals/Urinary Tract: 2 mm nonobstructive calculus in the lower
pole collecting system of the right kidney. Bilateral kidneys and
adrenal glands are otherwise unremarkable in appearance. No
hydroureteronephrosis. Urinary bladder is normal in appearance.

Stomach/Bowel: Normal appearance of the stomach. No pathologic
dilatation of small bowel or colon. Numerous colonic diverticulae
are noted, particularly in the descending colon and sigmoid colon,
without surrounding inflammatory changes to suggest an acute
diverticulitis at this time. Normal appendix.

Vascular/Lymphatic: Aortic atherosclerosis, without evidence of
aneurysm or dissection in the abdominal or pelvic vasculature.
Vascular findings and measurements pertinent to potential TAVR
procedure, as detailed below. No lymphadenopathy noted in the
abdomen or pelvis.

Reproductive: Prostate gland and seminal vesicles are unremarkable
in appearance.

Other: No significant volume of ascites.  No pneumoperitoneum.

Musculoskeletal: Status post PLIF from L4-S1. There are no
aggressive appearing lytic or blastic lesions noted in the
visualized portions of the skeleton.

VASCULAR MEASUREMENTS PERTINENT TO TAVR:

AORTA:

Minimal Aortic [YC] x 18 mm

Severity of Aortic Calcification-severe

RIGHT PELVIS:

Right Common Iliac Artery -

Minimal [YC] x 10.6 mm

Tortuosity-mild

Calcification-moderate

Right External Iliac Artery -

Minimal [YC] x 6.3 mm

Tortuosity-severe

Calcification-none

Right Common Femoral Artery -

Minimal [YC] x 8.4 mm

Tortuosity-mild

Calcification-mild-to-moderate

LEFT PELVIS:

Left Common Iliac Artery -

Minimal [YC] x 12.1 mm

Tortuosity-mild

Calcification-moderate

Left External Iliac Artery -

Minimal [YC] x 8.0 mm

Tortuosity-severe

Calcification-none

Left Common Femoral Artery -

Minimal [YC] x 8.2 mm

Tortuosity-mild

Calcification-mild-to-moderate

Review of the MIP images confirms the above findings.
IMPRESSION: 1. Vascular findings and measurements pertinent to potential TAVR
procedure, as detailed above.
2. Severe thickening calcification of the aortic valve, compatible
with the reported clinical history of severe aortic stenosis.
3. Aortic atherosclerosis, in addition to 3 vessel coronary artery
disease.
4. Cholelithiasis without evidence of acute cholecystitis at this
time.
5. 2 mm nonobstructive calculus in the lower pole collecting system
of the right kidney. No ureteral stones or findings of urinary tract
obstruction.
6. Extensive colonic diverticulosis without evidence of acute
diverticulitis at this time.
7. Additional incidental findings, as above.

## 2019-08-27 IMAGING — CT CT HEART MORP W/ CTA COR W/ SCORE W/ CA W/CM &/OR W/O CM
2 of 10 series · 8 of 20 positions shown, 9 images · non-contrast
Comparison: none

Addendum:
CLINICAL DATA: Aortic Stenosis

EXAM:
Cardiac TAVR CT
TECHNIQUE: The patient was scanned on a Siemens Force [REDACTED]ice scanner. A 120
kV retrospective scan was triggered in the ascending thoracic aorta
at 140 HU's. Gantry rotation speed was 250 msecs and collimation was
.6 mm. No beta blockade or nitro were given. The 3D data set was
reconstructed in 5% intervals of the R-R cycle. Systolic and
diastolic phases were analyzed on a dedicated work station using
MPR, MIP and VRT modes. The patient received 80 cc of contrast.

[Series 9: 0-90% · axial · 0.39mm/px · z∈[-60,+79]mm · 4 of 3840 slices shown, 5 images]
[im 768/3840  vessel]
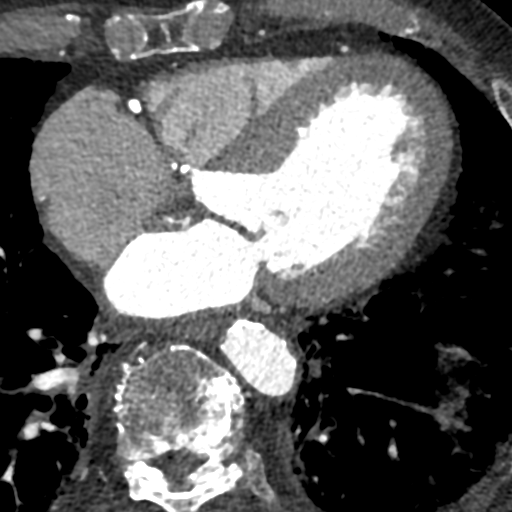
[im 768/3840  lung]
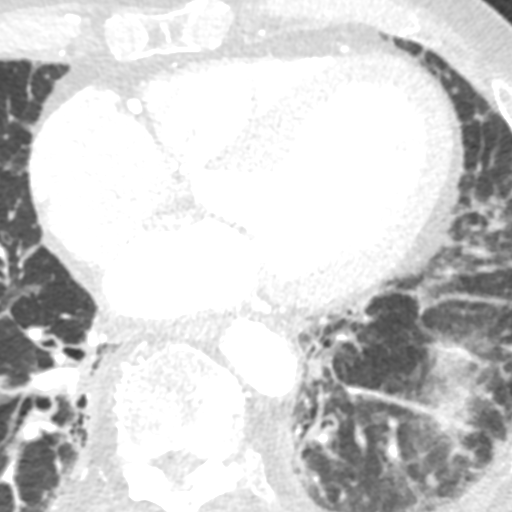
[im 1536/3840  vessel]
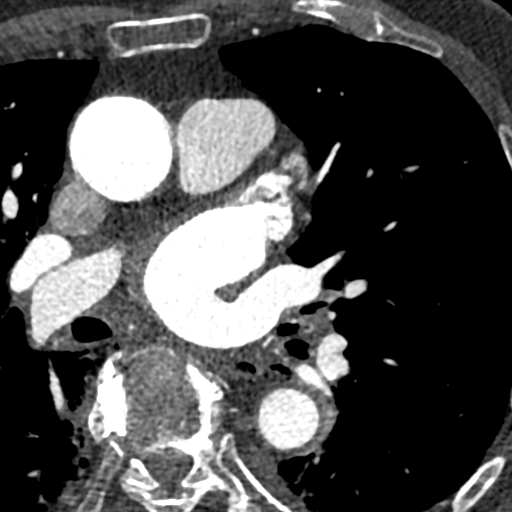
[im 2304/3840  vessel]
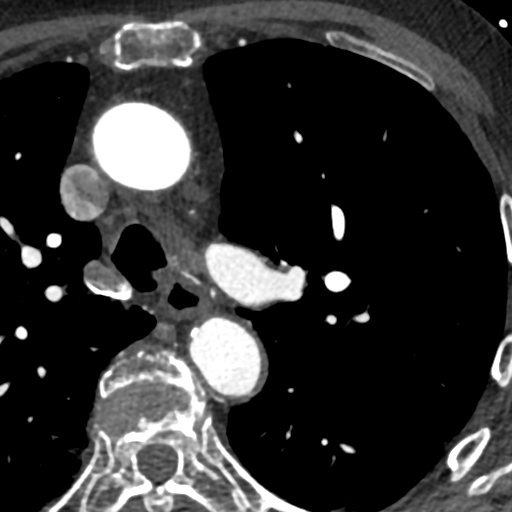
[im 3072/3840  vessel]
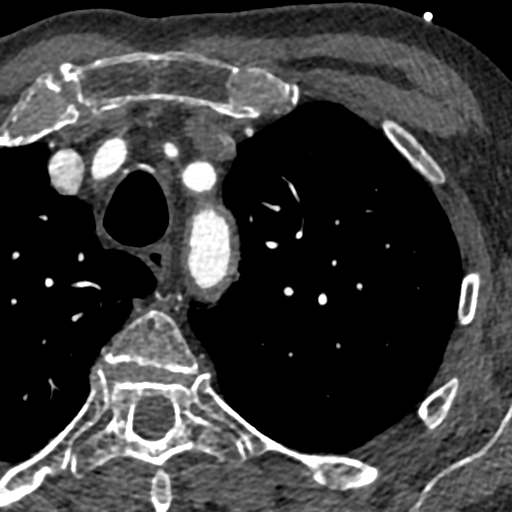

[Series 10: 5-95% · axial · 0.39mm/px · z∈[-60,+79]mm · 4 of 3840 slices shown]
[im 768/3840  vessel]
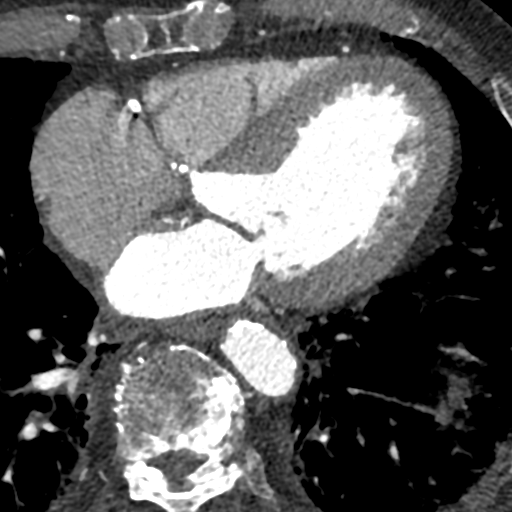
[im 1536/3840  vessel]
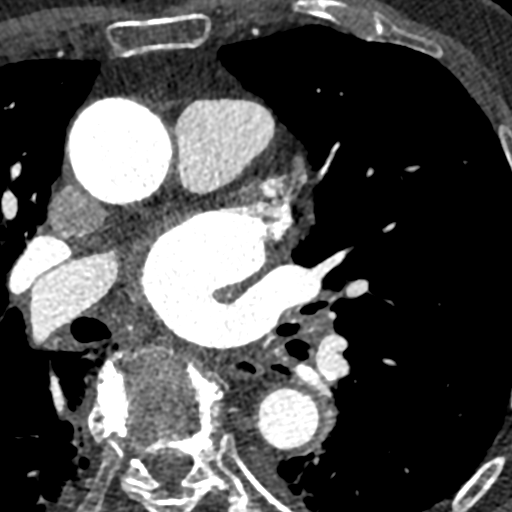
[im 2304/3840  vessel]
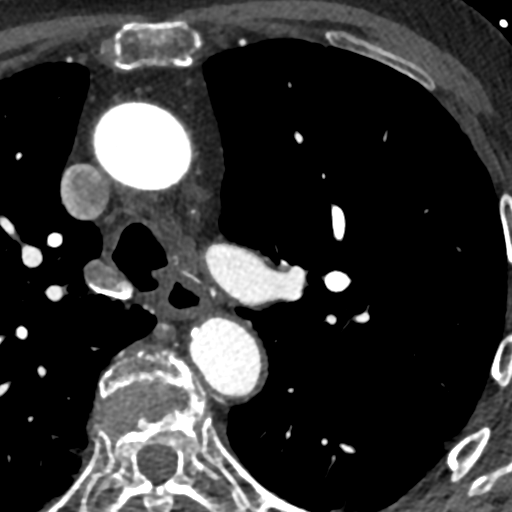
[im 3072/3840  vessel]
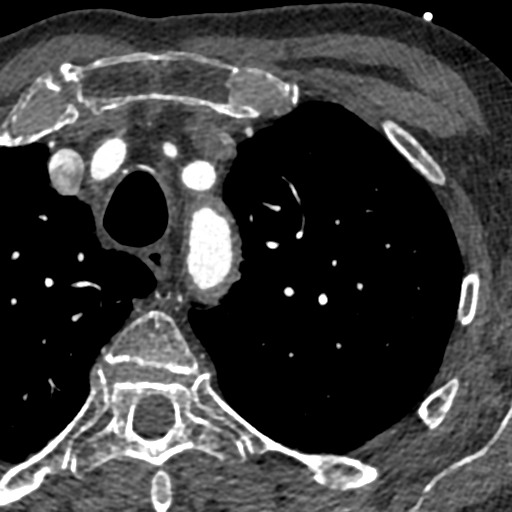

[8 of 20 positions shown; findings below may reference images not displayed]

FINDINGS: Aortic Valve: Tri leaflet with severely thickened leaflet tips and
calcification and restricted leaflet motion

Aorta: Mild root dilatation Normal arch vessels mild calcific
atherosclerosis

AUAD Junction: 32 mm

Ascending Thoracic Aorta: 38 mm

Aortic Arch: 32 mm

Descending Thoracic Aorta: 22 mm

Sinus of Valsalva Measurements:

Non-coronary: 34.5 mm

Right - coronary: 32.6 mm

Left -   coronary: 34 mm

Coronary Artery Height above Annulus:

Left Main: 16.7 mm above annulus

Right Coronary: 16.8 mm above annulus

Virtual Basal Annulus Measurements:

Maximum / Minimum Diameter: 29.1 mm x 25.7 mm

Perimeter: 87 mm

Area: 597 mm2

Coronary Arteries: Sufficient height above annulus for deployment

Optimum Fluoroscopic Angle for Delivery: LAO 3 Caudal 16 degrees
IMPRESSION: 1. Tri leaflet AV with annular area of 597 mm2 suitable for a 29 mm
Sapien 3 valve

2.  Coronary arteries sufficient height above annulus for deployment

3.  Mild aortic root dilatation 3.8 cm

4. Optimum angiographic angle for deployment LAO 3 Caudal 16 degrees

AUAD

ADDENDUM:
Extracardiac findings will be described separately under dictation
for contemporaneously obtained CTA chest, abdomen and pelvis
[DATE], please see that dictation for full details.

*** End of Addendum ***
FINDINGS: Aortic Valve: Tri leaflet with severely thickened leaflet tips and
calcification and restricted leaflet motion

Aorta: Mild root dilatation Normal arch vessels mild calcific
atherosclerosis

AUAD Junction: 32 mm

Ascending Thoracic Aorta: 38 mm

Aortic Arch: 32 mm

Descending Thoracic Aorta: 22 mm

Sinus of Valsalva Measurements:

Non-coronary: 34.5 mm

Right - coronary: 32.6 mm

Left -   coronary: 34 mm

Coronary Artery Height above Annulus:

Left Main: 16.7 mm above annulus

Right Coronary: 16.8 mm above annulus

Virtual Basal Annulus Measurements:

Maximum / Minimum Diameter: 29.1 mm x 25.7 mm

Perimeter: 87 mm

Area: 597 mm2

Coronary Arteries: Sufficient height above annulus for deployment

Optimum Fluoroscopic Angle for Delivery: LAO 3 Caudal 16 degrees
IMPRESSION: 1. Tri leaflet AV with annular area of 597 mm2 suitable for a 29 mm
Sapien 3 valve

2.  Coronary arteries sufficient height above annulus for deployment

3.  Mild aortic root dilatation 3.8 cm

4. Optimum angiographic angle for deployment LAO 3 Caudal 16 degrees

AUAD

## 2019-08-27 MED ORDER — IOHEXOL 350 MG/ML SOLN
100.0000 mL | Freq: Once | INTRAVENOUS | Status: AC | PRN
  Administered 2019-08-27: 100 mL via INTRAVENOUS

## 2019-08-27 MED ORDER — SODIUM BICARBONATE BOLUS VIA INFUSION
INTRAVENOUS | Status: AC
  Administered 2019-08-27: 75 meq via INTRAVENOUS
  Filled 2019-08-27: qty 1

## 2019-08-27 MED ORDER — SODIUM BICARBONATE 8.4 % IV SOLN
INTRAVENOUS | Status: DC
  Filled 2019-08-27: qty 500

## 2019-08-27 NOTE — Progress Notes (Signed)
VASCULAR LAB PRELIMINARY  PRELIMINARY  PRELIMINARY  PRELIMINARY  Carotid duplex completed.    Preliminary report:  See CV proc for preliminary results.   Avey Mcmanamon, RVT 08/27/2019, 4:04 PM

## 2019-09-02 ENCOUNTER — Encounter: Payer: Medicare Other | Admitting: Surgery

## 2019-09-09 ENCOUNTER — Telehealth: Payer: Self-pay | Admitting: Physician Assistant

## 2019-09-09 ENCOUNTER — Encounter: Payer: Self-pay | Admitting: Physician Assistant

## 2019-09-09 NOTE — Telephone Encounter (Signed)
  [facility]   MULTIDISCIPLINARY HEART VALVE TEAM   Patient's daughter, [name], called the office to discuss her father, Mr. Tokarczyk. He has significant back and leg pain and was recently seen at [facility] by Dr. Lynann Bologna. He reccommended steroid injections but felt it was best to wait until after his upcoming TAVR, tentatively scheduled for 10/6. He would need to wait a minimum of 4 weeks before coming of his anticoagulation and aspirin for a spinal injection and [name] does not think he can wait that long due to excruciating pain and falls associated with his spinal impingement. I discussed case with Dr. Burt Knack and we feel it best that he receive his injections prior to TAVR for pain control and need for him to be on uninterrupted antiplatelets/anticoagulation after TAVR. I have placed a call to Dr. Laurena Bering office to see if this can be arranged over the next couple weeks. Another option would be to push out his TAVR to 10/20.  Angelena Form PA-C  [facility]

## 2019-09-15 ENCOUNTER — Other Ambulatory Visit: Payer: Self-pay

## 2019-09-15 DIAGNOSIS — I350 Nonrheumatic aortic (valve) stenosis: Secondary | ICD-10-CM

## 2019-09-16 ENCOUNTER — Institutional Professional Consult (permissible substitution) (INDEPENDENT_AMBULATORY_CARE_PROVIDER_SITE_OTHER): Payer: Medicare Other | Admitting: Surgery

## 2019-09-16 ENCOUNTER — Encounter: Payer: Self-pay | Admitting: Surgery

## 2019-09-16 ENCOUNTER — Other Ambulatory Visit: Payer: Self-pay

## 2019-09-16 ENCOUNTER — Ambulatory Visit: Payer: Medicare Other | Attending: Cardiovascular Disease | Admitting: Physical Therapy

## 2019-09-16 ENCOUNTER — Encounter: Payer: Self-pay | Admitting: Physical Therapy

## 2019-09-16 VITALS — BP 122/81 | HR 92 | Temp 97.5°F | Resp 18 | Ht 69.0 in | Wt 192.0 lb

## 2019-09-16 DIAGNOSIS — I350 Nonrheumatic aortic (valve) stenosis: Secondary | ICD-10-CM | POA: Diagnosis not present

## 2019-09-16 DIAGNOSIS — R262 Difficulty in walking, not elsewhere classified: Secondary | ICD-10-CM | POA: Diagnosis not present

## 2019-09-16 NOTE — Therapy (Signed)
[place], [place], [address] Phone: [phone]   Fax:  [phone]  Physical Therapy Evaluation/Pre-TAVR  Patient Details  Name: Jacob Simpson MRN: [id number] Date of Birth: 02-Jan-1930 Referring Provider (PT): Sherren Mocha, MD   Encounter Date: 09/16/2019  PT End of Session - 09/16/19 1311    Visit Number  1    PT Start Time  1100    PT Stop Time  [id number]    PT Time Calculation (min)  48 min    Activity Tolerance  Patient tolerated treatment well    Behavior During Therapy  [facility] for tasks assessed/performed       Past Medical History:  Diagnosis Date  . A-fib ([place])   . Aortic aneurysm ([place])   . Aortic stenosis   . Asthma   . CHF (congestive heart failure) ([place])   . Hammertoe   . HTN (hypertension)   . TIA (transient ischemic attack)     Past Surgical History:  Procedure Laterality Date  . BACK SURGERY  12/89  . CATARACT EXTRACTION W/ INTRAOCULAR LENS IMPLANT Right 12/00  . CATARACT EXTRACTION W/ INTRAOCULAR LENS IMPLANT Left 8/01  . foot sugery Right 6/03  . FOOT SURGERY Left 1/96   hammertoe  . KIDNEY STONE SURGERY  7/90  . RIGHT/LEFT HEART CATH AND CORONARY ANGIOGRAPHY N/A 08/10/2019   Procedure: RIGHT/LEFT HEART CATH AND CORONARY ANGIOGRAPHY;  Surgeon: Wellington Hampshire, MD;  Location: [facility];  Service: Cardiovascular;  Laterality: N/A;  . ROTATOR CUFF REPAIR Right 1/98  . TOTAL KNEE ARTHROPLASTY Bilateral 1987    There were no vitals filed for this visit.   Subjective Assessment - 09/16/19 1108    Subjective  Over the past several years physical ability has been really good but has deteriorated. Began using a RW about 6 weeks ago after developing severe Rt thigh pain. 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 Has fallen twice due to weakness in last 6 weeks. Acquired SOB and fatige for several months. Recent development of acute CHF and acute  renal failure. Began around Feb.    Currently in Pain?  Yes    Pain Location  Leg   thigh   Pain Orientation  Right    Pain Descriptors / Indicators  Sore    Aggravating Factors   constant    Pain Relieving Factors  rubbing mucles         [facility] PT Assessment - 09/16/19 0001      Assessment   Medical Diagnosis  severe aortic stenosis    Referring Provider (PT)  Sherren Mocha, MD    Onset Date/Surgical Date  --   Feb 2020     Precautions   Precautions  Fall      Restrictions   Weight Bearing Restrictions  No      Balance Screen   Has the patient fallen in the past 6 months  Yes    How many times?  2    Has the patient had a decrease in activity level because of a fear of falling?   Yes    Is the patient reluctant to leave their home because of a fear of falling?   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 trade off staying with him  at night      Prior Function   Level of Independence  Independent    Vocation  Retired      Associate Professor   Overall Cognitive Status  Within Functional Limits for tasks assessed      Sensation   Additional Comments  WFL      ROM / Strength   AROM / PROM / Strength  AROM;Strength      AROM   Overall AROM Comments  WFL      Strength   Overall Strength Comments  gross 5/5 except noted    Strength Assessment Site  Hip;Knee;Hand    Right/Left hand  Right;Left    Right Hand Grip (lbs)  55    Left Hand Grip (lbs)  70    Right/Left Hip  Right    Right Hip Flexion  3-/5    Right/Left Knee  Right    Right Knee Flexion  5/5    Right Knee Extension  4-/5      Ambulation/Gait   Assistive device  Rolling walker       [facility] Pre-Surgical Assessment - 09/16/19 0001    5 Meter Walk Test- trial 1  4 sec    5 Meter Walk Test- trial 2  5 sec.     5 Meter Walk Test- trial 3  6 sec.    5 meter walk test average  5 sec     4 Stage Balance Test Position  1    Sit To Stand Test- trial 1  0 sec.    Comment  unable without UE    6 Minute Walk- Baseline  yes    BP (mmHg)  145/72    HR (bpm)  86    02 Sat (%RA)  97 %    Modified Borg Scale for Dyspnea  0- Nothing at all    Perceived Rate of Exertion (Borg)  6-    6 Minute Walk Post Test  yes    BP (mmHg)  148/74    HR (bpm)  88    02 Sat (%RA)  97 %    Modified Borg Scale for Dyspnea  5- Strong or hard breathing    Perceived Rate of Exertion (Borg)  13- Somewhat hard    Aerobic Endurance Distance Walked  370    Endurance additional comments  73% disability compared to age related norm              Objective measurements completed on examination: See above findings.             Plan - 09/16/19 1312    PT Frequency  --   one time pre-TAVR   Consulted and Agree with Plan of Care  Patient;Family member/caregiver    Family Member Consulted  Daughter      Clinical Impression Statement: Pt is a 85 yo M presenting to OP PT for evaluation prior to possible TAVR surgery due to severe aortic stenosis. Pt reports onset of SOB and fatigue beginning around February. Symptoms are  limiting independence and functional mobility. Pt presents with WFL ROM and strength and subactue musculoskeletal pain in Rt thigh. He did complain of pain in Rt MCP joints after grip strength test.  Pt ambulated 370 feet in 3:30 before PT requested pt take a seated rest beak due to dropping O2 levels. At time of rest, patient's HR was 100 bpm and O2 was 84 on room air, dropped to 73 as he rested and came back  up after a few minutes. Pt reported 5/10 shortness of breath on modified scale for dyspnea. Pt was not able to resume after rest Pt ambulated a total of 370 feet in 6 minute walk and reported 5/10 SOB on modified scale for dyspena and 13/20 RPE on Borg's perceived exertion and pain scale at the end of the walk. During the 6 minute walk test, patient's HR increased to 100 BPM  and O2 saturation decreased to 73%. Based on the Short Physical Performance Battery, patient has a frailty rating of 5/12 with </= 5/12 considered frail.   Visit Diagnosis: Difficulty in walking, not elsewhere classified     Problem List Patient Active Problem List   Diagnosis Date Noted  . Hypotension 07/05/2019  . HTN (hypertension) 03/10/2019  . Severe aortic stenosis 03/10/2019  . CHF (congestive heart failure) ([place]) 02/24/2019  . Pain in limb 09/28/2013  . Arthritis of foot, degenerative 09/28/2013   Lynna Zamorano C. Jeshurun Oaxaca PT, DPT 09/16/19 1:26 PM   [facility] [address] [place], [place], [address] Phone: [phone]   Fax:  [phone]  Name: Jacob Simpson MRN: [id number] Date of Birth: Nov 29, 1930

## 2019-09-16 NOTE — Progress Notes (Signed)
Patient ID: Jacob Simpson, male   DOB: 1930-08-29, 85 y.o.   MRN: [id number]  [facility] CONSULTATION REPORT  Referring Provider is Wellington Hampshire, MD Primary Cardiologist is No primary care provider on file. PCP is Olmedo, Guy Begin, MD  Chief Complaint  Patient presents with   Aortic Stenosis    new patient consultation, review all studies    HPI:  The patient is an 85 year old gentleman with a history of hypertension, B-cell lymphoma treated with chemotherapy, atrial fibrillation, aortic stenosis, and congestive heart failure who reports being short of breath of several years.  He said that this has continued to progress and he is now short of breath walking around in his house, bending over to put on his shoes, or getting dressed.  He has had no chest pain or pressure.  Does report some episodes of dizziness in the past but has never had any syncope.  He denies orthopnea and PND.  His most recent echocardiogram on 07/06/2019 showed a mean gradient across the aortic valve of 58 mmHg with a peak gradient of 72 mmHg.  Aortic valve area was 0.63 cm.  Left ventricular systolic function was normal at 55 to 60%.  There is degenerative mitral valve disease with moderate mitral regurgitation.  Cardiac catheterization on 08/10/2019 which showed minimal nonobstructive coronary disease.  The aortic valve was not crossed.  Right heart catheterization did show severe pulmonary hypertension with a PA pressure of 74/30 with a mean of 50.  Cardiac index was 2.09.  Pulmonary wedge pressure was 33 mm with prominent V waves suggesting mitral rotation.  The patient reports that about 6 weeks ago he developed significant weakness and pain in the right lower extremity which he never had before.  He has multiple falls due to his right leg giving out.  He has been using a walker for stabilization.  His daughter is with him today and  said that he was seen at [facility] and told that he was not a candidate for any spine surgery it was felt that his symptoms are most likely due to his spine.  He is scheduled to have a steroid injection in his spine later this week.  The patient is retired and lives by himself at home.  He has 5 children and they alternate staying with him at night.  He has a caretaker that comes in during the day.  Past Medical History:  Diagnosis Date   A-fib [facility])    Aortic aneurysm (HCC)    Aortic stenosis    Asthma    CHF (congestive heart failure) (HCC)    Hammertoe    HTN (hypertension)    TIA (transient ischemic attack)     Past Surgical History:  Procedure Laterality Date   BACK SURGERY  12/89   CATARACT EXTRACTION W/ INTRAOCULAR LENS IMPLANT Right 12/00   CATARACT EXTRACTION W/ INTRAOCULAR LENS IMPLANT Left 8/01   foot sugery Right 6/03   FOOT SURGERY Left 1/96   hammertoe   KIDNEY STONE SURGERY  7/90   RIGHT/LEFT HEART CATH AND CORONARY ANGIOGRAPHY N/A 08/10/2019   Procedure: RIGHT/LEFT HEART CATH AND CORONARY ANGIOGRAPHY;  Surgeon: Wellington Hampshire, MD;  Location: [facility] [place];  Service: Cardiovascular;  Laterality: N/A;   ROTATOR CUFF REPAIR Right 1/98   TOTAL KNEE ARTHROPLASTY Bilateral 1987    Family History  Problem Relation Age of Onset   Heart disease Father  Social History   Socioeconomic History   Marital status: Widowed    Spouse name: Not on file   Number of children: 5   Years of education: Not on file   Highest education level: Not on file  Occupational History   Occupation: Retired   Scientist, product/process development strain: Not hard at [employer]    Worry: Never true    Inability: Never true   Transportation needs    Medical: No    Non-medical: No  Tobacco Use   Smoking status: Former Smoker   Smokeless tobacco: Never Used   Tobacco comment: quit 1989  Substance and Sexual Activity   Alcohol  use: No   Drug use: No   Sexual activity: Not on file  Lifestyle   Physical activity    Days per week: Not on file    Minutes per session: Not on file   Stress: Only a little  Relationships   Social connections    Talks on phone: More than three times a week    Gets together: Not on file    Attends religious service: Not on file    Active member of club or organization: Not on file    Attends meetings of clubs or organizations: Not on file    Relationship status: Not on file   Intimate partner violence    Fear of current or ex partner: No    Emotionally abused: No    Physically abused: No    Forced sexual activity: No  Other Topics Concern   Not on file  Social History Narrative   Not on file    Current Outpatient Medications  Medication Sig Dispense Refill   acetaminophen (TYLENOL) 500 MG tablet Take 1,000 mg by mouth every 6 (six) hours as needed for mild pain, moderate pain or fever.     apixaban (ELIQUIS) 2.5 MG TABS tablet Take 1 tablet (2.5 mg total) by mouth 2 (two) times daily.     atorvastatin (LIPITOR) 40 MG tablet Take 40 mg by mouth daily.     azelastine (ASTELIN) 0.1 % nasal spray Place 1 spray into both nostrils 2 (two) times daily as needed for rhinitis or allergies. Use in each nostril as directed     cetirizine (ZYRTEC) 10 MG tablet Take 10 mg by mouth daily.      famotidine (PEPCID) 10 MG tablet Take 10 mg by mouth daily as needed for heartburn or indigestion.     fluticasone (FLOVENT HFA) 110 MCG/ACT inhaler Inhale 1 puff into the lungs 2 (two) times daily as needed (respiratory symptoms).      furosemide (LASIX) 40 MG tablet Take 1 tablet (40 mg total) by mouth daily. (Patient taking differently: Take 20 mg by mouth daily. ) 30 tablet 3   gabapentin (NEURONTIN) 100 MG capsule Take 1 capsule (100 mg total) by mouth 3 (three) times daily as needed (back pain). (Patient taking differently: Take 300 mg by mouth 3 (three) times daily. ) 30 capsule  0   guaiFENesin (MUCINEX) 600 MG 12 hr tablet Take 600 mg by mouth 2 (two) times daily as needed for cough or to loosen phlegm.     Ipratropium-Albuterol (COMBIVENT) 20-100 MCG/ACT AERS respimat Inhale 1 puff into the lungs every 6 (six) hours. (Patient taking differently: Inhale 1 puff into the lungs every 6 (six) hours as needed for wheezing or shortness of breath. ) 4 g 0   lactulose (CHRONULAC) 10 GM/15ML solution  Take 20 g by mouth daily as needed for moderate constipation.      meclizine (ANTIVERT) 12.5 MG tablet Take 12.5 mg by mouth 3 (three) times daily as needed for dizziness.     metoprolol succinate (TOPROL-XL) 50 MG 24 hr tablet Take 1 tablet (50 mg total) by mouth daily. Take with or immediately following a meal. 30 tablet 0   mirtazapine (REMERON) 7.5 MG tablet Take 7.5 mg by mouth at bedtime.     omeprazole (PRILOSEC) 20 MG capsule Take 20 mg by mouth daily as needed (stomach acid).     ondansetron (ZOFRAN-ODT) 8 MG disintegrating tablet Take 8 mg by mouth every 8 (eight) hours as needed for nausea or vomiting.     polyethylene glycol (MIRALAX) 17 g packet Take 17 g by mouth daily as needed for moderate constipation. 14 each 0   tamsulosin (FLOMAX) 0.4 MG CAPS capsule Take 1 capsule (0.4 mg total) by mouth daily after supper. 90 capsule 3   No current facility-administered medications for this visit.     Allergies  Allergen Reactions   Penicillins Rash    Did it involve swelling of the face/tongue/throat, SOB, or low BP? Unknown Did it involve sudden or severe rash/hives, skin peeling, or any reaction on the inside of your mouth or nose? Unknown Did you need to seek medical attention at a hospital or doctor's office? Unknown When did it last happen?Unknown If all above answers are NO, may proceed with cephalosporin use.       Review of Systems:   General:  + decreased appetite, + decreased energy, + weight gain, no weight loss, no fever  Cardiac:  no  chest pain with exertion, no chest pain at rest, +SOB with mild exertion, no resting SOB, no PND, no orthopnea, + palpitations, + arrhythmia, + atrial fibrillation, + LE edema, + dizzy spells, no syncope  Respiratory:  + exertional shortness of breath, no home oxygen, + productive cough, no dry cough, no bronchitis, + wheezing, no hemoptysis, no asthma, no pain with inspiration or cough, no sleep apnea, no CPAP at night  GI:   No difficulty swallowing, no reflux, + frequent heartburn, no hiatal hernia, no abdominal pain, no constipation, no diarrhea, no hematochezia, no hematemesis, no melena  GU:   no dysuria,  + frequency, no urinary tract infection, no hematuria, + enlarged prostate, no kidney stones, + kidney disease  Vascular:  no pain suggestive of claudication, + pain in feet, no leg cramps, no varicose veins, no DVT, no non-healing foot ulcer  Neuro:   no stroke, + TIA's, no seizures, no headaches, no temporary blindness one eye,  no slurred speech, no peripheral neuropathy, no chronic pain, + instability of gait, + memory/cognitive dysfunction  Musculoskeletal: + arthritis, no joint swelling, + myalgias, + difficulty walking, + reduced mobility   Skin:   no rash, no itching, no skin infections, no pressure sores or ulcerations  Psych:   + anxiety, no depression, no nervousness, no unusual recent stress  Eyes:   no blurry vision, no floaters, no recent vision changes, + wears glasses or contacts  ENT:   + hearing loss, no loose or painful teeth, + upper dentures, last saw dentist every 6 months  Hematologic:  + easy bruising, no abnormal bleeding, no clotting disorder, no frequent epistaxis  Endocrine:  + diabetes, does not check CBG's at home        Physical Exam:   BP 122/81 (BP Location: Left Arm, Patient  Position: Sitting, Cuff Size: Normal)    Pulse 92    Temp (!) 97.5 F (36.4 C)    Resp 18    Ht 5\' 9"  (1.753 m)    Wt 192 lb (87.1 kg)    SpO2 93% Comment: RA   BMI 28.35 kg/m    General:  Elderly, somewhat frail,  well-appearing  HEENT:  Unremarkable, [place]/AT, PERLA, EOMI, oropharynx clear  Neck:   no JVD, no bruits, no adenopathy or thyromegaly  Chest:   clear to auscultation, symmetrical breath sounds, no wheezes, no rhonchi   CV:   RRR, grade III/VI crescendo/decrescendo murmur heard best at RSB,  no diastolic murmur  Abdomen:  soft, non-tender, no masses or organomegaly  Extremities:  warm, well-perfused, pulses not palpable in feet, moderate bilateral LE edema  Rectal/GU  Deferred  Neuro:   Grossly non-focal and symmetrical throughout  Skin:   Clean and dry, no rashes, no breakdown   Diagnostic Tests:  ECHOCARDIOGRAM REPORT       Patient Name:   BRODRIC STACH Date of Exam: 07/06/2019 Medical Rec #:  [id number]      Height:       69.0 in Accession #:    [id number]     Weight:       191.2 lb Date of Birth:  [birth year]      BSA:          2.03 m Patient Age:    63 years       BP:           145/85 mmHg Patient Gender: M              HR:           84 bpm. Exam Location:  [facility]    Procedure: 2D Echo, Cardiac Doppler and Color Doppler  Indications:     Aortic stenosis 424.1   History:         Patient has prior history of Echocardiogram examinations, most                  recent 02/25/2019. CHF TIA Atrial Fibrillation Risk Factors:                  Hypertension. Aortic stenosis.   Sonographer:     Sherrie Sport RDCS (AE) Referring Phys:  [id number] Valetta Fuller DODD MAYO Diagnosing Phys: Nelva Bush MD  IMPRESSIONS    1. The left ventricle has normal systolic function, with an ejection fraction of 55-60%. The cavity size was normal. There is moderately increased left ventricular wall thickness. Left ventricular diastolic function could not be evaluated.  2. The right ventricle has normal systolic function. The cavity was mildly enlarged. There is no increase in right ventricular wall thickness. Right ventricular systolic pressure is severely elevated with  an estimated pressure of 71.9 mmHg.  3. Left atrial size was mildly dilated.  4. Right atrial size was moderately dilated.  5. The mitral valve is degenerative. Mild thickening of the mitral valve leaflet. Moderate calcification of the mitral valve leaflet. Mitral valve regurgitation is moderate by color flow Doppler.  6. The tricuspid valve is not well visualized. Tricuspid valve regurgitation is mild-moderate.  7. The aortic valve was not well visualized. Moderate thickening of the aortic valve. Aortic valve regurgitation is mild by color flow Doppler. Severe stenosis of the aortic valve.  8. The aortic root is normal in size and structure.  FINDINGS  Left Ventricle: The left ventricle has  normal systolic function, with an ejection fraction of 55-60%. The cavity size was normal. There is moderately increased left ventricular wall thickness. Left ventricular diastolic function could not be evaluated.  Right Ventricle: The right ventricle has normal systolic function. The cavity was mildly enlarged. There is no increase in right ventricular wall thickness. Right ventricular systolic pressure is severely elevated with an estimated pressure of 71.9 mmHg.  Left Atrium: Left atrial size was mildly dilated.  Right Atrium: Right atrial size was moderately dilated. Right atrial pressure is estimated at 3 mmHg.  Interatrial Septum: No atrial level shunt detected by color flow Doppler.  Pericardium: There is no evidence of pericardial effusion.  Mitral Valve: The mitral valve is degenerative in appearance. Mild thickening of the mitral valve leaflet. Moderate calcification of the mitral valve leaflet. Mitral valve regurgitation is moderate by color flow Doppler.  Tricuspid Valve: The tricuspid valve is not well visualized. Tricuspid valve regurgitation is mild-moderate by color flow Doppler.  Aortic Valve: The aortic valve was not well visualized Moderate thickening of the aortic valve.  Aortic valve regurgitation is mild by color flow Doppler. There is Severe stenosis of the aortic valve, with a calculated valve area of 0.69 cm.  Pulmonic Valve: The pulmonic valve was not well visualized. Pulmonic valve regurgitation is not visualized by color flow Doppler. No evidence of pulmonic stenosis.  Aorta: The aortic root is normal in size and structure.  Pulmonary Artery: The pulmonary artery is not well seen.  Venous: The inferior vena cava measures 1.90 cm, is normal in size with greater than 50% respiratory variability.    +--------------+--------++  LEFT VENTRICLE            +--------------+---------++ +--------------+--------++  Diastology                  PLAX 2D                   +--------------+---------++ +--------------+--------++  LV e' lateral: 7.83 cm/s    LVIDd:         4.18 cm    +--------------+---------++ +--------------+--------++  LV e' medial:  7.40 cm/s    LVIDs:         2.99 cm    +--------------+---------++ +--------------+--------++  LV PW:         1.31 cm    +--------------+--------++  LV IVS:        1.51 cm    +--------------+--------++  LVOT diam:     2.10 cm    +--------------+--------++  LV SV:         43 ml      +--------------+--------++  LV SV Index:   20.73      +--------------+--------++  LVOT Area:     3.46 cm   +--------------+--------++                            +--------------+--------++  +---------------+----------++  RIGHT VENTRICLE              +---------------+----------++  RV Basal diam:  3.85 cm      +---------------+----------++  RV S prime:     13.20 cm/s   +---------------+----------++  TAPSE (M-mode): 3.9 cm       +---------------+----------++  +---------------+-------++-----------++  LEFT ATRIUM              Index         +---------------+-------++-----------++  LA diam:        4.30  cm  2.12 cm/m    +---------------+-------++-----------++  LA Vol (A2C):   98.9 ml  48.79  ml/m   +---------------+-------++-----------++  LA Vol (A4C):   56.5 ml  27.88 ml/m   +---------------+-------++-----------++  LA Biplane Vol: 75.0 ml  37.00 ml/m   +---------------+-------++-----------++ +------------+---------++-----------++  RIGHT ATRIUM            Index         +------------+---------++-----------++  RA Area:     27.40 cm                +------------+---------++-----------++  RA Volume:   89.70 ml   44.26 ml/m   +------------+---------++-----------++  +------------------+------------++ +---------------+--------++  AORTIC VALVE                       PULMONIC VALVE             +------------------+------------++ +---------------+--------++  AV Area (Vmax):    0.63 cm        PV Vmax:        0.87 m/s   +------------------+------------++ +---------------+--------++  AV Area (Vmean):   0.56 cm        PV Peak grad:   3.0 mmHg   +------------------+------------++ +---------------+--------++  AV Area (VTI):     0.69 cm        RVOT Peak grad: 2 mmHg     +------------------+------------++ +---------------+--------++  AV Vmax:           424.00 cm/s    +------------------+------------++  AV Vmean:          304.667 cm/s   +------------------+------------++  AV VTI:            0.839 m        +------------------+------------++  AV Peak Grad:      71.9 mmHg      +------------------+------------++  AV Mean Grad:      58.0 mmHg      +------------------+------------++  LVOT Vmax:         76.90 cm/s     +------------------+------------++  LVOT Vmean:        49.200 cm/s    +------------------+------------++  LVOT VTI:          0.168 m        +------------------+------------++  LVOT/AV VTI ratio: 0.20           +------------------+------------++   +-------------+-------++  AORTA                   +-------------+-------++  Ao Root diam: 2.90 cm   +-------------+-------++  +---------------+-----------++  TRICUSPID VALVE                +---------------+-----------++  TR Peak grad:   68.9 mmHg     +---------------+-----------++  TR Vmax:        415.00 cm/s   +---------------+-----------++   +--------------+-------+  SHUNTS                  +--------------+-------+  Systemic VTI:  0.17 m   +--------------+-------+  Systemic Diam: 2.10 cm  +--------------+-------+  +---------+-------+  IVC                +---------+-------+  IVC diam: 1.90 cm  +---------+-------+    Nelva Bush MD Electronically signed by Nelva Bush MD Signature Date/Time: 07/06/2019/12:35:18 PM     Physicians  Panel Physicians Referring Physician Case Authorizing Physician  Wellington Hampshire, MD (Primary)    Procedures  RIGHT/LEFT HEART CATH AND  CORONARY ANGIOGRAPHY  Conclusion  1.  Minimal nonobstructive coronary artery disease. 2.  Severe aortic stenosis by echo.  I did not attempt to cross the aortic valve which was noted to be heavily calcified with restricted opening on fluoroscopy. 3.  Right heart catheterization showed severely elevated filling pressures, severe pulmonary hypertension and mildly reduced cardiac output.  Pulmonary wedge pressure was 33 mmHg with prominent V waves suggestive of mitral regurgitation.  PA pressure was 74/30 with a mean of 50 mmHg.  Cardiac output was 4.15 with a cardiac index of 2.09.  Recommendations: I am going to resume furosemide and increase the dose to 40 mg once daily. Continue evaluation for TAVR. Given underlying chronic kidney disease and heart failure, consider hospital admission before TAVR for diuresis and monitoring of renal function.  Indications  Severe aortic stenosis [I35.0 (ICD-10-CM)]  Procedural Details  Technical Details Procedural Details: The pre-existing IV in the right antecubital vein was exchanged under sterile fashion to a slender sheath. The right wrist was prepped, draped, and anesthetized with 1% lidocaine. Using the modified Seldinger technique, a 5  French sheath was introduced into the right radial artery. 2.5 mg of verapamil was administered through the sheath, weight-based unfractionated heparin was administered intravenously. Right heart catheterization was performed using a 5 French Swan-Ganz catheter. Cardiac output was calculated by the Fick method. A Jackie catheter was used for selective coronary angiography.  A JR4 catheter was needed to engage the right coronary artery.  I did not attempt to cross the aortic valve.  Catheter exchanges were performed over an exchange length guidewire. There were no immediate procedural complications. A TR band was used for radial hemostasis at the completion of the procedure.  The patient was transferred to the post catheterization recovery area for further monitoring.  Estimated blood loss <50 mL.   During this procedure no sedation was administered.  Medications (Filter: Administrations occurring from 08/10/19 1040 to 08/10/19 1152) (important)  Continuous medications are totaled by the amount administered until 08/10/19 1152.  Medication Rate/Dose/Volume Action  Date Time   verapamil (ISOPTIN) injection (mg) 2.5 mg Given 08/10/19 1112   Total dose as of 08/10/19 1152        2.5 mg        heparin injection (Units) 4,000 Units Given 08/10/19 1124   Total dose as of 08/10/19 1152        4,000 Units        Heparin (Porcine) in NaCl 1000-0.9 UT/500ML-% SOLN (mL) 500 mL Given 08/10/19 1129   Total dose as of 08/10/19 1152        500 mL        iohexol (OMNIPAQUE) 300 MG/ML solution (mL) 70 mL Given 08/10/19 1137   Total dose as of 08/10/19 1152        70 mL        Contrast  Medication Name Total Dose  iohexol (OMNIPAQUE) 300 MG/ML solution 70 mL    Radiation/Fluoro  Fluoro time: 9.4 (min) DAP: 34 (Gycm2) Cumulative Air Kerma: 455 (mGy)  Coronary Findings  Diagnostic Dominance: Right Left Main  Vessel is angiographically normal.  Left Anterior Descending  The vessel exhibits minimal  luminal irregularities.  Left Circumflex  The vessel exhibits minimal luminal irregularities.  Right Coronary Artery  The vessel exhibits minimal luminal irregularities.  Intervention  No interventions have been documented. Coronary Diagrams  Diagnostic Dominance: Right  Intervention  Implants   No implant documentation for this case.  Syngo Images  Show images for CARDIAC CATHETERIZATION  Images on Long Term Storage  Show images for [place], [place] to Procedure Log  Procedure Log    Hemo Data (last day) before discharge   AO Systolic Cath Pressure  AO Diastolic Cath Pressure  AO Mean Cath Pressure  PA Systolic Cath Pressure  PA Diastolic Cath Pressure  PA Mean Cath Pressure  RA Wedge A Wave  RA Wedge V Wave  RV Systolic Cath Pressure  RV Diastolic Cath Pressure  RV End Diastolic  PCW A Wave  PCW V Wave  PCW Mean  AO O2 Sat  PA O2 Sat  AO O2 Sat  Fick C.O.  Fick C.I.   --  --  --  --  --  --  13 mmHg  11 mmHg  --  --  --  31 mmHg  46 mmHg  33 mmHg  --  --  --  4.15 L/min  2.09 L/min/m2   --  --  --  --  --  --  --  --  72 mmHg  7 mmHg  11 mmHg  --  --  --  --  --  --  --  --   --  --  --  74 mmHg  30 mmHg  50 mmHg  --  --  --  --  --  --  --  --  --  --  --  --  --   --  --  --  --  --  --  --  --  --  --  --  --  --  --  99 %  --  SA  --  --   --  --  --  --  --  --  --  --  --  --  --  --  --  --  --  MV  --  --  --   141  75 mmHg  104 mmHg  --  --  --  --  --  --  --  --  --  --  --  --  --  --  --  --   153  82 mmHg  113 mmHg                                     ADDENDUM REPORT: 08/27/2019 13:22  ADDENDUM: Extracardiac findings will be described separately under dictation for contemporaneously obtained CTA chest, abdomen and pelvis 08/27/2019, please see that dictation for full details.   Electronically Signed   By: Vinnie Langton M.D.   On: 08/27/2019 13:22   Addended by Etheleen Mayhew, MD on 08/27/2019 1:25 PM    Study Result  CLINICAL  DATA:  Aortic Stenosis  EXAM: Cardiac TAVR CT  TECHNIQUE: The patient was scanned on a Siemens Force [id number] slice scanner. A 120 kV retrospective scan was triggered in the ascending thoracic aorta at 140 HU's. Gantry rotation speed was 250 msecs and collimation was .6 mm. No beta blockade or nitro were given. The 3D data set was reconstructed in 5% intervals of the R-R cycle. Systolic and diastolic phases were analyzed on a dedicated work station using MPR, MIP and VRT modes. The patient received 80 cc of contrast.  FINDINGS: Aortic Valve: Tri leaflet with severely thickened leaflet tips and calcification and restricted leaflet motion  Aorta: Mild root dilatation Normal arch vessels mild calcific  atherosclerosis  Sino-tubular Junction: 32 mm  Ascending Thoracic Aorta: 38 mm  Aortic Arch: 32 mm  Descending Thoracic Aorta: 22 mm  Sinus of Valsalva Measurements:  Non-coronary: 34.5 mm  Right - coronary: 32.6 mm  Left -   coronary: 34 mm  Coronary Artery Height above Annulus:  Left Main: 16.7 mm above annulus  Right Coronary: 16.8 mm above annulus  Virtual Basal Annulus Measurements:  Maximum / Minimum Diameter: 29.1 mm x 25.7 mm  Perimeter: 87 mm  Area: 597 mm2  Coronary Arteries: Sufficient height above annulus for deployment  Optimum Fluoroscopic Angle for Delivery: LAO 3 Caudal 16 degrees  IMPRESSION: 1. Tri leaflet AV with annular area of 597 mm2 suitable for a 29 mm Sapien 3 valve  2.  Coronary arteries sufficient height above annulus for deployment  3.  Mild aortic root dilatation 3.8 cm  4. Optimum angiographic angle for deployment LAO 3 Caudal 16 degrees  Jenkins Rouge  Electronically Signed: By: Jenkins Rouge M.D. On: 08/27/2019 13:10       CLINICAL DATA:  85 year old male with history of aortic stenosis. Preprocedural study prior to potential transcatheter aortic valve replacement (TAVR)  procedure.  EXAM: CT ANGIOGRAPHY CHEST, ABDOMEN AND PELVIS  TECHNIQUE: Multidetector CT imaging through the chest, abdomen and pelvis was performed using the standard protocol during bolus administration of intravenous contrast. Multiplanar reconstructed images and MIPs were obtained and reviewed to evaluate the vascular anatomy.  CONTRAST:  163mL OMNIPAQUE IOHEXOL 350 MG/ML SOLN  COMPARISON:  No priors.  FINDINGS: CTA CHEST FINDINGS  Cardiovascular: Heart size is borderline enlarged. There is no significant pericardial fluid, thickening or pericardial calcification. There is aortic atherosclerosis, as well as atherosclerosis of the great vessels of the mediastinum and the coronary arteries, including calcified atherosclerotic plaque in the left anterior descending, left circumflex and right coronary arteries. Severe thickening calcification of the aortic valve. Mild calcifications of the mitral valve.  Mediastinum/Lymph Nodes: Multiple prominent borderline enlarged mediastinal and bilateral hilar lymph nodes, measuring up to 1.2 cm in short axis in the low right paratracheal nodal station, nonspecific. Esophagus is unremarkable in appearance. No axillary lymphadenopathy.  Lungs/Pleura: Areas of scarring or mild fibrosis in the lung bases bilaterally. No acute consolidative airspace disease. No suspicious appearing pulmonary nodules or masses are noted. Small bilateral pleural effusions lying dependently.  Musculoskeletal/Soft Tissues: There are no aggressive appearing lytic or blastic lesions noted in the visualized portions of the skeleton.  CTA ABDOMEN AND PELVIS FINDINGS  Hepatobiliary: Small calcified granuloma in segment 3 of the liver. No suspicious cystic or solid hepatic lesions. No intra or extrahepatic biliary ductal dilatation. Small calcified gallstones lying dependently in the gallbladder measuring up to 6 mm. No findings to suggest an acute  cholecystitis at this time.  Pancreas: No pancreatic mass. No pancreatic ductal dilatation. No pancreatic or peripancreatic fluid collections or inflammatory changes.  Spleen: Unremarkable.  Adrenals/Urinary Tract: 2 mm nonobstructive calculus in the lower pole collecting system of the right kidney. Bilateral kidneys and adrenal glands are otherwise unremarkable in appearance. No hydroureteronephrosis. Urinary bladder is normal in appearance.  Stomach/Bowel: Normal appearance of the stomach. No pathologic dilatation of small bowel or colon. Numerous colonic diverticulae are noted, particularly in the descending colon and sigmoid colon, without surrounding inflammatory changes to suggest an acute diverticulitis at this time. Normal appendix.  Vascular/Lymphatic: Aortic atherosclerosis, without evidence of aneurysm or dissection in the abdominal or pelvic vasculature. Vascular findings and measurements pertinent to potential TAVR procedure, as  detailed below. No lymphadenopathy noted in the abdomen or pelvis.  Reproductive: Prostate gland and seminal vesicles are unremarkable in appearance.  Other: No significant volume of ascites.  No pneumoperitoneum.  Musculoskeletal: Status post PLIF from L4-S1. There are no aggressive appearing lytic or blastic lesions noted in the visualized portions of the skeleton.  VASCULAR MEASUREMENTS PERTINENT TO TAVR:  AORTA:  Minimal Aortic Diameter-14 x 18 mm  Severity of Aortic Calcification-severe  RIGHT PELVIS:  Right Common Iliac Artery -  Minimal Diameter-11.6 x 10.6 mm  Tortuosity-mild  Calcification-moderate  Right External Iliac Artery -  Minimal Diameter-8.1 x 6.3 mm  Tortuosity-severe  Calcification-none  Right Common Femoral Artery -  Minimal Diameter-8.6 x 8.4 mm  Tortuosity-mild  Calcification-mild-to-moderate  LEFT PELVIS:  Left Common Iliac Artery -  Minimal Diameter-11.4 x  12.1 mm  Tortuosity-mild  Calcification-moderate  Left External Iliac Artery -  Minimal Diameter-7.8 x 8.0 mm  Tortuosity-severe  Calcification-none  Left Common Femoral Artery -  Minimal Diameter-7.9 x 8.2 mm  Tortuosity-mild  Calcification-mild-to-moderate  Review of the MIP images confirms the above findings.  IMPRESSION: 1. Vascular findings and measurements pertinent to potential TAVR procedure, as detailed above. 2. Severe thickening calcification of the aortic valve, compatible with the reported clinical history of severe aortic stenosis. 3. Aortic atherosclerosis, in addition to 3 vessel coronary artery disease. 4. Cholelithiasis without evidence of acute cholecystitis at this time. 5. 2 mm nonobstructive calculus in the lower pole collecting system of the right kidney. No ureteral stones or findings of urinary tract obstruction. 6. Extensive colonic diverticulosis without evidence of acute diverticulitis at this time. 7. Additional incidental findings, as above.   Electronically Signed   By: Vinnie Langton M.D.   On: 08/27/2019 16:11   STS RISK CALCULATOR: Isolated AVR Risk of Mortality: 2.025% Renal Failure: 3.222% Permanent Stroke: 1.434% Prolonged Ventilation: 6.995% DSW Infection: 0.142% Reoperation: 3.805% Morbidity or Mortality: 11.553% Short Length of Stay: 22.256% Long Length of Stay: 7.421%   Impression:  This 85 year old gentleman has stage D, severe, symptomatic aortic stenosis with New York Heart Association class III symptoms of exertional fatigue and shortness of breath consistent with chronic diastolic congestive heart failure.  I have personally reviewed his 2D echocardiogram, cardiac catheterization, and CTA studies.  His echocardiogram shows moderate thickening aortic valve with severe calcification of the aortic valve with restricted mobility.  The mean gradient across aortic valve was measured at 58  mmHg consistent with critical aortic stenosis.  Left ventricular systolic function is normal.  He also has moderate mitral regurgitation with a degenerative mitral valve.  Cardiac catheterization showed no significant coronary disease.  Right heart catheterization to show severe pulmonary hypertension with elevated wedge pressure.  I agree that aortic valve replacement is indicated in this patient with progressive lifestyle limiting symptoms of severe aortic stenosis.  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  I think transcatheter aortic valve placement would be a reasonable alternative.  His gated cardiac CTA shows anatomy suitable for transcatheter aortic valve replacement.  His abdominal pelvic CTA shows adequate pelvic vascular anatomy to allow transfemoral insertion.  The patient and daughter were counseled at length regarding treatment alternatives for management of severe symptomatic aortic stenosis. The risks and benefits of surgical intervention has been discussed in detail. Long-term prognosis  with medical therapy was discussed. Alternative approaches such as conventional surgical aortic valve replacement, transcatheter aortic valve replacement, and palliative medical therapy were compared and contrasted at length. This discussion was placed in the context of the patient's own specific clinical presentation and past medical history. All of their questions have been addressed.   Following the decision to proceed with transcatheter aortic valve replacement, a discussion was held regarding what types of management strategies would be attempted intraoperatively in the event of life-threatening complications, including whether or not the patient would be  considered a candidate for the use of cardiopulmonary bypass and/or conversion to open sternotomy for attempted surgical intervention.  Given his advanced age and limited mobility I do not think he would be a candidate for emergent sternotomy to manage any intraoperative complications.  The patient has been advised of a variety of complications that might develop including but not limited to risks of death, stroke, paravalvular leak, aortic dissection or other major vascular complications, aortic annulus rupture, device embolization, cardiac rupture or perforation, mitral regurgitation, acute myocardial infarction, arrhythmia, heart block or bradycardia requiring permanent pacemaker placement, congestive heart failure, respiratory failure, renal failure, pneumonia, infection, other late complications related to structural valve deterioration or migration, or other complications that might ultimately cause a temporary or permanent loss of functional independence or other long term morbidity. The patient provides full informed consent for the procedure as described and all questions were answered.     Plan:  We will be scheduled for transfemoral transcatheter aortic valve replacement on Tuesday, 09/22/2019.  He has already stopped Eliquis for his steroid spinal injection and I told him to remain off Eliquis until after surgery.   I spent 60 minutes performing this consultation and > 50% of this time was spent face to face counseling and coordinating the care of this patient's severe symptomatic aortic stenosis.     Gaye Pollack, MD 09/16/2019

## 2019-09-17 NOTE — Pre-Procedure Instructions (Addendum)
[facility] [place], [place] [facility] [address] [place] [address] Phone: [phone] Fax: [phone]  [facility] [place], [place] [address] [place] [address] Phone: [phone] Fax: [phone]      Your procedure is scheduled on Tuesday, October 6th.  Report to [facility] Main Entrance "A" at 7:15 A.M., and check in at the Admitting office.  Call this number if you have problems the morning of surgery:  [phone]  Call [phone] if you have any questions prior to your surgery date Monday-Friday 8am-4pm    Remember:  Do not eat or drink after midnight the night before your surgery    Take these medicines the morning of surgery with A SIP OF WATER: NONE  Follow your surgeon's instructions on when to stop/resume Eliquis. If no instructions were given by your surgeon then you will need to call the office to get those instructions.    As of today, STOP taking any Aspirin (unless otherwise instructed by your surgeon), Aleve, Naproxen, Ibuprofen, Motrin, Advil, Goody's, BC's, all herbal medications, fish oil, and all vitamins.    The Morning of Surgery  Do not wear jewelry.  Do not wear lotions, powders, or colognes, or deodorant  Men may shave face and neck.  Do not bring valuables to the hospital.  [facility] is not responsible for any belongings or valuables.  If you are a smoker, DO NOT Smoke 24 hours prior to surgery IF you wear a CPAP at night please bring your mask, tubing, and machine the morning of surgery   Remember that you must have someone to transport you home after your surgery, and remain with you for 24 hours if you are discharged the same day.   Contacts, glasses, hearing aids, dentures or bridgework may not be worn into surgery.    Leave your suitcase in the car.  After surgery it may be brought to your room.  For  patients admitted to the hospital, discharge time will be determined by your treatment team.  Patients discharged the day of surgery will not be allowed to drive home.    Special instructions:   [facility]- Preparing For Surgery  Before surgery, you can play an important role. Because skin is not sterile, your skin needs to be as free of germs as possible. You can reduce the number of germs on your skin by washing with CHG (chlorahexidine gluconate) Soap before surgery.  CHG is an antiseptic cleaner which kills germs and bonds with the skin to continue killing germs even after washing.    Oral Hygiene is also important to reduce your risk of infection.  Remember - BRUSH YOUR TEETH THE MORNING OF SURGERY WITH YOUR REGULAR TOOTHPASTE  Please do not use if you have an allergy to CHG or antibacterial soaps. If your skin becomes reddened/irritated stop using the CHG.  Do not shave (including legs and underarms) for at least 48 hours prior to first CHG shower. It is OK to shave your face.  Please follow these instructions carefully.   1. Shower the NIGHT BEFORE SURGERY and the MORNING OF SURGERY with CHG Soap.   2. If you chose to wash your hair, wash your hair first as usual with your normal shampoo.  3. After you shampoo, rinse your hair and body thoroughly to remove the shampoo.  4. Use  CHG as you would any other liquid soap. You can apply CHG directly to the skin and wash gently with a scrungie or a clean washcloth.   5. Apply the CHG Soap to your body ONLY FROM THE NECK DOWN.  Do not use on open wounds or open sores. Avoid contact with your eyes, ears, mouth and genitals (private parts). Wash Face and genitals (private parts)  with your normal soap.   6. Wash thoroughly, paying special attention to the area where your surgery will be performed.  7. Thoroughly rinse your body with warm water from the neck down.  8. DO NOT shower/wash with your normal soap after using and rinsing off the  CHG Soap.  9. Pat yourself dry with a CLEAN TOWEL.  10. Wear CLEAN PAJAMAS to bed the night before surgery, wear comfortable clothes the morning of surgery  11. Place CLEAN SHEETS on your bed the night of your first shower and DO NOT SLEEP WITH PETS.    Day of Surgery:  Do not apply any deodorants/lotions. Please shower the morning of surgery with the CHG soap  Please wear clean clothes to the hospital/surgery center.   Remember to brush your teeth WITH YOUR REGULAR TOOTHPASTE.   Please read over the following fact sheets that you were given.

## 2019-09-18 ENCOUNTER — Other Ambulatory Visit (HOSPITAL_COMMUNITY)
Admission: RE | Admit: 2019-09-18 | Discharge: 2019-09-18 | Disposition: A | Discharge status: Home / Self Care | Payer: Medicare Other | Source: Ambulatory Visit | Attending: Cardiovascular Disease | Admitting: Cardiovascular Disease

## 2019-09-18 ENCOUNTER — Encounter (HOSPITAL_COMMUNITY): Payer: Self-pay

## 2019-09-18 ENCOUNTER — Other Ambulatory Visit: Payer: Self-pay

## 2019-09-18 ENCOUNTER — Ambulatory Visit (HOSPITAL_COMMUNITY)
Admission: RE | Admit: 2019-09-18 | Discharge: 2019-09-18 | Disposition: A | Discharge status: Home / Self Care | Payer: Medicare Other | Source: Ambulatory Visit | Attending: Cardiovascular Disease | Admitting: Cardiovascular Disease

## 2019-09-18 ENCOUNTER — Encounter (HOSPITAL_COMMUNITY)
Admission: RE | Admit: 2019-09-18 | Discharge: 2019-09-18 | Disposition: A | Discharge status: Home / Self Care | Payer: Medicare Other | Source: Ambulatory Visit | Attending: Cardiovascular Disease | Admitting: Cardiovascular Disease

## 2019-09-18 DIAGNOSIS — I350 Nonrheumatic aortic (valve) stenosis: Secondary | ICD-10-CM | POA: Diagnosis not present

## 2019-09-18 DIAGNOSIS — Z01818 Encounter for other preprocedural examination: Secondary | ICD-10-CM | POA: Insufficient documentation

## 2019-09-18 DIAGNOSIS — Z20828 Contact with and (suspected) exposure to other viral communicable diseases: Secondary | ICD-10-CM | POA: Insufficient documentation

## 2019-09-18 HISTORY — DX: Pneumonia, unspecified organism: J18.9

## 2019-09-18 LAB — COMPREHENSIVE METABOLIC PANEL
ALT: 22 U/L (ref 0–44)
AST: 24 U/L (ref 15–41)
Albumin: 4 g/dL (ref 3.5–5.0)
Alkaline Phosphatase: 68 U/L (ref 38–126)
Anion gap: 16 — ABNORMAL HIGH (ref 5–15)
BUN: 23 mg/dL (ref 8–23)
CO2: 18 mmol/L — ABNORMAL LOW (ref 22–32)
Calcium: 8.7 mg/dL — ABNORMAL LOW (ref 8.9–10.3)
Chloride: 103 mmol/L (ref 98–111)
Creatinine, Ser: 1.45 mg/dL — ABNORMAL HIGH (ref 0.61–1.24)
GFR calc Af Amer: 50 mL/min — ABNORMAL LOW (ref >60)
GFR calc non Af Amer: 43 mL/min — ABNORMAL LOW (ref >60)
Glucose, Bld: 376 mg/dL — ABNORMAL HIGH (ref 70–99)
Potassium: 3.7 mmol/L (ref 3.5–5.1)
Sodium: 137 mmol/L (ref 135–145)
Total Bilirubin: 0.9 mg/dL (ref 0.3–1.2)
Total Protein: 6.4 g/dL — ABNORMAL LOW (ref 6.5–8.1)

## 2019-09-18 LAB — URINALYSIS, ROUTINE W REFLEX MICROSCOPIC
Bacteria, UA: NONE SEEN
Bilirubin Urine: NEGATIVE
Glucose, UA: >=500 mg/dL — AB
Hgb urine dipstick: NEGATIVE
Ketones, ur: NEGATIVE mg/dL
Leukocytes,Ua: NEGATIVE
Nitrite: NEGATIVE
Protein, ur: NEGATIVE mg/dL
Specific Gravity, Urine: 1.008 (ref 1.005–1.030)
pH: 6 (ref 5.0–8.0)

## 2019-09-18 LAB — BRAIN NATRIURETIC PEPTIDE: B Natriuretic Peptide: 461.5 pg/mL — ABNORMAL HIGH (ref 0.0–100.0)

## 2019-09-18 LAB — BLOOD GAS, ARTERIAL
Acid-base deficit: 3.1 mmol/L — ABNORMAL HIGH (ref 0.0–2.0)
Bicarbonate: 20.6 mmol/L (ref 20.0–28.0)
Drawn by: 42180
FIO2: 21
O2 Saturation: 98.2 %
Patient temperature: 98.6
pCO2 arterial: 32.4 mmHg (ref 32.0–48.0)
pH, Arterial: 7.42 (ref 7.350–7.450)
pO2, Arterial: 111 mmHg — ABNORMAL HIGH (ref 83.0–108.0)

## 2019-09-18 LAB — TYPE AND SCREEN
ABO/RH(D): A POS
Antibody Screen: NEGATIVE

## 2019-09-18 LAB — SURGICAL PCR SCREEN
MRSA, PCR: NEGATIVE
Staphylococcus aureus: POSITIVE — AB

## 2019-09-18 LAB — PROTIME-INR
INR: 1.2 (ref 0.8–1.2)
Prothrombin Time: 15.3 seconds — ABNORMAL HIGH (ref 11.4–15.2)

## 2019-09-18 LAB — CBC
HCT: 39.4 % (ref 39.0–52.0)
Hemoglobin: 13.1 g/dL (ref 13.0–17.0)
MCH: 31.4 pg (ref 26.0–34.0)
MCHC: 33.2 g/dL (ref 30.0–36.0)
MCV: 94.5 fL (ref 80.0–100.0)
Platelets: 102 10*3/uL — ABNORMAL LOW (ref 150–400)
RBC: 4.17 MIL/uL — ABNORMAL LOW (ref 4.22–5.81)
RDW: 12.7 % (ref 11.5–15.5)
WBC: 14.9 10*3/uL — ABNORMAL HIGH (ref 4.0–10.5)
nRBC: 0 % (ref 0.0–0.2)

## 2019-09-18 LAB — ABO/RH: ABO/RH(D): A POS

## 2019-09-18 LAB — HEMOGLOBIN A1C
Hgb A1c MFr Bld: 7.7 % — ABNORMAL HIGH (ref 4.8–5.6)
Mean Plasma Glucose: 174.29 mg/dL

## 2019-09-18 LAB — APTT: aPTT: 27 seconds (ref 24–36)

## 2019-09-18 IMAGING — CR DG CHEST 2V
2 series · 2 of 2 positions shown · non-contrast
Comparison: Chest radiograph [DATE]

CLINICAL DATA: Pre-op exam; severe aortic stenosis. Hx of TIA,
pneumonia, hypertension, heart murmur, CHF, asthma, aortic aneurysm,
afib, right/left heart cath and coronary angiography([DATE]).
Former smoker([HX]).

EXAM:
CHEST - 2 VIEW

[w chest pa]
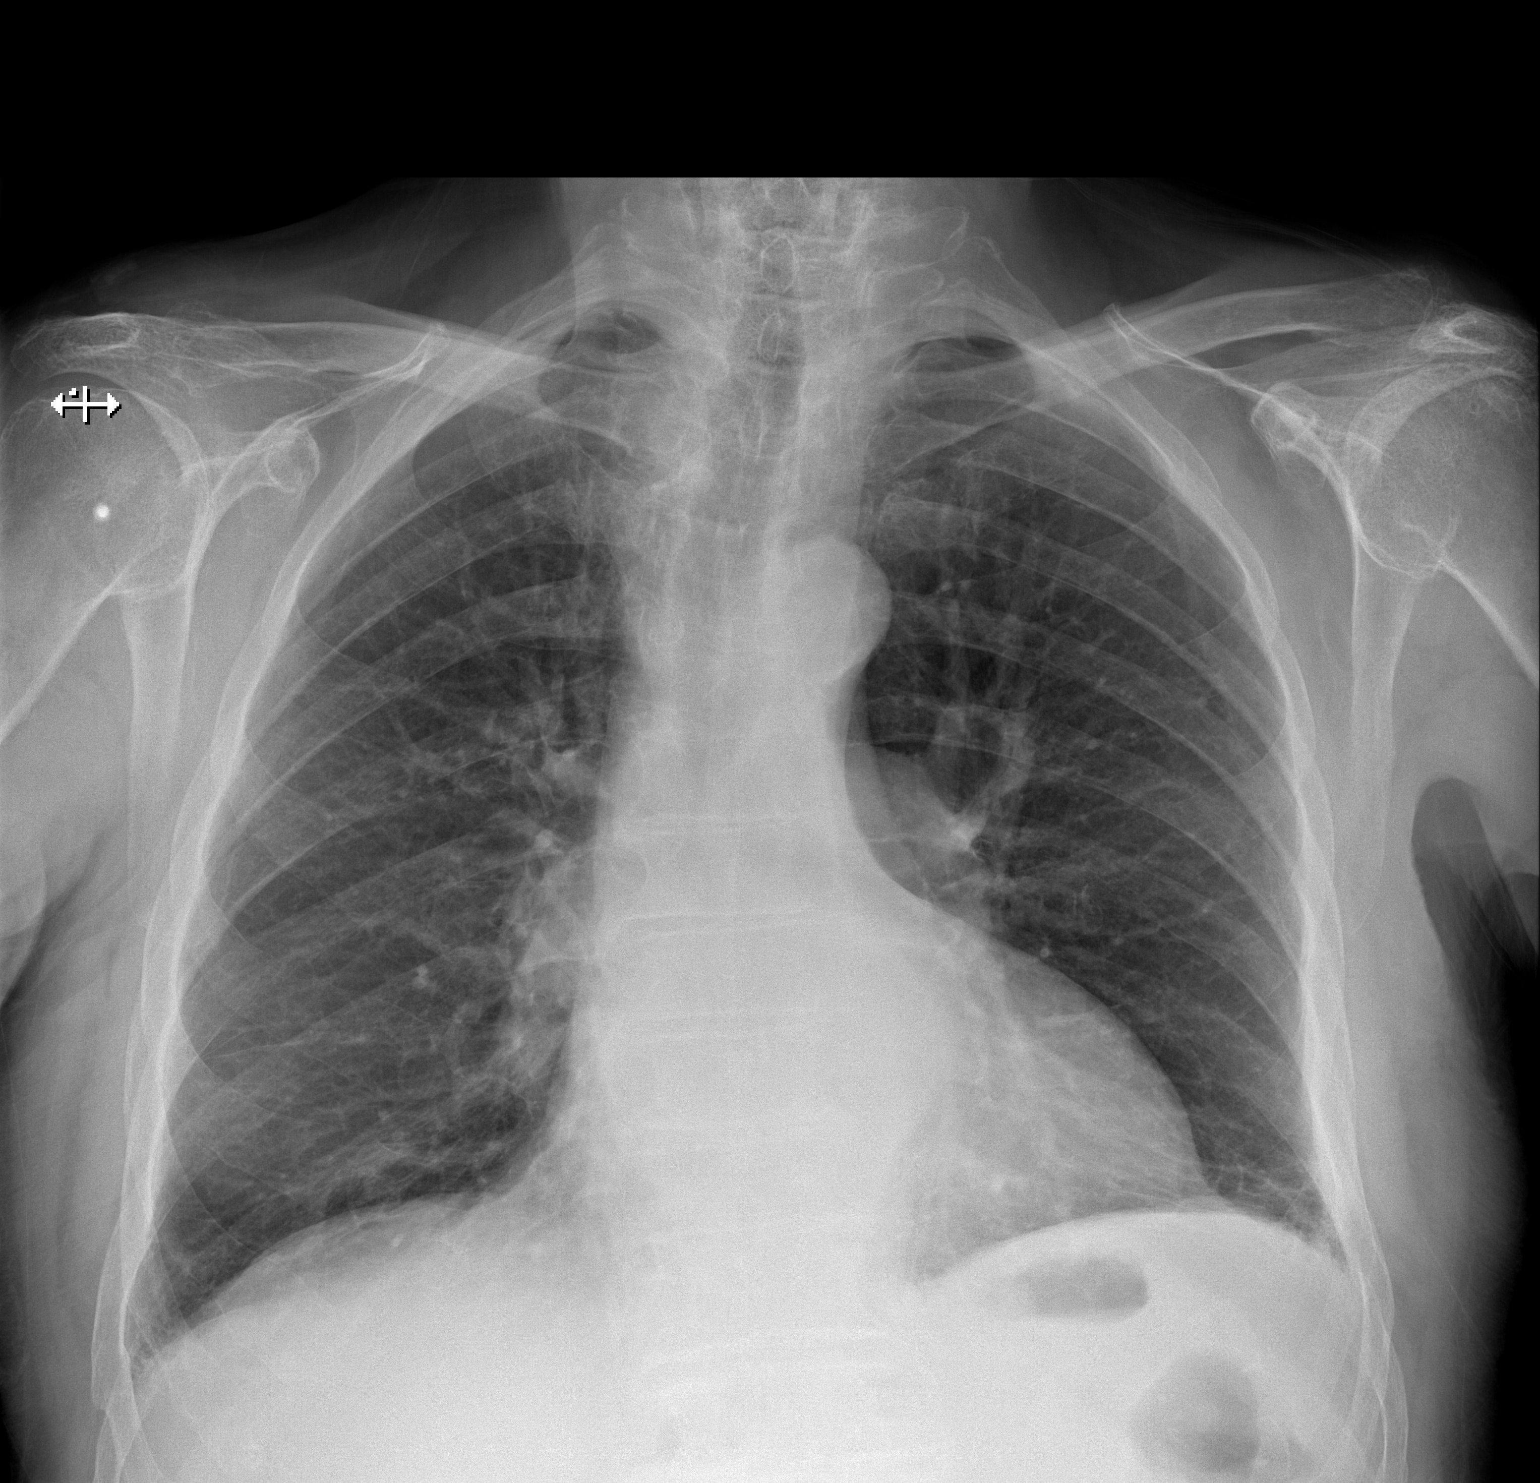

[w chest lat]
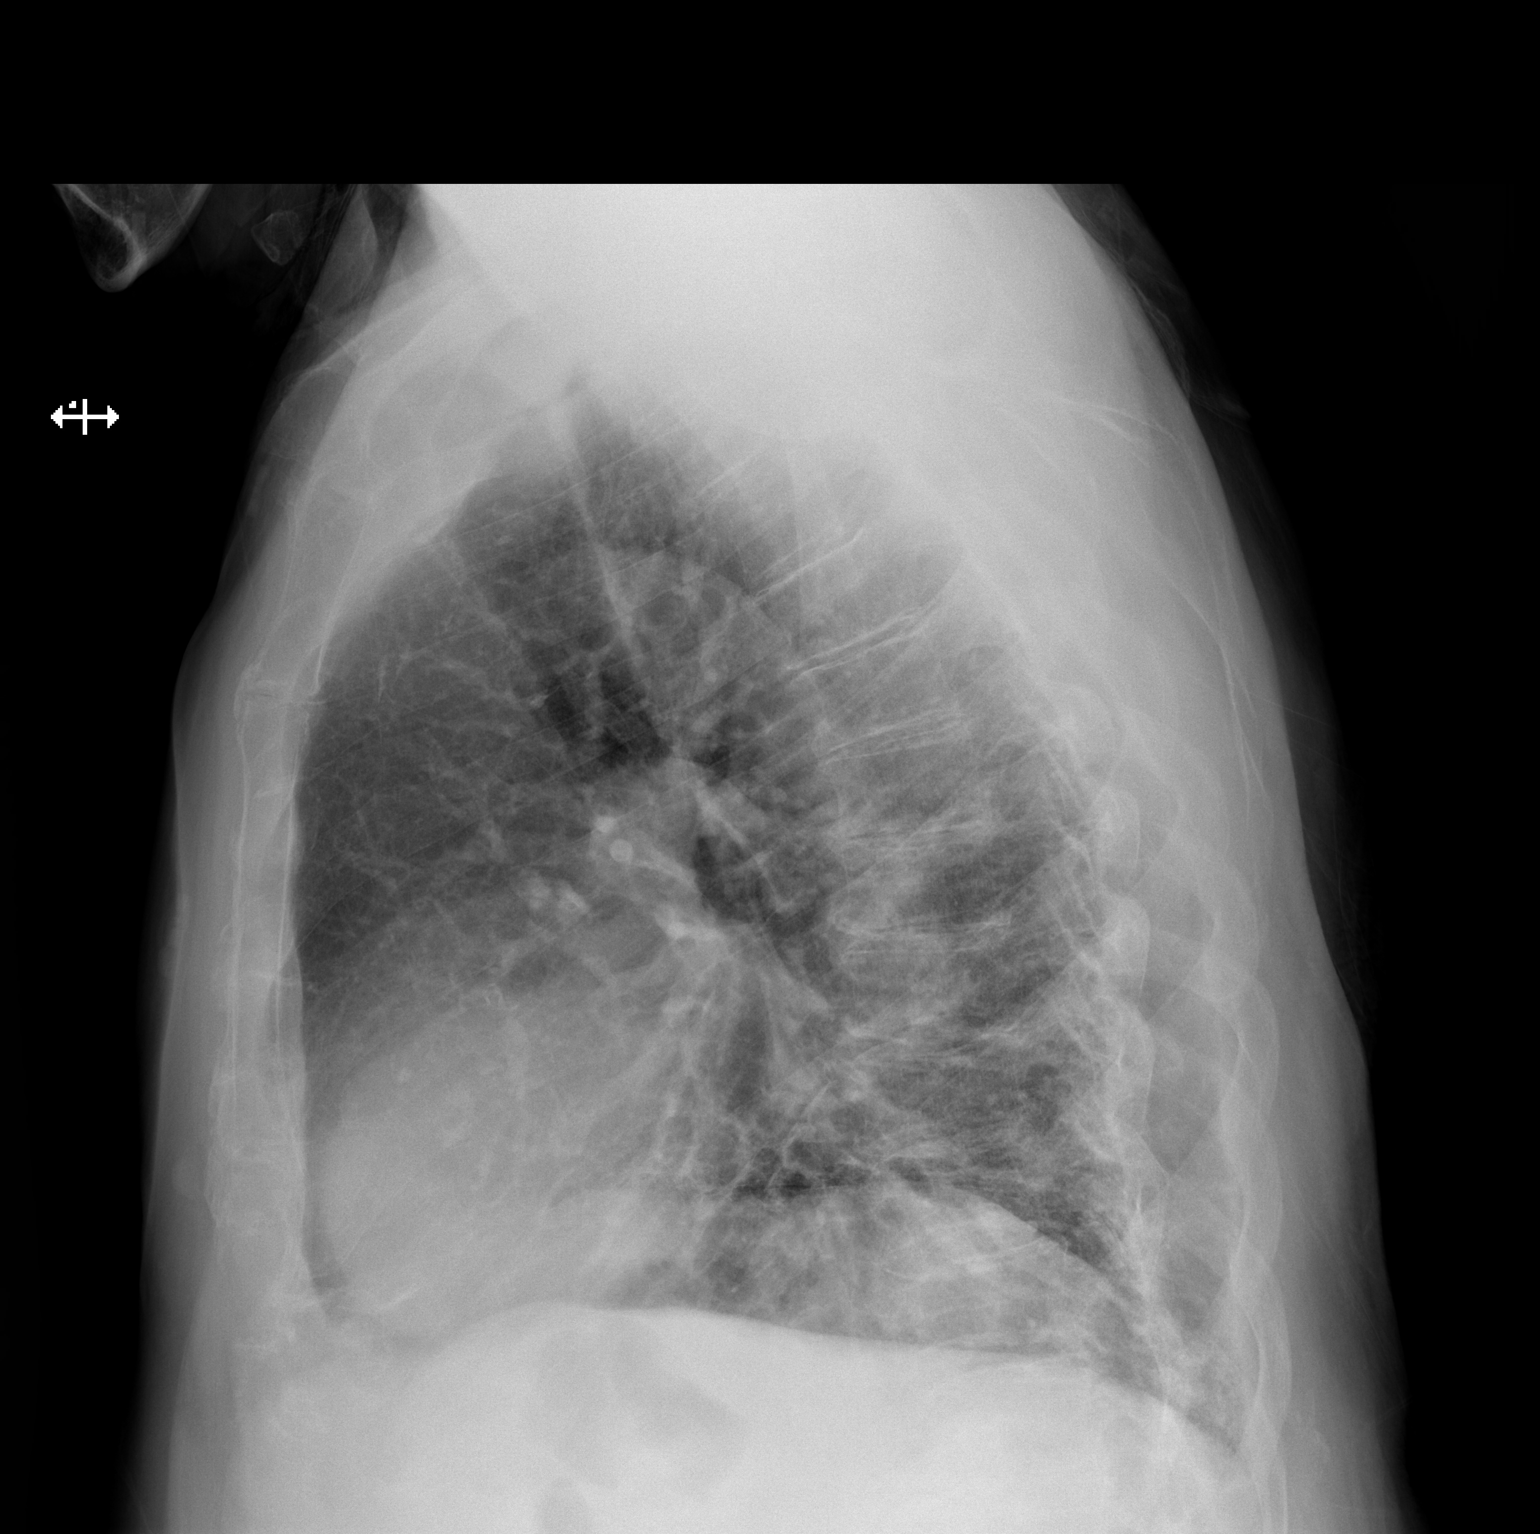

[2 of 2 positions shown; findings below may reference images not displayed]

FINDINGS: Stable cardiomediastinal contours with enlarged heart size. Minimal
bibasilar opacities likely reflect atelectasis or scarring. No new
focal pulmonary opacity. No pneumothorax or pleural effusion. No
acute finding in the visualized skeleton.
IMPRESSION: Minimal bibasilar scarring or atelectasis.  No new acute finding.

## 2019-09-18 NOTE — Progress Notes (Addendum)
Coronavirus Screening Testing today Have you experienced the following symptoms:  Cough yes/no: No Fever (>100.101F)  yes/no: No Runny nose yes/no: No Sore throat yes/no: No Difficulty breathing/shortness of breath  yes/no: No Loss of smell or taste-No Have you or a family member traveled in the last 14 days and where? yes/no: No  PCP - Dr Kym Groom, [place]  Cardiologist - Dr Fletcher Anon, [place]  Oncology-Dr [place]  Chest x-ray - today  EKG - today  Stress Test -denies  ECHO - -07-06-19 epic  Cardiac Cath - 08-10-19 epic  AICD-denies PM-denies LOOP-denies  Sleep Study - denies CPAP - NA  LABS-PCR,CBC,CMP<PT-INR,APTT  ELIQUIS- LD 09/14/19 ASA-denies  ERAS-NA  HA1C-No. Pt denies being a diabetic. Fasting Blood Sugar -  Checks Blood Sugar _____ times a day  Anesthesia-Y. CHF, Afib, AS. Pts daughter Marlaine Hind , answering questions for the patient. Pt hard of hearing and  forgetful.  Pt denies having chest pain, sob, or fever at this time. All instructions explained to the pt, with a verbal understanding of the material. Pt agrees to go over the instructions while at home for a better understanding. Pt also instructed to self quarantine after being tested for COVID-19. The opportunity to ask questions was provided. Both pt and daughter denied being a diabetic. In [place], A1C in March was 6.7. Repeat  A1C today was 7.7. with Gl=376 in CMP panel. Jeneen Rinks, [place] notified. Message sent to Dr Burt Knack.

## 2019-09-20 LAB — NOVEL CORONAVIRUS, NAA (HOSP ORDER, SEND-OUT TO REF LAB; TAT 18-24 HRS): SARS-CoV-2, NAA: NOT DETECTED

## 2019-09-21 MED ORDER — POTASSIUM CHLORIDE 2 MEQ/ML IV SOLN
80.0000 meq | INTRAVENOUS | Status: DC
  Filled 2019-09-21 (×2): qty 40

## 2019-09-21 MED ORDER — LEVOFLOXACIN IN D5W 500 MG/100ML IV SOLN
500.0000 mg | INTRAVENOUS | Status: AC
  Administered 2019-09-22: 11:00:00 500 mg via INTRAVENOUS
  Filled 2019-09-21 (×2): qty 100

## 2019-09-21 MED ORDER — NOREPINEPHRINE 4 MG/250ML-% IV SOLN
0.0000 ug/min | INTRAVENOUS | Status: AC
  Administered 2019-09-22: 11:00:00 1 ug/min via INTRAVENOUS
  Filled 2019-09-21: qty 250

## 2019-09-21 MED ORDER — SODIUM CHLORIDE 0.9 % IV SOLN
INTRAVENOUS | Status: DC
  Filled 2019-09-21 (×2): qty 30

## 2019-09-21 MED ORDER — DEXMEDETOMIDINE HCL IN NACL 400 MCG/100ML IV SOLN
0.1000 ug/kg/h | INTRAVENOUS | Status: AC
  Administered 2019-09-22: 1 ug/kg/h via INTRAVENOUS
  Filled 2019-09-21 (×2): qty 100

## 2019-09-21 MED ORDER — VANCOMYCIN HCL 10 G IV SOLR
1500.0000 mg | INTRAVENOUS | Status: AC
  Administered 2019-09-22: 11:00:00 1500 mg via INTRAVENOUS
  Filled 2019-09-21 (×2): qty 1500

## 2019-09-21 MED ORDER — MAGNESIUM SULFATE 50 % IJ SOLN
40.0000 meq | INTRAMUSCULAR | Status: DC
  Filled 2019-09-21: qty 9.85

## 2019-09-21 NOTE — H&P (Signed)
[address]       [place] [address]             [phone]      Cardiothoracic Surgery Admission History and Physical   Referring Provider is Wellington Hampshire, MD  Primary Cardiologist is No primary care provider on file.  PCP is Olmedo, Guy Begin, MD      Chief Complaint  Patient presents with   Aortic Stenosis      HPI:  The patient is an 85 year old gentleman with a history of hypertension, B-cell lymphoma treated with chemotherapy, atrial fibrillation, aortic stenosis, and congestive heart failure who reports being short of breath of several years. He said that this has continued to progress and he is now short of breath walking around in his house, bending over to put on his shoes, or getting dressed. He has had no chest pain or pressure. Does report some episodes of dizziness in the past but has never had any syncope. He denies orthopnea and PND. His most recent echocardiogram on 07/06/2019 showed a mean gradient across the aortic valve of 58 mmHg with a peak gradient of 72 mmHg. Aortic valve area was 0.63 cm. Left ventricular systolic function was normal at 55 to 60%. There is degenerative mitral valve disease with moderate mitral regurgitation. Cardiac catheterization on 08/10/2019 which showed minimal nonobstructive coronary disease. The aortic valve was not crossed. Right heart catheterization did show severe pulmonary hypertension with a PA pressure of 74/30 with a mean of 50. Cardiac index was 2.09. Pulmonary wedge pressure was 33 mm with prominent V waves suggesting mitral rotation.  The patient reports that about 6 weeks ago he developed significant weakness and pain in the right lower extremity which he never had before. He has multiple falls due to his right leg giving out. He has been using a walker for stabilization. His daughter came to the office with him and said that he was seen at [facility] and told that he was not a candidate for any  spine surgery it was felt that his symptoms are most likely due to his spine. He is scheduled to have a steroid injection in his spine later this week.  The patient is retired and lives by himself at home. He has 5 children and they alternate staying with him at night. He has a caretaker that comes in during the day.       Past Medical History:  Diagnosis Date   A-fib [facility])    Aortic aneurysm (HCC)    Aortic stenosis    Asthma    CHF (congestive heart failure) (HCC)    Hammertoe    HTN (hypertension)    TIA (transient ischemic attack)         Past Surgical History:  Procedure Laterality Date   BACK SURGERY  12/89   CATARACT EXTRACTION W/ INTRAOCULAR LENS IMPLANT Right 12/00   CATARACT EXTRACTION W/ INTRAOCULAR LENS IMPLANT Left 8/01   foot sugery Right 6/03   FOOT SURGERY Left 1/96   hammertoe   KIDNEY STONE SURGERY  7/90   RIGHT/LEFT HEART CATH AND CORONARY ANGIOGRAPHY N/A 08/10/2019   Procedure: RIGHT/LEFT HEART CATH AND CORONARY ANGIOGRAPHY; Surgeon: Wellington Hampshire, MD; Location: [place] [facility]; Service: Cardiovascular; Laterality: N/A;   ROTATOR CUFF REPAIR Right 1/98   TOTAL KNEE ARTHROPLASTY Bilateral 1987        Family History  Problem Relation Age of Onset   Heart disease  Father    Social History        Socioeconomic History   Marital status: Widowed    Spouse name: Not on file   Number of children: 5   Years of education: Not on file   Highest education level: Not on file  Occupational History   Occupation: Retired   Scientist, product/process development strain: Not hard at [employer]    Worry: Never true    Inability: Never true   Transportation needs    Medical: No    Non-medical: No  Tobacco Use   Smoking status: Former Smoker   Smokeless tobacco: Never Used   Tobacco comment: quit 1989  Substance and Sexual Activity   Alcohol use: No   Drug use: No   Sexual activity: Not on file  Lifestyle    Physical activity    Days per week: Not on file    Minutes per session: Not on file   Stress: Only a little  Relationships   Social connections    Talks on phone: More than three times a week    Gets together: Not on file    Attends religious service: Not on file    Active member of club or organization: Not on file    Attends meetings of clubs or organizations: Not on file    Relationship status: Not on file   Intimate partner violence    Fear of current or ex partner: No    Emotionally abused: No    Physically abused: No    Forced sexual activity: No  Other Topics Concern   Not on file  Social History Narrative   Not on file         Current Outpatient Medications  Medication Sig Dispense Refill   acetaminophen (TYLENOL) 500 MG tablet Take 1,000 mg by mouth every 6 (six) hours as needed for mild pain, moderate pain or fever.     apixaban (ELIQUIS) 2.5 MG TABS tablet Take 1 tablet (2.5 mg total) by mouth 2 (two) times daily.     atorvastatin (LIPITOR) 40 MG tablet Take 40 mg by mouth daily.     azelastine (ASTELIN) 0.1 % nasal spray Place 1 spray into both nostrils 2 (two) times daily as needed for rhinitis or allergies. Use in each nostril as directed     cetirizine (ZYRTEC) 10 MG tablet Take 10 mg by mouth daily.      famotidine (PEPCID) 10 MG tablet Take 10 mg by mouth daily as needed for heartburn or indigestion.     fluticasone (FLOVENT HFA) 110 MCG/ACT inhaler Inhale 1 puff into the lungs 2 (two) times daily as needed (respiratory symptoms).      furosemide (LASIX) 40 MG tablet Take 1 tablet (40 mg total) by mouth daily. (Patient taking differently: Take 20 mg by mouth daily. ) 30 tablet 3   gabapentin (NEURONTIN) 100 MG capsule Take 1 capsule (100 mg total) by mouth 3 (three) times daily as needed (back pain). (Patient taking differently: Take 300 mg by mouth 3 (three) times daily. ) 30 capsule 0   guaiFENesin (MUCINEX) 600 MG 12 hr tablet Take 600 mg by mouth  2 (two) times daily as needed for cough or to loosen phlegm.     Ipratropium-Albuterol (COMBIVENT) 20-100 MCG/ACT AERS respimat Inhale 1 puff into the lungs every 6 (six) hours. (Patient taking differently: Inhale 1 puff into the lungs every 6 (six) hours as needed for wheezing or  shortness of breath. ) 4 g 0   lactulose (CHRONULAC) 10 GM/15ML solution Take 20 g by mouth daily as needed for moderate constipation.      meclizine (ANTIVERT) 12.5 MG tablet Take 12.5 mg by mouth 3 (three) times daily as needed for dizziness.     metoprolol succinate (TOPROL-XL) 50 MG 24 hr tablet Take 1 tablet (50 mg total) by mouth daily. Take with or immediately following a meal. 30 tablet 0   mirtazapine (REMERON) 7.5 MG tablet Take 7.5 mg by mouth at bedtime.     omeprazole (PRILOSEC) 20 MG capsule Take 20 mg by mouth daily as needed (stomach acid).     ondansetron (ZOFRAN-ODT) 8 MG disintegrating tablet Take 8 mg by mouth every 8 (eight) hours as needed for nausea or vomiting.     polyethylene glycol (MIRALAX) 17 g packet Take 17 g by mouth daily as needed for moderate constipation. 14 each 0   tamsulosin (FLOMAX) 0.4 MG CAPS capsule Take 1 capsule (0.4 mg total) by mouth daily after supper. 90 capsule 3   No current facility-administered medications for this visit.         Allergies  Allergen Reactions   Penicillins Rash    Did it involve swelling of the face/tongue/throat, SOB, or low BP? Unknown  Did it involve sudden or severe rash/hives, skin peeling, or any reaction on the inside of your mouth or nose? Unknown  Did you need to seek medical attention at a hospital or doctor's office? Unknown  When did it last happen? Unknown  If all above answers are NO, may proceed with cephalosporin use.    Review of Systems:   General: + decreased appetite, + decreased energy, + weight gain, no weight loss, no fever  Cardiac: no chest pain with exertion, no chest pain at rest, +SOB with mild exertion,  no resting SOB, no PND, no orthopnea, + palpitations, + arrhythmia, + atrial fibrillation, + LE edema, + dizzy spells, no syncope  Respiratory: + exertional shortness of breath, no home oxygen, + productive cough, no dry cough, no bronchitis, + wheezing, no hemoptysis, no asthma, no pain with inspiration or cough, no sleep apnea, no CPAP at night  GI: No difficulty swallowing, no reflux, + frequent heartburn, no hiatal hernia, no abdominal pain, no constipation, no diarrhea, no hematochezia, no hematemesis, no melena  GU: no dysuria, + frequency, no urinary tract infection, no hematuria, + enlarged prostate, no kidney stones, + kidney disease  Vascular: no pain suggestive of claudication, + pain in feet, no leg cramps, no varicose veins, no DVT, no non-healing foot ulcer  Neuro: no stroke, + TIA's, no seizures, no headaches, no temporary blindness one eye, no slurred speech, no peripheral neuropathy, no chronic pain, + instability of gait, + memory/cognitive dysfunction  Musculoskeletal: + arthritis, no joint swelling, + myalgias, + difficulty walking, + reduced mobility  Skin: no rash, no itching, no skin infections, no pressure sores or ulcerations  Psych: + anxiety, no depression, no nervousness, no unusual recent stress  Eyes: no blurry vision, no floaters, no recent vision changes, + wears glasses or contacts  ENT: + hearing loss, no loose or painful teeth, + upper dentures, last saw dentist every 6 months  Hematologic: + easy bruising, no abnormal bleeding, no clotting disorder, no frequent epistaxis  Endocrine: + diabetes, does not check CBG's at home   Physical Exam:   BP 122/81 (BP Location: Left Arm, Patient Position: Sitting, Cuff Size: Normal)   Pulse  92   Temp (!) 97.5 F (36.4 C)   Resp 18   Ht 5\' 9"  (1.753 m)   Wt 192 lb (87.1 kg)   SpO2 93% Comment: RA   BMI 28.35 kg/m  General: Elderly, somewhat frail, well-appearing  HEENT: Unremarkable, [place]/AT, PERLA, EOMI, oropharynx clear    Neck: no JVD, no bruits, no adenopathy or thyromegaly  Chest: clear to auscultation, symmetrical breath sounds, no wheezes, no rhonchi  CV: RRR, grade III/VI crescendo/decrescendo murmur heard best at RSB, no diastolic murmur  Abdomen: soft, non-tender, no masses or organomegaly  Extremities: warm, well-perfused, pulses not palpable in feet, moderate bilateral LE edema  Rectal/GU Deferred  Neuro: Grossly non-focal and symmetrical throughout  Skin: Clean and dry, no rashes, no breakdown    Diagnostic Tests:   ECHOCARDIOGRAM REPORT  Patient Name: VINCE FEEZOR Date of Exam: 07/06/2019  Medical Rec #: [id number] Height: 69.0 in  Accession #: [id number] Weight: 191.2 lb  Date of Birth: [birth year] BSA: 2.03 m  Patient Age: 73 years BP: 145/85 mmHg  Patient Gender: M HR: 84 bpm.  Exam Location: [facility]  Procedure: 2D Echo, Cardiac Doppler and Color Doppler  Indications: Aortic stenosis 424.1  History: Patient has prior history of Echocardiogram examinations, most  recent 02/25/2019. CHF TIA Atrial Fibrillation Risk Factors:  Hypertension. Aortic stenosis.  Sonographer: Sherrie Sport RDCS (AE)  Referring Phys: [id number] Valetta Fuller DODD MAYO  Diagnosing Phys: Nelva Bush MD  IMPRESSIONS  1. The left ventricle has normal systolic function, with an ejection fraction of 55-60%. The cavity size was normal. There is moderately increased left ventricular wall thickness. Left ventricular diastolic function could not be evaluated.  2. The right ventricle has normal systolic function. The cavity was mildly enlarged. There is no increase in right ventricular wall thickness. Right ventricular systolic pressure is severely elevated with an estimated pressure of 71.9 mmHg.  3. Left atrial size was mildly dilated.  4. Right atrial size was moderately dilated.  5. The mitral valve is degenerative. Mild thickening of the mitral valve leaflet. Moderate calcification of the mitral valve leaflet. Mitral valve  regurgitation is moderate by color flow Doppler.  6. The tricuspid valve is not well visualized. Tricuspid valve regurgitation is mild-moderate.  7. The aortic valve was not well visualized. Moderate thickening of the aortic valve. Aortic valve regurgitation is mild by color flow Doppler. Severe stenosis of the aortic valve.  8. The aortic root is normal in size and structure.  FINDINGS  Left Ventricle: The left ventricle has normal systolic function, with an ejection fraction of 55-60%. The cavity size was normal. There is moderately increased left ventricular wall thickness. Left ventricular diastolic function could not be evaluated.  Right Ventricle: The right ventricle has normal systolic function. The cavity was mildly enlarged. There is no increase in right ventricular wall thickness. Right ventricular systolic pressure is severely elevated with an estimated pressure of 71.9 mmHg.  Left Atrium: Left atrial size was mildly dilated.  Right Atrium: Right atrial size was moderately dilated. Right atrial pressure is estimated at 3 mmHg.  Interatrial Septum: No atrial level shunt detected by color flow Doppler.  Pericardium: There is no evidence of pericardial effusion.  Mitral Valve: The mitral valve is degenerative in appearance. Mild thickening of the mitral valve leaflet. Moderate calcification of the mitral valve leaflet. Mitral valve regurgitation is moderate by color flow Doppler.  Tricuspid Valve: The tricuspid valve is not well visualized. Tricuspid valve regurgitation is mild-moderate by color flow Doppler.  Aortic Valve: The aortic valve was not well visualized Moderate thickening of the aortic valve. Aortic valve regurgitation is mild by color flow Doppler. There is Severe stenosis of the aortic valve, with a calculated valve area of 0.69 cm.  Pulmonic Valve: The pulmonic valve was not well visualized. Pulmonic valve regurgitation is not visualized by color flow Doppler. No evidence of  pulmonic stenosis.  Aorta: The aortic root is normal in size and structure.  Pulmonary Artery: The pulmonary artery is not well seen.  Venous: The inferior vena cava measures 1.90 cm, is normal in size with greater than 50% respiratory variability.  +--------------+--------++   LEFT VENTRICLE     +--------------+---------++  +--------------+--------++  Diastology        PLAX 2D      +--------------+---------++  +--------------+--------++  LV e' lateral: 7.83 cm/s     LVIDd:  4.18 cm    +--------------+---------++  +--------------+--------++  LV e' medial:  7.40 cm/s     LVIDs:  2.99 cm    +--------------+---------++  +--------------+--------++   LV PW:  1.31 cm     +--------------+--------++   LV IVS:  1.51 cm     +--------------+--------++   LVOT diam:  2.10 cm     +--------------+--------++   LV SV:  43 ml     +--------------+--------++   LV SV Index:  20.73     +--------------+--------++   LVOT Area:  3.46 cm    +--------------+--------++          +--------------+--------++  +---------------+----------++   RIGHT VENTRICLE      +---------------+----------++   RV Basal diam:  3.85 cm     +---------------+----------++   RV S prime:  13.20 cm/s    +---------------+----------++   TAPSE (M-mode): 3.9 cm     +---------------+----------++  +---------------+-------++-----------++   LEFT ATRIUM     Index     +---------------+-------++-----------++   LA diam:  4.30 cm  2.12 cm/m     +---------------+-------++-----------++   LA Vol (A2C):  98.9 ml  48.79 ml/m    +---------------+-------++-----------++   LA Vol (A4C):  56.5 ml  27.88 ml/m    +---------------+-------++-----------++   LA Biplane Vol: 75.0 ml  37.00 ml/m    +---------------+-------++-----------++  +------------+---------++-----------++   RIGHT ATRIUM    Index     +------------+---------++-----------++   RA Area:  27.40 cm       +------------+---------++-----------++   RA Volume:  89.70 ml   44.26 ml/m      +------------+---------++-----------++  +------------------+------------++ +---------------+--------++   AORTIC VALVE       PULMONIC VALVE       +------------------+------------++ +---------------+--------++   AV Area (Vmax):  0.63 cm     PV Vmax:  0.87 m/s    +------------------+------------++ +---------------+--------++   AV Area (Vmean):  0.56 cm     PV Peak grad:  3.0 mmHg    +------------------+------------++ +---------------+--------++   AV Area (VTI):  0.69 cm     RVOT Peak grad: 2 mmHg     +------------------+------------++ +---------------+--------++   AV Vmax:  424.00 cm/s     +------------------+------------++   AV Vmean:  304.667 cm/s    +------------------+------------++   AV VTI:  0.839 m     +------------------+------------++   AV Peak Grad:  71.9 mmHg     +------------------+------------++   AV Mean Grad:  58.0 mmHg     +------------------+------------++   LVOT Vmax:  76.90 cm/s     +------------------+------------++   LVOT Vmean:  49.200 cm/s     +------------------+------------++  LVOT VTI:  0.168 m     +------------------+------------++   LVOT/AV VTI ratio: 0.20     +------------------+------------++  +-------------+-------++   AORTA       +-------------+-------++   Ao Root diam: 2.90 cm    +-------------+-------++  +---------------+-----------++   TRICUSPID VALVE      +---------------+-----------++   TR Peak grad:  68.9 mmHg     +---------------+-----------++   TR Vmax:  415.00 cm/s    +---------------+-----------++  +--------------+-------+   SHUNTS      +--------------+-------+   Systemic VTI:  0.17 m    +--------------+-------+   Systemic Diam: 2.10 cm   +--------------+-------+  +---------+-------+   IVC      +---------+-------+   IVC diam: 1.90 cm   +---------+-------+  Nelva Bush MD  Electronically signed by Nelva Bush MD  Signature Date/Time: 07/06/2019/12:35:18 PM  Physicians  Panel Physicians Referring Physician Case  Authorizing Physician  Wellington Hampshire, MD (Primary)    Procedures  RIGHT/LEFT HEART CATH AND CORONARY ANGIOGRAPHY  Conclusion  1. Minimal nonobstructive coronary artery disease.  2. Severe aortic stenosis by echo. I did not attempt to cross the aortic valve which was noted to be heavily calcified with restricted opening on fluoroscopy.  3. Right heart catheterization showed severely elevated filling pressures, severe pulmonary hypertension and mildly reduced cardiac output. Pulmonary wedge pressure was 33 mmHg with prominent V waves suggestive of mitral regurgitation. PA pressure was 74/30 with a mean of 50 mmHg. Cardiac output was 4.15 with a cardiac index of 2.09.  Recommendations:  I am going to resume furosemide and increase the dose to 40 mg once daily.  Continue evaluation for TAVR.  Given underlying chronic kidney disease and heart failure, consider hospital admission before TAVR for diuresis and monitoring of renal function.  Indications  Severe aortic stenosis [I35.0 (ICD-10-CM)]  Procedural Details  Technical Details Procedural Details: The pre-existing IV in the right antecubital vein was exchanged under sterile fashion to a slender sheath. The right wrist was prepped, draped, and anesthetized with 1% lidocaine. Using the modified Seldinger technique, a 5 French sheath was introduced into the right radial artery. 2.5 mg of verapamil was administered through the sheath, weight-based unfractionated heparin was administered intravenously. Right heart catheterization was performed using a 5 French Swan-Ganz catheter. Cardiac output was calculated by the Fick method. A Jackie catheter was used for selective coronary angiography. A JR4 catheter was needed to engage the right coronary artery. I did not attempt to cross the aortic valve. Catheter exchanges were performed over an exchange length guidewire. There were no immediate procedural complications. A TR band was used for radial hemostasis  at the completion of the procedure. The patient was transferred to the post catheterization recovery area for further monitoring.  Estimated blood loss <50 mL.   During this procedure no sedation was administered.  Medications  (Filter: Administrations occurring from 08/10/19 1040 to 08/10/19 1152)          (important) Continuous medications are totaled by the amount administered until 08/10/19 1152.  Medication Rate/Dose/Volume Action  Date Time   verapamil (ISOPTIN) injection (mg) 2.5 mg Given 08/10/19 1112   Total dose as of 08/10/19 1152        2.5 mg        heparin injection (Units) 4,000 Units Given 08/10/19 1124   Total dose as of 08/10/19 1152        4,000 Units        Heparin (Porcine) in NaCl  1000-0.9 UT/500ML-% SOLN (mL) 500 mL Given 08/10/19 1129   Total dose as of 08/10/19 1152        500 mL        iohexol (OMNIPAQUE) 300 MG/ML solution (mL) 70 mL Given 08/10/19 1137   Total dose as of 08/10/19 1152        70 mL        Contrast  Medication Name Total Dose  iohexol (OMNIPAQUE) 300 MG/ML solution 70 mL  Radiation/Fluoro  Fluoro time: 9.4 (min)  DAP: 34 (Gycm2)  Cumulative Air Kerma: 455 (mGy)  Coronary Findings  Diagnostic  Dominance: Right  Left Main  Vessel is angiographically normal.  Left Anterior Descending  The vessel exhibits minimal luminal irregularities.  Left Circumflex  The vessel exhibits minimal luminal irregularities.  Right Coronary Artery  The vessel exhibits minimal luminal irregularities.  Intervention  No interventions have been documented.  Coronary Diagrams  Diagnostic  Dominance: Right   Intervention  Implants     No implant documentation for this case.  Syngo Images  Link to Procedure Log   Show images for CARDIAC CATHETERIZATION Procedure Log  Images on Long Term Storage    Show images for Feng, Devich Data (last day) before discharge   AO Systolic Cath Pressure  AO Diastolic Cath Pressure  AO Mean Cath Pressure  PA  Systolic Cath Pressure  PA Diastolic Cath Pressure  PA Mean Cath Pressure  RA Wedge A Wave  RA Wedge V Wave  RV Systolic Cath Pressure  RV Diastolic Cath Pressure  RV End Diastolic  PCW A Wave  PCW V Wave  PCW Mean  AO O2 Sat  PA O2 Sat  AO O2 Sat  Fick C.O.  Fick C.I.   --  --  --  --  --  --  13 mmHg  11 mmHg  --  --  --  31 mmHg  46 mmHg  33 mmHg  --  --  --  4.15 L/min  2.09 L/min/m2   --  --  --  --  --  --  --  --  72 mmHg  7 mmHg  11 mmHg  --  --  --  --  --  --  --  --   --  --  --  74 mmHg  30 mmHg  50 mmHg  --  --  --  --  --  --  --  --  --  --  --  --  --   --  --  --  --  --  --  --  --  --  --  --  --  --  --  99 %  --  SA  --  --   --  --  --  --  --  --  --  --  --  --  --  --  --  --  --  MV  --  --  --   141  75 mmHg  104 mmHg  --  --  --  --  --  --  --  --  --  --  --  --  --  --  --  --   153  82 mmHg  113 mmHg                                   ADDENDUM REPORT: 08/27/2019 13:22  ADDENDUM:  Extracardiac findings will be described separately under dictation  for contemporaneously obtained CTA chest, abdomen and pelvis  08/27/2019, please see that dictation for full details.  Electronically Signed  By: Vinnie Langton M.D.  On: 08/27/2019 13:22   Addended by Etheleen Mayhew, MD on 08/27/2019 1:25 PM  Study Result   CLINICAL DATA: Aortic Stenosis  EXAM:  Cardiac TAVR CT  TECHNIQUE:  The patient was scanned on a Siemens Force [id number] slice scanner. A 120  kV retrospective scan was triggered in the ascending thoracic aorta  at 140 HU's. Gantry rotation speed was 250 msecs and collimation was  .6 mm. No beta blockade or nitro were given. The 3D data set was  reconstructed in 5% intervals of the R-R cycle. Systolic and  diastolic phases were analyzed on a dedicated work station using  MPR, MIP and VRT modes. The patient received 80 cc of contrast.  FINDINGS:  Aortic Valve: Tri leaflet with severely thickened leaflet tips and  calcification and restricted leaflet motion   Aorta: Mild root dilatation Normal arch vessels mild calcific  atherosclerosis  Sino-tubular Junction: 32 mm  Ascending Thoracic Aorta: 38 mm  Aortic Arch: 32 mm  Descending Thoracic Aorta: 22 mm  Sinus of Valsalva Measurements:  Non-coronary: 34.5 mm  Right - coronary: 32.6 mm  Left - coronary: 34 mm  Coronary Artery Height above Annulus:  Left Main: 16.7 mm above annulus  Right Coronary: 16.8 mm above annulus  Virtual Basal Annulus Measurements:  Maximum / Minimum Diameter: 29.1 mm x 25.7 mm  Perimeter: 87 mm  Area: 597 mm2  Coronary Arteries: Sufficient height above annulus for deployment  Optimum Fluoroscopic Angle for Delivery: LAO 3 Caudal 16 degrees  IMPRESSION:  1. Tri leaflet AV with annular area of 597 mm2 suitable for a 29 mm  Sapien 3 valve  2. Coronary arteries sufficient height above annulus for deployment  3. Mild aortic root dilatation 3.8 cm  4. Optimum angiographic angle for deployment LAO 3 Caudal 16 degrees  Jenkins Rouge  Electronically Signed:  By: Jenkins Rouge M.D.  On: 08/27/2019 13:10   CLINICAL DATA: 85 year old male with history of aortic stenosis.  Preprocedural study prior to potential transcatheter aortic valve  replacement (TAVR) procedure.  EXAM:  CT ANGIOGRAPHY CHEST, ABDOMEN AND PELVIS  TECHNIQUE:  Multidetector CT imaging through the chest, abdomen and pelvis was  performed using the standard protocol during bolus administration of  intravenous contrast. Multiplanar reconstructed images and MIPs were  obtained and reviewed to evaluate the vascular anatomy.  CONTRAST: 172mL OMNIPAQUE IOHEXOL 350 MG/ML SOLN  COMPARISON: No priors.  FINDINGS:  CTA CHEST FINDINGS  Cardiovascular: Heart size is borderline enlarged. There is no  significant pericardial fluid, thickening or pericardial  calcification. There is aortic atherosclerosis, as well as  atherosclerosis of the great vessels of the mediastinum and the  coronary arteries, including  calcified atherosclerotic plaque in the  left anterior descending, left circumflex and right coronary  arteries. Severe thickening calcification of the aortic valve. Mild  calcifications of the mitral valve.  Mediastinum/Lymph Nodes: Multiple prominent borderline enlarged  mediastinal and bilateral hilar lymph nodes, measuring up to 1.2 cm  in short axis in the low right paratracheal nodal station,  nonspecific. Esophagus is unremarkable in appearance. No axillary  lymphadenopathy.  Lungs/Pleura: Areas of scarring or mild fibrosis in the lung bases  bilaterally. No acute consolidative airspace disease. No suspicious  appearing pulmonary nodules or masses are noted. Small bilateral  pleural  effusions lying dependently.  Musculoskeletal/Soft Tissues: There are no aggressive appearing  lytic or blastic lesions noted in the visualized portions of the  skeleton.  CTA ABDOMEN AND PELVIS FINDINGS  Hepatobiliary: Small calcified granuloma in segment 3 of the liver.  No suspicious cystic or solid hepatic lesions. No intra or  extrahepatic biliary ductal dilatation. Small calcified gallstones  lying dependently in the gallbladder measuring up to 6 mm. No  findings to suggest an acute cholecystitis at this time.  Pancreas: No pancreatic mass. No pancreatic ductal dilatation. No  pancreatic or peripancreatic fluid collections or inflammatory  changes.  Spleen: Unremarkable.  Adrenals/Urinary Tract: 2 mm nonobstructive calculus in the lower  pole collecting system of the right kidney. Bilateral kidneys and  adrenal glands are otherwise unremarkable in appearance. No  hydroureteronephrosis. Urinary bladder is normal in appearance.  Stomach/Bowel: Normal appearance of the stomach. No pathologic  dilatation of small bowel or colon. Numerous colonic diverticulae  are noted, particularly in the descending colon and sigmoid colon,  without surrounding inflammatory changes to suggest an acute   diverticulitis at this time. Normal appendix.  Vascular/Lymphatic: Aortic atherosclerosis, without evidence of  aneurysm or dissection in the abdominal or pelvic vasculature.  Vascular findings and measurements pertinent to potential TAVR  procedure, as detailed below. No lymphadenopathy noted in the  abdomen or pelvis.  Reproductive: Prostate gland and seminal vesicles are unremarkable  in appearance.  Other: No significant volume of ascites. No pneumoperitoneum.  Musculoskeletal: Status post PLIF from L4-S1. There are no  aggressive appearing lytic or blastic lesions noted in the  visualized portions of the skeleton.  VASCULAR MEASUREMENTS PERTINENT TO TAVR:  AORTA:  Minimal Aortic Diameter-14 x 18 mm  Severity of Aortic Calcification-severe  RIGHT PELVIS:  Right Common Iliac Artery -  Minimal Diameter-11.6 x 10.6 mm  Tortuosity-mild  Calcification-moderate  Right External Iliac Artery -  Minimal Diameter-8.1 x 6.3 mm  Tortuosity-severe  Calcification-none  Right Common Femoral Artery -  Minimal Diameter-8.6 x 8.4 mm  Tortuosity-mild  Calcification-mild-to-moderate  LEFT PELVIS:  Left Common Iliac Artery -  Minimal Diameter-11.4 x 12.1 mm  Tortuosity-mild  Calcification-moderate  Left External Iliac Artery -  Minimal Diameter-7.8 x 8.0 mm  Tortuosity-severe  Calcification-none  Left Common Femoral Artery -  Minimal Diameter-7.9 x 8.2 mm  Tortuosity-mild  Calcification-mild-to-moderate  Review of the MIP images confirms the above findings.  IMPRESSION:  1. Vascular findings and measurements pertinent to potential TAVR  procedure, as detailed above.  2. Severe thickening calcification of the aortic valve, compatible  with the reported clinical history of severe aortic stenosis.  3. Aortic atherosclerosis, in addition to 3 vessel coronary artery  disease.  4. Cholelithiasis without evidence of acute cholecystitis at this  time.  5. 2 mm nonobstructive calculus in  the lower pole collecting system  of the right kidney. No ureteral stones or findings of urinary tract  obstruction.  6. Extensive colonic diverticulosis without evidence of acute  diverticulitis at this time.  7. Additional incidental findings, as above.  Electronically Signed  By: Vinnie Langton M.D.  On: 08/27/2019 16:11    STS RISK CALCULATOR:   Isolated AVR  Risk of Mortality:  2.025%  Renal Failure:  3.222%  Permanent Stroke:  1.434%  Prolonged Ventilation:  6.995%  DSW Infection:  0.142%  Reoperation:  3.805%  Morbidity or Mortality:  11.553%  Short Length of Stay:  22.256%  Long Length of Stay:  7.421%    Impression:   This  85 year old gentleman has stage D, severe, symptomatic aortic stenosis with New York Heart Association class III symptoms of exertional fatigue and shortness of breath consistent with chronic diastolic congestive heart failure. I have personally reviewed his 2D echocardiogram, cardiac catheterization, and CTA studies. His echocardiogram shows moderate thickening aortic valve with severe calcification of the aortic valve with restricted mobility. The mean gradient across aortic valve was measured at 58 mmHg consistent with critical aortic stenosis. Left ventricular systolic function is normal. He also has moderate mitral regurgitation with a degenerative mitral valve. Cardiac catheterization showed no significant coronary disease. Right heart catheterization to show severe pulmonary hypertension with elevated wedge pressure. I agree that aortic valve replacement is indicated in this patient with progressive lifestyle limiting symptoms of severe aortic stenosis. 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 I think  transcatheter aortic valve placement would be a reasonable alternative. His gated cardiac CTA shows anatomy suitable for transcatheter aortic valve replacement. His abdominal pelvic CTA shows adequate pelvic vascular anatomy to allow transfemoral insertion.  The patient and daughter were counseled at length regarding treatment alternatives for management of severe symptomatic aortic stenosis. The risks and benefits of surgical intervention has been discussed in detail. Long-term prognosis with medical therapy was discussed. Alternative approaches such as conventional surgical aortic valve replacement, transcatheter aortic valve replacement, and palliative medical therapy were compared and contrasted at length. This discussion was placed in the context of the patient's own specific clinical presentation and past medical history. All of their questions have been addressed.  Following the decision to proceed with transcatheter aortic valve replacement, a discussion was held regarding what types of management strategies would be attempted intraoperatively in the event of life-threatening complications, including whether or not the patient would be considered a candidate for the use of cardiopulmonary bypass and/or conversion to open sternotomy for attempted surgical intervention. Given his advanced age and limited mobility I do not think he would be a candidate for emergent sternotomy to manage any intraoperative complications.  The patient has been advised of a variety of complications that might develop including but not limited to risks of death, stroke, paravalvular leak, aortic dissection or other major vascular complications, aortic annulus rupture, device embolization, cardiac rupture or perforation, mitral regurgitation, acute myocardial infarction, arrhythmia, heart block or bradycardia requiring permanent pacemaker placement, congestive heart failure, respiratory failure, renal failure, pneumonia,  infection, other late complications related to structural valve deterioration or migration, or other complications that might ultimately cause a temporary or permanent loss of functional independence or other long term morbidity. The patient provides full informed consent for the procedure as described and all questions were answered.   Plan:   Transfemoral transcatheter aortic valve replacement. He has already stopped Eliquis for his steroid spinal injection and I told him to remain off Eliquis until after surgery.

## 2019-09-22 ENCOUNTER — Inpatient Hospital Stay (HOSPITAL_COMMUNITY)
Admission: RE | Admit: 2019-09-22 | Discharge: 2019-09-23 | DRG: 266 | Disposition: A | Discharge status: Home / Self Care | Payer: Medicare Other | Attending: Surgery | Admitting: Surgery

## 2019-09-22 ENCOUNTER — Encounter (HOSPITAL_COMMUNITY): Payer: Self-pay | Admitting: Anesthesiology

## 2019-09-22 ENCOUNTER — Other Ambulatory Visit: Payer: Self-pay

## 2019-09-22 ENCOUNTER — Inpatient Hospital Stay (HOSPITAL_COMMUNITY): Payer: Medicare Other

## 2019-09-22 ENCOUNTER — Other Ambulatory Visit (HOSPITAL_COMMUNITY): Payer: Medicare Other

## 2019-09-22 ENCOUNTER — Encounter (HOSPITAL_COMMUNITY)
Admission: RE | Disposition: A | Discharge status: Home / Self Care | Payer: Self-pay | Source: Home / Self Care | Attending: Surgery

## 2019-09-22 ENCOUNTER — Inpatient Hospital Stay (HOSPITAL_COMMUNITY): Payer: Medicare Other | Admitting: Physician Assistant

## 2019-09-22 ENCOUNTER — Other Ambulatory Visit: Payer: Self-pay | Admitting: Physician Assistant

## 2019-09-22 DIAGNOSIS — Z79899 Other long term (current) drug therapy: Secondary | ICD-10-CM | POA: Diagnosis not present

## 2019-09-22 DIAGNOSIS — K219 Gastro-esophageal reflux disease without esophagitis: Secondary | ICD-10-CM | POA: Diagnosis present

## 2019-09-22 DIAGNOSIS — Z952 Presence of prosthetic heart valve: Secondary | ICD-10-CM

## 2019-09-22 DIAGNOSIS — I779 Disorder of arteries and arterioles, unspecified: Secondary | ICD-10-CM | POA: Diagnosis present

## 2019-09-22 DIAGNOSIS — Z87442 Personal history of urinary calculi: Secondary | ICD-10-CM | POA: Diagnosis not present

## 2019-09-22 DIAGNOSIS — I350 Nonrheumatic aortic (valve) stenosis: Secondary | ICD-10-CM

## 2019-09-22 DIAGNOSIS — I110 Hypertensive heart disease with heart failure: Secondary | ICD-10-CM | POA: Diagnosis present

## 2019-09-22 DIAGNOSIS — J45909 Unspecified asthma, uncomplicated: Secondary | ICD-10-CM | POA: Diagnosis present

## 2019-09-22 DIAGNOSIS — Z8249 Family history of ischemic heart disease and other diseases of the circulatory system: Secondary | ICD-10-CM

## 2019-09-22 DIAGNOSIS — I10 Essential (primary) hypertension: Secondary | ICD-10-CM | POA: Diagnosis present

## 2019-09-22 DIAGNOSIS — Z96653 Presence of artificial knee joint, bilateral: Secondary | ICD-10-CM | POA: Diagnosis present

## 2019-09-22 DIAGNOSIS — Z8572 Personal history of non-Hodgkin lymphomas: Secondary | ICD-10-CM

## 2019-09-22 DIAGNOSIS — M5417 Radiculopathy, lumbosacral region: Secondary | ICD-10-CM | POA: Diagnosis present

## 2019-09-22 DIAGNOSIS — Z9221 Personal history of antineoplastic chemotherapy: Secondary | ICD-10-CM

## 2019-09-22 DIAGNOSIS — Z006 Encounter for examination for normal comparison and control in clinical research program: Secondary | ICD-10-CM

## 2019-09-22 DIAGNOSIS — Z87891 Personal history of nicotine dependence: Secondary | ICD-10-CM | POA: Diagnosis not present

## 2019-09-22 DIAGNOSIS — I5033 Acute on chronic diastolic (congestive) heart failure: Secondary | ICD-10-CM | POA: Diagnosis not present

## 2019-09-22 DIAGNOSIS — I272 Pulmonary hypertension, unspecified: Secondary | ICD-10-CM | POA: Diagnosis present

## 2019-09-22 DIAGNOSIS — C8590 Non-Hodgkin lymphoma, unspecified, unspecified site: Secondary | ICD-10-CM | POA: Diagnosis present

## 2019-09-22 DIAGNOSIS — I4510 Unspecified right bundle-branch block: Secondary | ICD-10-CM | POA: Diagnosis not present

## 2019-09-22 DIAGNOSIS — Z880 Allergy status to penicillin: Secondary | ICD-10-CM | POA: Diagnosis not present

## 2019-09-22 DIAGNOSIS — I480 Paroxysmal atrial fibrillation: Secondary | ICD-10-CM | POA: Diagnosis present

## 2019-09-22 DIAGNOSIS — Z8673 Personal history of transient ischemic attack (TIA), and cerebral infarction without residual deficits: Secondary | ICD-10-CM | POA: Diagnosis not present

## 2019-09-22 DIAGNOSIS — Z7901 Long term (current) use of anticoagulants: Secondary | ICD-10-CM

## 2019-09-22 DIAGNOSIS — Z954 Presence of other heart-valve replacement: Secondary | ICD-10-CM | POA: Diagnosis not present

## 2019-09-22 HISTORY — DX: Non-Hodgkin lymphoma, unspecified, unspecified site: C85.90

## 2019-09-22 HISTORY — DX: Chronic diastolic (congestive) heart failure: I50.32

## 2019-09-22 HISTORY — DX: Presence of prosthetic heart valve: Z95.2

## 2019-09-22 HISTORY — DX: Paroxysmal atrial fibrillation: I48.0

## 2019-09-22 HISTORY — DX: Type 2 diabetes mellitus without complications: E11.9

## 2019-09-22 HISTORY — DX: Disorder of arteries and arterioles, unspecified: I77.9

## 2019-09-22 HISTORY — DX: Radiculopathy, lumbosacral region: M54.17

## 2019-09-22 HISTORY — PX: TRANSCATHETER AORTIC VALVE REPLACEMENT, TRANSFEMORAL: SHX6400

## 2019-09-22 HISTORY — DX: Nonrheumatic aortic (valve) stenosis: I35.0

## 2019-09-22 HISTORY — DX: Personal history of transient ischemic attack (TIA), and cerebral infarction without residual deficits: Z86.73

## 2019-09-22 HISTORY — PX: TEE WITHOUT CARDIOVERSION: SHX5443

## 2019-09-22 LAB — POCT I-STAT 7, (LYTES, BLD GAS, ICA,H+H)
Acid-base deficit: 5 mmol/L — ABNORMAL HIGH (ref 0.0–2.0)
Bicarbonate: 21.5 mmol/L (ref 20.0–28.0)
Calcium, Ion: 1.21 mmol/L (ref 1.15–1.40)
HCT: 36 % — ABNORMAL LOW (ref 39.0–52.0)
Hemoglobin: 12.2 g/dL — ABNORMAL LOW (ref 13.0–17.0)
O2 Saturation: 97 %
Potassium: 3.9 mmol/L (ref 3.5–5.1)
Sodium: 142 mmol/L (ref 135–145)
TCO2: 23 mmol/L (ref 22–32)
pCO2 arterial: 45.7 mmHg (ref 32.0–48.0)
pH, Arterial: 7.281 — ABNORMAL LOW (ref 7.350–7.450)
pO2, Arterial: 107 mmHg (ref 83.0–108.0)

## 2019-09-22 LAB — POCT I-STAT 4, (NA,K, GLUC, HGB,HCT)
Glucose, Bld: 218 mg/dL — ABNORMAL HIGH (ref 70–99)
Glucose, Bld: 230 mg/dL — ABNORMAL HIGH (ref 70–99)
Glucose, Bld: 209 mg/dL — ABNORMAL HIGH (ref 70–99)
HCT: 35 % — ABNORMAL LOW (ref 39.0–52.0)
HCT: 40 % (ref 39.0–52.0)
HCT: 36 % — ABNORMAL LOW (ref 39.0–52.0)
Hemoglobin: 11.9 g/dL — ABNORMAL LOW (ref 13.0–17.0)
Hemoglobin: 13.6 g/dL (ref 13.0–17.0)
Hemoglobin: 12.2 g/dL — ABNORMAL LOW (ref 13.0–17.0)
Potassium: 3.7 mmol/L (ref 3.5–5.1)
Potassium: 3.8 mmol/L (ref 3.5–5.1)
Potassium: 3.8 mmol/L (ref 3.5–5.1)
Sodium: 141 mmol/L (ref 135–145)
Sodium: 141 mmol/L (ref 135–145)
Sodium: 142 mmol/L (ref 135–145)

## 2019-09-22 LAB — POCT I-STAT, CHEM 8
BUN: 17 mg/dL (ref 8–23)
Calcium, Ion: 1.13 mmol/L — ABNORMAL LOW (ref 1.15–1.40)
Chloride: 109 mmol/L (ref 98–111)
Creatinine, Ser: 0.9 mg/dL (ref 0.61–1.24)
Glucose, Bld: 198 mg/dL — ABNORMAL HIGH (ref 70–99)
HCT: 34 % — ABNORMAL LOW (ref 39.0–52.0)
Hemoglobin: 11.6 g/dL — ABNORMAL LOW (ref 13.0–17.0)
Potassium: 3.8 mmol/L (ref 3.5–5.1)
Sodium: 142 mmol/L (ref 135–145)
TCO2: 21 mmol/L — ABNORMAL LOW (ref 22–32)

## 2019-09-22 LAB — GLUCOSE, CAPILLARY
Glucose-Capillary: 187 mg/dL — ABNORMAL HIGH (ref 70–99)
Glucose-Capillary: 202 mg/dL — ABNORMAL HIGH (ref 70–99)
Glucose-Capillary: 266 mg/dL — ABNORMAL HIGH (ref 70–99)

## 2019-09-22 LAB — POCT ACTIVATED CLOTTING TIME
Activated Clotting Time: 109 seconds
Activated Clotting Time: 252 seconds
Activated Clotting Time: 109 seconds

## 2019-09-22 IMAGING — DX DG CHEST 1V PORT
1 series · 1 of 1 positions shown · non-contrast
Comparison: [DATE]

CLINICAL DATA: Status post transcatheter aortic valve replacement

EXAM:
PORTABLE CHEST 1 VIEW

[chest ap]
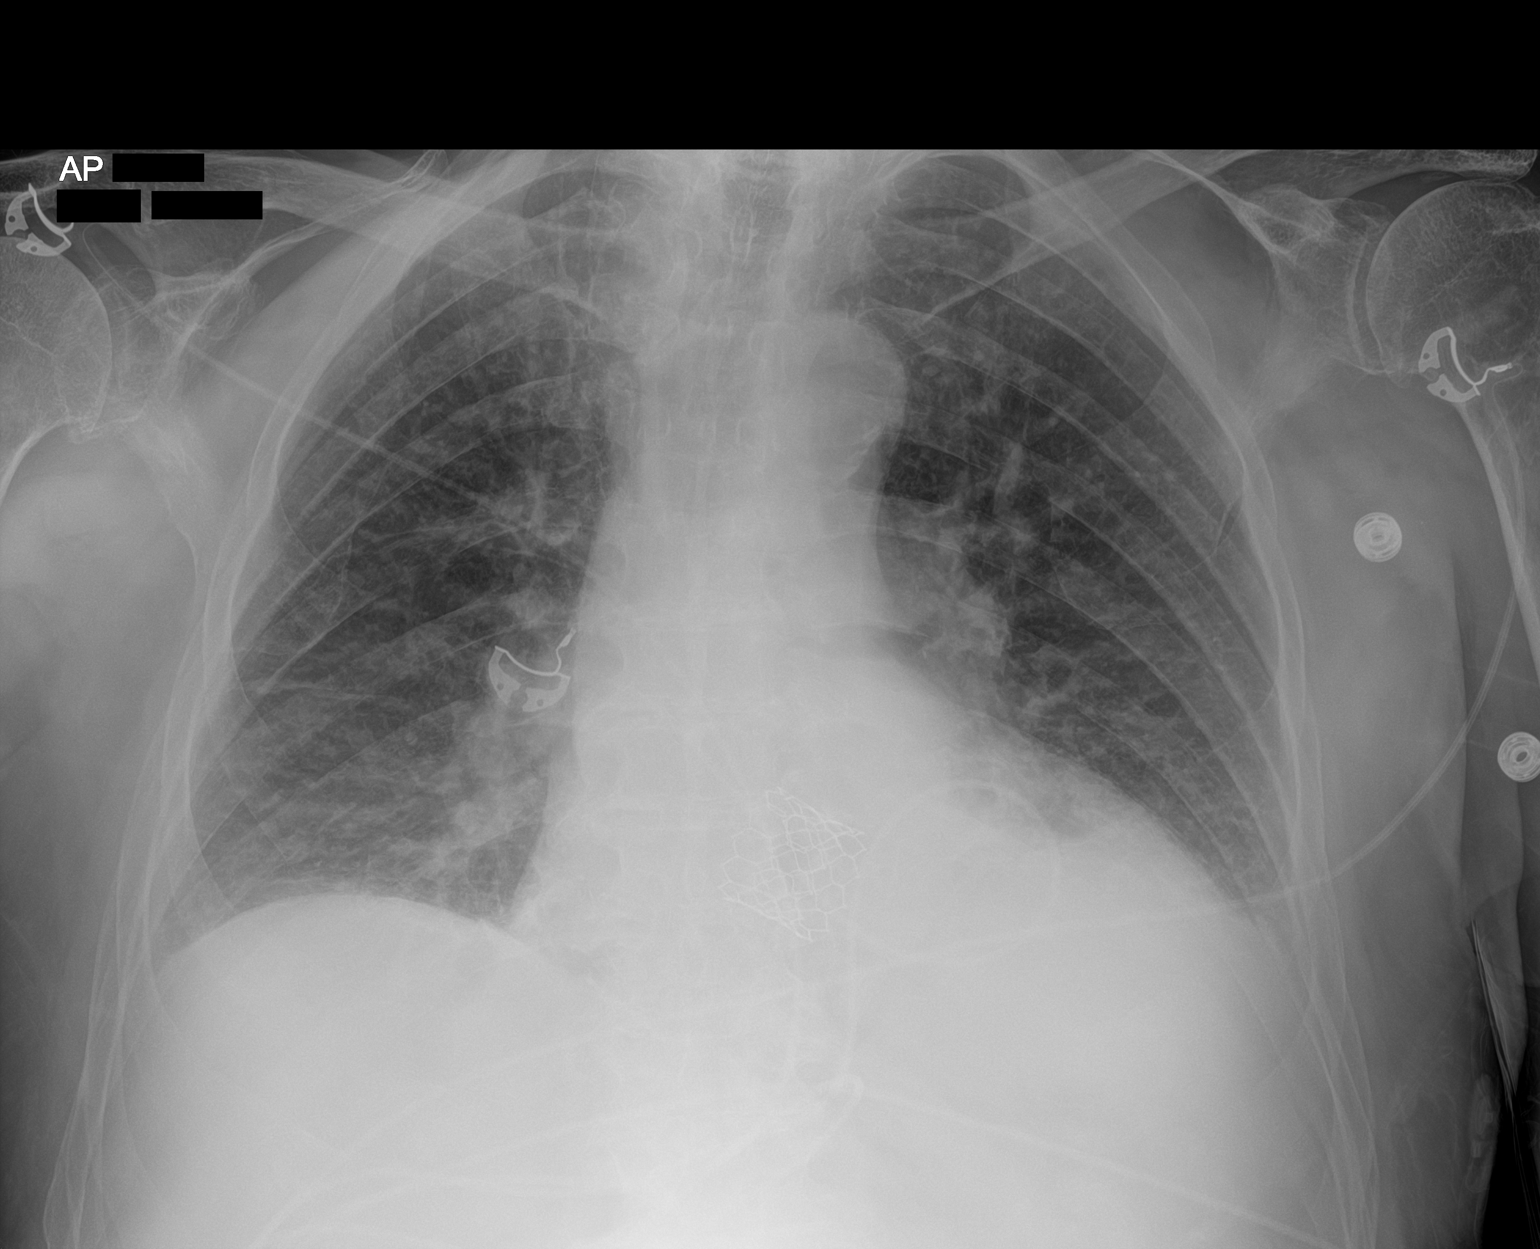

[1 of 1 positions shown; findings below may reference images not displayed]

FINDINGS: The heart size is enlarged. A trans catheter aortic valve
replacement is identified. The mediastinal contour is normal. Mild
patchy consolidation of left lung base is identified. There is no
pulmonary edema. Possible minimal left pleural effusion is noted.
Bony structures are stable.
IMPRESSION: Transcatheter aortic valvular replacement identified. No
pneumothorax.

Mild patchy consolidation of left lung base is noted with probable
minimal left pleural effusion. Findings may be in part due to
atelectasis.

## 2019-09-22 SURGERY — IMPLANTATION, AORTIC VALVE, TRANSCATHETER, FEMORAL APPROACH
Anesthesia: Monitor Anesthesia Care

## 2019-09-22 MED ORDER — CHLORHEXIDINE GLUCONATE 4 % EX LIQD: 60.0000 mL | Freq: Once | CUTANEOUS | Status: DC

## 2019-09-22 MED ORDER — OXYCODONE HCL 5 MG PO TABS: 5.0000 mg | ORAL_TABLET | ORAL | Status: DC | PRN

## 2019-09-22 MED ORDER — GUAIFENESIN ER 600 MG PO TB12: 600.0000 mg | ORAL_TABLET | Freq: Two times a day (BID) | ORAL | Status: DC | PRN

## 2019-09-22 MED ORDER — HEPARIN SODIUM (PORCINE) 1000 UNIT/ML IJ SOLN
INTRAMUSCULAR | Status: DC | PRN
  Administered 2019-09-22: 13000 [IU] via INTRAVENOUS

## 2019-09-22 MED ORDER — CHLORHEXIDINE GLUCONATE 0.12 % MT SOLN
15.0000 mL | Freq: Once | OROMUCOSAL | Status: AC
  Administered 2019-09-22: 08:00:00 15 mL via OROMUCOSAL
  Filled 2019-09-22: qty 15

## 2019-09-22 MED ORDER — LACTULOSE 10 GM/15ML PO SOLN: 20.0000 g | Freq: Every day | ORAL | Status: DC | PRN

## 2019-09-22 MED ORDER — LIDOCAINE HCL (PF) 1 % IJ SOLN
INTRAMUSCULAR | Status: DC | PRN
  Administered 2019-09-22 (×2): 10 mL

## 2019-09-22 MED ORDER — PANTOPRAZOLE SODIUM 40 MG PO TBEC
40.0000 mg | DELAYED_RELEASE_TABLET | Freq: Every day | ORAL | Status: DC
  Administered 2019-09-22 – 2019-09-23 (×2): 40 mg via ORAL
  Filled 2019-09-22 (×2): qty 1

## 2019-09-22 MED ORDER — TRAMADOL HCL 50 MG PO TABS: 50.0000 mg | ORAL_TABLET | ORAL | Status: DC | PRN

## 2019-09-22 MED ORDER — LORATADINE 10 MG PO TABS
10.0000 mg | ORAL_TABLET | Freq: Every day | ORAL | Status: DC
  Administered 2019-09-22 – 2019-09-23 (×2): 10 mg via ORAL
  Filled 2019-09-22 (×2): qty 1

## 2019-09-22 MED ORDER — ACETAMINOPHEN 650 MG RE SUPP: 650.0000 mg | Freq: Four times a day (QID) | RECTAL | Status: DC | PRN

## 2019-09-22 MED ORDER — ONDANSETRON HCL 4 MG/2ML IJ SOLN: 4.0000 mg | Freq: Four times a day (QID) | INTRAMUSCULAR | Status: DC | PRN

## 2019-09-22 MED ORDER — SODIUM CHLORIDE 0.9 % IV SOLN: 250.0000 mL | INTRAVENOUS | Status: DC | PRN

## 2019-09-22 MED ORDER — AZELASTINE HCL 0.1 % NA SOLN: 1.0000 | Freq: Two times a day (BID) | NASAL | Status: DC | PRN

## 2019-09-22 MED ORDER — CHLORHEXIDINE GLUCONATE 0.12 % MT SOLN
OROMUCOSAL | Status: AC
  Administered 2019-09-22: 15 mL via OROMUCOSAL
  Filled 2019-09-22: qty 15

## 2019-09-22 MED ORDER — TAMSULOSIN HCL 0.4 MG PO CAPS
0.4000 mg | ORAL_CAPSULE | Freq: Every day | ORAL | Status: DC
  Administered 2019-09-22: 17:00:00 0.4 mg via ORAL
  Filled 2019-09-22: qty 1

## 2019-09-22 MED ORDER — PHENYLEPHRINE 40 MCG/ML (10ML) SYRINGE FOR IV PUSH (FOR BLOOD PRESSURE SUPPORT)
PREFILLED_SYRINGE | INTRAVENOUS | Status: DC | PRN
  Administered 2019-09-22: 40 ug via INTRAVENOUS
  Administered 2019-09-22 (×2): 80 ug via INTRAVENOUS
  Administered 2019-09-22 (×4): 40 ug via INTRAVENOUS

## 2019-09-22 MED ORDER — ATORVASTATIN CALCIUM 40 MG PO TABS
40.0000 mg | ORAL_TABLET | Freq: Every day | ORAL | Status: DC
  Administered 2019-09-22: 40 mg via ORAL
  Filled 2019-09-22: qty 1

## 2019-09-22 MED ORDER — SODIUM CHLORIDE 0.9 % IV SOLN: INTRAVENOUS | Status: AC

## 2019-09-22 MED ORDER — FAMOTIDINE 20 MG PO TABS: 10.0000 mg | ORAL_TABLET | Freq: Every day | ORAL | Status: DC | PRN

## 2019-09-22 MED ORDER — CLOPIDOGREL BISULFATE 75 MG PO TABS: 75.0000 mg | ORAL_TABLET | Freq: Every day | ORAL | Status: DC

## 2019-09-22 MED ORDER — PROTAMINE SULFATE 10 MG/ML IV SOLN
INTRAVENOUS | Status: DC | PRN
  Administered 2019-09-22: 25 mg via INTRAVENOUS
  Administered 2019-09-22 (×2): 30 mg via INTRAVENOUS
  Administered 2019-09-22: 10 mg via INTRAVENOUS
  Administered 2019-09-22: 35 mg via INTRAVENOUS

## 2019-09-22 MED ORDER — LEVOFLOXACIN IN D5W 750 MG/150ML IV SOLN
750.0000 mg | INTRAVENOUS | Status: AC
  Administered 2019-09-23: 750 mg via INTRAVENOUS
  Filled 2019-09-22: qty 150

## 2019-09-22 MED ORDER — LIDOCAINE HCL 1 % IJ SOLN
INTRAMUSCULAR | Status: AC
  Filled 2019-09-22: qty 20

## 2019-09-22 MED ORDER — ONDANSETRON HCL 4 MG/2ML IJ SOLN
INTRAMUSCULAR | Status: DC | PRN
  Administered 2019-09-22: 4 mg via INTRAVENOUS

## 2019-09-22 MED ORDER — ASPIRIN 81 MG PO CHEW
81.0000 mg | CHEWABLE_TABLET | Freq: Every day | ORAL | Status: DC
  Administered 2019-09-23: 11:00:00 81 mg via ORAL
  Filled 2019-09-22: qty 1

## 2019-09-22 MED ORDER — SODIUM CHLORIDE 0.9% FLUSH: 3.0000 mL | INTRAVENOUS | Status: DC | PRN

## 2019-09-22 MED ORDER — HEPARIN (PORCINE) IN NACL 1000-0.9 UT/500ML-% IV SOLN
INTRAVENOUS | Status: DC | PRN
  Administered 2019-09-22 (×3): 500 mL

## 2019-09-22 MED ORDER — PROPOFOL 500 MG/50ML IV EMUL
INTRAVENOUS | Status: DC | PRN
  Administered 2019-09-22: 25 ug/kg/min via INTRAVENOUS

## 2019-09-22 MED ORDER — HEPARIN (PORCINE) IN NACL 1000-0.9 UT/500ML-% IV SOLN
INTRAVENOUS | Status: AC
  Filled 2019-09-22: qty 1500

## 2019-09-22 MED ORDER — CHLORHEXIDINE GLUCONATE 4 % EX LIQD: 30.0000 mL | CUTANEOUS | Status: DC

## 2019-09-22 MED ORDER — INSULIN ASPART 100 UNIT/ML ~~LOC~~ SOLN
0.0000 [IU] | Freq: Three times a day (TID) | SUBCUTANEOUS | Status: DC
  Administered 2019-09-22: 8 [IU] via SUBCUTANEOUS
  Administered 2019-09-22: 12 [IU] via SUBCUTANEOUS
  Administered 2019-09-23: 4 [IU] via SUBCUTANEOUS
  Administered 2019-09-23: 12:00:00 8 [IU] via SUBCUTANEOUS

## 2019-09-22 MED ORDER — SODIUM CHLORIDE 0.9% FLUSH
3.0000 mL | Freq: Two times a day (BID) | INTRAVENOUS | Status: DC
  Administered 2019-09-22 – 2019-09-23 (×2): 3 mL via INTRAVENOUS

## 2019-09-22 MED ORDER — SODIUM CHLORIDE 0.9 % IV SOLN
INTRAVENOUS | Status: DC
  Administered 2019-09-22: 10:00:00 via INTRAVENOUS

## 2019-09-22 MED ORDER — PHENYLEPHRINE HCL-NACL 20-0.9 MG/250ML-% IV SOLN
0.0000 ug/min | INTRAVENOUS | Status: DC
  Filled 2019-09-22: qty 250

## 2019-09-22 MED ORDER — MIRTAZAPINE 15 MG PO TABS
7.5000 mg | ORAL_TABLET | Freq: Every day | ORAL | Status: DC
  Administered 2019-09-22: 7.5 mg via ORAL
  Filled 2019-09-22: qty 1

## 2019-09-22 MED ORDER — EPHEDRINE SULFATE-NACL 50-0.9 MG/10ML-% IV SOSY
PREFILLED_SYRINGE | INTRAVENOUS | Status: DC | PRN
  Administered 2019-09-22: 10 mg via INTRAVENOUS

## 2019-09-22 MED ORDER — VANCOMYCIN HCL IN DEXTROSE 1-5 GM/200ML-% IV SOLN
1000.0000 mg | Freq: Once | INTRAVENOUS | Status: AC
  Administered 2019-09-22: 22:00:00 1000 mg via INTRAVENOUS
  Filled 2019-09-22 (×2): qty 200

## 2019-09-22 MED ORDER — MORPHINE SULFATE (PF) 2 MG/ML IV SOLN: 1.0000 mg | INTRAVENOUS | Status: DC | PRN

## 2019-09-22 MED ORDER — LACTATED RINGERS IV SOLN: INTRAVENOUS | Status: DC

## 2019-09-22 MED ORDER — GABAPENTIN 300 MG PO CAPS
300.0000 mg | ORAL_CAPSULE | Freq: Three times a day (TID) | ORAL | Status: DC
  Administered 2019-09-22 – 2019-09-23 (×3): 300 mg via ORAL
  Filled 2019-09-22 (×3): qty 1

## 2019-09-22 MED ORDER — SODIUM CHLORIDE 0.9 % IV SOLN
INTRAVENOUS | Status: DC | PRN
  Administered 2019-09-22: 10:00:00 via INTRAVENOUS

## 2019-09-22 MED ORDER — IOHEXOL 350 MG/ML SOLN
INTRAVENOUS | Status: DC | PRN
  Administered 2019-09-22: 11:00:00 45 mL

## 2019-09-22 MED ORDER — ACETAMINOPHEN 325 MG PO TABS: 650.0000 mg | ORAL_TABLET | Freq: Four times a day (QID) | ORAL | Status: DC | PRN

## 2019-09-22 MED ORDER — DEXAMETHASONE SODIUM PHOSPHATE 10 MG/ML IJ SOLN
INTRAMUSCULAR | Status: DC | PRN
  Administered 2019-09-22: 4 mg via INTRAVENOUS

## 2019-09-22 MED ORDER — NITROGLYCERIN IN D5W 200-5 MCG/ML-% IV SOLN: 0.0000 ug/min | INTRAVENOUS | Status: DC

## 2019-09-22 SURGICAL SUPPLY — 36 items
BAG SNAP BAND KOVER 36X36 (MISCELLANEOUS) ×8 IMPLANT
BLANKET WARM UNDERBOD FULL ACC (MISCELLANEOUS) ×3 IMPLANT
CABLE ADAPT PACING TEMP 12FT (ADAPTER) ×2 IMPLANT
CATH 29 EDWARDS DELIVERY SYS (CATHETERS) ×2 IMPLANT
CATH DIAG 6FR PIGTAIL ANGLED (CATHETERS) ×4 IMPLANT
CATH INFINITI 6F AL2 (CATHETERS) ×2 IMPLANT
CATH S G BIP PACING (CATHETERS) ×2 IMPLANT
CLOSURE MYNX CONTROL 6F/7F (Vascular Products) ×2 IMPLANT
CRIMPER (MISCELLANEOUS) ×2 IMPLANT
DEVICE CLOSURE PERCLS PRGLD 6F (VASCULAR PRODUCTS) IMPLANT
DEVICE INFLATION ATRION QL38 (MISCELLANEOUS) ×2 IMPLANT
ELECT DEFIB PAD ADLT CADENCE (PAD) ×2 IMPLANT
GUIDEWIRE SAFE TJ AMPLATZ EXST (WIRE) ×2 IMPLANT
KIT HEART LEFT (KITS) ×3 IMPLANT
KIT MICROPUNCTURE NIT STIFF (SHEATH) ×2 IMPLANT
PACK CARDIAC CATHETERIZATION (CUSTOM PROCEDURE TRAY) ×3 IMPLANT
PERCLOSE PROGLIDE 6F (VASCULAR PRODUCTS) ×6
SHEATH 16X36 EDWARDS (SHEATH) ×2 IMPLANT
SHEATH BRITE TIP 7FR 35CM (SHEATH) ×2 IMPLANT
SHEATH PINNACLE 6F 10CM (SHEATH) ×2 IMPLANT
SHEATH PINNACLE 8F 10CM (SHEATH) ×2 IMPLANT
SHEATH PROBE COVER 6X72 (BAG) ×2 IMPLANT
SHIELD RADPAD SCOOP 12X17 (MISCELLANEOUS) ×2 IMPLANT
SLEEVE REPOSITIONING LENGTH 30 (MISCELLANEOUS) ×2 IMPLANT
STOPCOCK MORSE 400PSI 3WAY (MISCELLANEOUS) ×6 IMPLANT
SYR MEDRAD MARK V 150ML (SYRINGE) ×2 IMPLANT
TRANSDUCER W/STOPCOCK (MISCELLANEOUS) ×6 IMPLANT
TUBE CONN 8.8X1320 FR HP M-F (CONNECTOR) ×2 IMPLANT
TUBING ART PRESS 72  MALE/FEM (TUBING) ×2
TUBING ART PRESS 72 MALE/FEM (TUBING) IMPLANT
TUBING CIL FLEX 10 FLL-RA (TUBING) ×2 IMPLANT
VALVE HEART TRANSCATH SZ3 29MM (Valve) ×2 IMPLANT
WIRE AMPLATZ SS-J .035X180CM (WIRE) ×2 IMPLANT
WIRE EMERALD 3MM-J .035X150CM (WIRE) ×2 IMPLANT
WIRE EMERALD 3MM-J .035X260CM (WIRE) ×2 IMPLANT
WIRE EMERALD ST .035X260CM (WIRE) ×2 IMPLANT

## 2019-09-22 NOTE — Progress Notes (Signed)
Patient arrived from the cath lab to 4E room 1 after undergoing TAVR w/Dr. Burt Knack.  Telemetry monitor applied and CCMD notified.  CHG bath done.  Bilateral groin sites noted to be level 0 on arrival.  Vitals being cycled.  Will continue to monitor.

## 2019-09-22 NOTE — Progress Notes (Signed)
Inpatient Diabetes Program Recommendations  AACE/ADA: New Consensus Statement on Inpatient Glycemic Control  Target Ranges:  Prepandial:   less than 140 mg/dL      Peak postprandial:   less than 180 mg/dL (1-2 hours)      Critically ill patients:  140 - 180 mg/dL   Results for Jacob Simpson, Jacob Simpson (MRN [id number]) as of 09/22/2019 14:37  Ref. Range 09/22/2019 10:37 09/22/2019 11:07 09/22/2019 11:33 09/22/2019 12:11  Glucose Latest Ref Range: 70 - 99 mg/dL 230 (H) 218 (H) 209 (H) 198 (H)  Results for Jacob Simpson, Jacob Simpson (MRN [id number]) as of 09/22/2019 14:37  Ref. Range 02/25/2019 01:16 09/18/2019 12:30  Hemoglobin A1C Latest Ref Range: 4.8 - 5.6 % 6.7 (H) 7.7 (H)   Review of Glycemic Control  Diabetes history: DM2 Outpatient Diabetes medications: None; was on Metformin in the past Current orders for Inpatient glycemic control: Novolog 0-24 units ACHS  Inpatient Diabetes Program Recommendations:    A1C: A1C 7.7% on 09/18/19 indicating an average glucose of 174 mg/dl over the past 2-3 months. Patient's daughter Jacob Simpson states that patient can resume Metformin if prescribed at discharge (but asked that the extended release version NOT be prescribed). Will need a prescription for glucose monitoring kit as well if MD wants patient to check glucose at home.  NOTE: Noted consult for Diabetes Coordinator. Diabetes Coordinator working remotely. Called patient's room and his daughter Jacob Simpson answered the phone and reports that her father is hard of hearing and does not have his hearing aids in and he is sleeping as he just got back from a procedure. Jacob Simpson reports that patient was taking Metformin but his PCP took him off of the Metformin 8-12 months ago because his glucose and A1C was very good at the time.  Jacob Simpson is not sure of the dose of the Metformin that he was taking.  Patient does not have a glucometer at home to monitor glucose. Jacob Simpson reports that patient's glucose has been running a little higher than normal  on last couple of labs when checked by PCP.  Jacob Simpson reports that patient has a poor appetite and that he drinks Ensure and eats fruits but rarely is he able to eat a good meal. Explained that MD may want patient to resume Metformin at discharge and Jacob Simpson reports that would be fine if patient needed to restart taking it. Jacob Simpson plans to go by patient's house to look at the dose of the Metformin and to see if it is still in date.  Discussed A1C of 7.7% and explained that given patient's age, his A1C goal may be higher than normal A1C goal of 7% or less. Jacob Simpson states that if patient is prescribed Metformin at discharge, she asked that the extended release not be used due to recent recalls. Encouraged Jacob Simpson to have patient follow up with PCP regarding DM control. Jacob Simpson verbalized understanding and states that she has no questions or concerns at this time.  If MD wants patient to check glucose at home, he will need a prescription for glucose monitoring kit (#[id number]).     Thanks, Barnie Alderman, RN, MSN, CDE Diabetes Coordinator Inpatient Diabetes Program [phone] (Team Pager from 8am to 5pm)

## 2019-09-22 NOTE — Interval H&P Note (Signed)
History and Physical Interval Note:  09/22/2019 1:34 PM  Jacob Simpson  has presented today for surgery, with the diagnosis of Severe Aortic Stenosis.  The various methods of treatment have been discussed with the patient and family. After consideration of risks, benefits and other options for treatment, the patient has consented to  Procedure(s): TRANSCATHETER AORTIC VALVE REPLACEMENT, TRANSFEMORAL (N/A) TRANSESOPHAGEAL ECHOCARDIOGRAM (TEE) (N/A) as a surgical intervention.  The patient's history has been reviewed, patient examined, no change in status, stable for surgery.  I have reviewed the patient's chart and labs.  Questions were answered to the patient's satisfaction.     Gaye Pollack

## 2019-09-22 NOTE — Anesthesia Postprocedure Evaluation (Signed)
Anesthesia Post Note  Patient: Jacob Simpson  Procedure(s) Performed: TRANSCATHETER AORTIC VALVE REPLACEMENT, TRANSFEMORAL (N/A ) TRANSESOPHAGEAL ECHOCARDIOGRAM (TEE) (N/A )     Patient location during evaluation: PACU Anesthesia Type: MAC Level of consciousness: awake and alert Pain management: pain level controlled Vital Signs Assessment: post-procedure vital signs reviewed and stable Respiratory status: spontaneous breathing, nonlabored ventilation, respiratory function stable and patient connected to nasal cannula oxygen Cardiovascular status: stable and blood pressure returned to baseline Postop Assessment: no apparent nausea or vomiting Anesthetic complications: no    Last Vitals:  Vitals:   09/22/19 1320 09/22/19 1400  BP: (!) 144/77 (!) 155/85  Pulse: 74 72  Resp: 18 18  Temp: (!) 35.7 C   SpO2: 95% 97%    Last Pain:  Vitals:   09/22/19 1320  TempSrc: Axillary  PainSc: 0-No pain                 Effie Berkshire

## 2019-09-22 NOTE — Progress Notes (Signed)
  [facility] VALVE TEAM  Patient doing well s/p TAVR. He/She is hemodynamically stable. Groin sites stable. ECG with new RBBB but no high grade block. Arterial line discontinued and transferred  to 4E. Plan for early ambulation after bedrest completed and hopeful discharge over the next 24-48 hours. Will order a diabetes coordinator consult given elevated Hg A1c on pre admission labs.  Angelena Form PA-C  [facility]  Pager [phone]

## 2019-09-22 NOTE — Progress Notes (Signed)
  Echocardiogram 2D Echocardiogram limited has been performed.  Darlina Sicilian M 09/22/2019, 11:19 AM

## 2019-09-22 NOTE — Progress Notes (Signed)
SCD's ordered for pt from [facility].

## 2019-09-22 NOTE — Progress Notes (Signed)
Pt unable to void on arrival to unit, however, expressing feeling his bladder is full.  Bedrest until 3:30 pm.  Bladder scan demonstrating 450 ml.  In/out cath produced 1000 ml clear, yellow urine.  Will continue to monitor.

## 2019-09-22 NOTE — Progress Notes (Signed)
Pt walked in hallway with front wheeled walker approximately 400 ft without difficulty.  Will continue to monitor.

## 2019-09-22 NOTE — Plan of Care (Signed)
Poc progressing.  

## 2019-09-22 NOTE — Anesthesia Procedure Notes (Signed)
Procedure Name: MAC Date/Time: 09/22/2019 10:48 AM Performed by: Teressa Lower., CRNA Pre-anesthesia Checklist: Patient identified, Emergency Drugs available, Suction available, Patient being monitored and Timeout performed Patient Re-evaluated:Patient Re-evaluated prior to induction Oxygen Delivery Method: Simple face mask Ventilation: Oral airway inserted - appropriate to patient size

## 2019-09-22 NOTE — Anesthesia Procedure Notes (Signed)
Arterial Line Insertion Start/End10/05/2019 8:20 AM, 09/22/2019 8:39 AM Performed by: Josephine Igo, CRNA, CRNA  Patient location: Pre-op. Preanesthetic checklist: patient identified, IV checked, surgical consent, monitors and equipment checked and pre-op evaluation Lidocaine 1% used for infiltration Right, radial was placed Catheter size: 20 G Hand hygiene performed , maximum sterile barriers used  and Seldinger technique used  Attempts: 1 Procedure performed without using ultrasound guided technique. Following insertion, dressing applied and Biopatch. Post procedure assessment: normal  Patient tolerated the procedure well with no immediate complications.

## 2019-09-22 NOTE — Progress Notes (Signed)
  [facility]   MULTIDISCIPLINARY HEART VALVE TEAM  Patient doing well s/p TAVR. He is hemodynamically stable. Groin sites stable. ECG with no high grade block. Arterial line discontinued and transferred  to 4E. Plan for early ambulation after bedrest completed and hopeful discharge over the next 24-48 hours.   Seneca Gadbois PA-C  [facility]  Pager [phone]  

## 2019-09-22 NOTE — Op Note (Signed)
[facility]   MULTIDISCIPLINARY HEART VALVE TEAM   TAVR OPERATIVE NOTE   Date of Procedure:  09/22/2019  Preoperative Diagnosis: Severe Aortic Stenosis   Postoperative Diagnosis: Same   Procedure:    Transcatheter Aortic Valve Replacement - Percutaneous Transfemoral Approach  Edwards Sapien 3 THV (size 29 mm, model # 9600TFX, serial # [id number])   Co-Surgeons:  Gaye Pollack, MD and Sherren Mocha, MD  Anesthesiologist:  Suella Broad, MD  Echocardiographer:  Sanda Klein, MD  Pre-operative Echo Findings:  Severe aortic stenosis  Normal left ventricular systolic function  Post-operative Echo Findings:  No paravalvular leak  Normal left ventricular systolic function  BRIEF CLINICAL NOTE AND INDICATIONS FOR SURGERY  85 yo male with HTN, B-cell lymphoma, paroxysmal atrial fibrillation, congestive heart failure, and progressive Stage D1 critical aortic stenosis presents today for TAVR. Pre-op multidisciplinary evaluation includes an echo showing normal LV function with mean gradient 58 mmHg, CTA studies showing suitable anatomy for transfemoral TAVR, and cardiac catheterization demonstrating patent coronary arteries with no evidence of obstructive CAD.  During the course of the patient's preoperative work up they have been evaluated comprehensively by a multidisciplinary team of specialists coordinated through the [facility] in the [facility].  They have been demonstrated to suffer from symptomatic severe aortic stenosis as noted above. The patient has been counseled extensively as to the relative risks and benefits of all options for the treatment of severe aortic stenosis including long term medical therapy, conventional surgery for aortic valve replacement, and transcatheter aortic valve replacement.  The patient has been independently evaluated in formal cardiac surgical consultation by Dr Cyndia Bent, who deemed  the patient appropriate for TAVR. Based upon review of all of the patient's preoperative diagnostic tests they are felt to be candidate for transcatheter aortic valve replacement using the transfemoral approach as an alternative to conventional surgery.    Following the decision to proceed with transcatheter aortic valve replacement, a discussion has been held regarding what types of management strategies would be attempted intraoperatively in the event of life-threatening complications, including whether or not the patient would be considered a candidate for the use of cardiopulmonary bypass and/or conversion to open sternotomy for attempted surgical intervention.  The patient has been advised of a variety of complications that might develop peculiar to this approach including but not limited to risks of death, stroke, paravalvular leak, aortic dissection or other major vascular complications, aortic annulus rupture, device embolization, cardiac rupture or perforation, acute myocardial infarction, arrhythmia, heart block or bradycardia requiring permanent pacemaker placement, congestive heart failure, respiratory failure, renal failure, pneumonia, infection, other late complications related to structural valve deterioration or migration, or other complications that might ultimately cause a temporary or permanent loss of functional independence or other long term morbidity.  The patient provides full informed consent for the procedure as described and all questions were answered preoperatively.  DETAILS OF THE OPERATIVE PROCEDURE  PREPARATION:   The patient is brought to the operating room on the above mentioned date and central monitoring was established by the anesthesia team including placement of a central venous catheter and radial arterial line. The patient is placed in the supine position on the operating table.  Intravenous antibiotics are administered. The patient is monitored closely throughout the  procedure under conscious sedation.  Baseline transthoracic echocardiogram is performed. The patient's chest, abdomen, both groins, and both lower extremities are prepared and draped in a sterile manner. A time out  procedure is performed.   PERIPHERAL ACCESS:   Using ultrasound guidance, femoral arterial and venous access is obtained with placement of 6 Fr sheaths on the right side.  A pigtail diagnostic catheter was passed through the femoral arterial sheath under fluoroscopic guidance into the aortic root.  A temporary transvenous pacemaker catheter was passed through the femoral venous sheath under fluoroscopic guidance into the right ventricle.  The pacemaker was tested to ensure stable lead placement and pacemaker capture. Aortic root angiography was performed in order to determine the optimal angiographic angle for valve deployment.  TRANSFEMORAL ACCESS:  A micropuncture technique is used to access the left femoral artery under fluoroscopic and ultrasound guidance.  2 Perclose devices are deployed at 10' and 2' positions to 'PreClose' the femoral artery. An 8 French sheath is placed and then an Amplatz Superstiff wire is advanced through the sheath. This is changed out for a 16 French transfemoral E-Sheath after progressively dilating over the Superstiff wire.  An AL-2 catheter was used to direct a straight-tip exchange length wire across the native aortic valve into the left ventricle. This was exchanged out for a pigtail catheter and position was confirmed in the LV apex. Simultaneous LV and Ao pressures were recorded.  The pigtail catheter was exchanged for an Amplatz Extra-stiff wire in the LV apex.    BALLOON AORTIC VALVULOPLASTY:  Not performed  TRANSCATHETER HEART VALVE DEPLOYMENT:  An Edwards Sapien 3 transcatheter heart valve (size 29 mm) was prepared and crimped per manufacturer's guidelines, and the proper orientation of the valve is confirmed on the [employer] delivery system.  The valve was advanced through the introducer sheath using normal technique until in an appropriate position in the abdominal aorta beyond the sheath tip. The balloon was then retracted and using the fine-tuning wheel was centered on the valve. The valve was then advanced across the aortic arch using appropriate flexion of the catheter. The valve was carefully positioned across the aortic valve annulus. The Commander catheter was retracted using normal technique. Once final position of the valve has been confirmed by angiographic assessment, the valve is deployed while temporarily holding ventilation and during rapid ventricular pacing to maintain systolic blood pressure < 50 mmHg and pulse pressure < 10 mmHg. The balloon inflation is held for >3 seconds after reaching full deployment volume. Once the balloon has fully deflated the balloon is retracted into the ascending aorta and valve function is assessed using echocardiography. The patient's hemodynamic recovery following valve deployment is good.  The deployment balloon and guidewire are both removed. Echo demostrated acceptable post-procedural gradients, stable mitral valve function, and no aortic insufficiency.   PROCEDURE COMPLETION:  The sheath was removed and femoral artery closure is performed using the 2 previously deployed Perclose devices.  Protamine is administered once femoral arterial repair was complete. The site is clear with no evidence of bleeding or hematoma after the sutures are tightened. The temporary pacemaker and pigtail catheters are removed. Mynx closure is used for right femoral artery hemostasis.  The patient tolerated the procedure well and is transported to the surgical intensive care in stable condition. There were no immediate intraoperative complications. All sponge instrument and needle counts are verified correct at completion of the operation.   The patient received a total of 45 mL of intravenous contrast during the  procedure.   Sherren Mocha, MD 09/22/2019 8:15 AM

## 2019-09-22 NOTE — Progress Notes (Signed)
Rt radial arterial line removed, manual pressure held x 10 minutes. Gauze and tegaderm dressing applied. Site level 0. Palpable rt radial pulse.

## 2019-09-22 NOTE — Op Note (Signed)
[facility]   MULTIDISCIPLINARY HEART VALVE TEAM   TAVR OPERATIVE NOTE   Date of Procedure:  09/22/2019  Preoperative Diagnosis: Severe Aortic Stenosis   Postoperative Diagnosis: Same   Procedure:    Transcatheter Aortic Valve Replacement - Percutaneous Left Transfemoral Approach  Edwards Sapien 3 THV (size 29 mm, model # 9600TFX, serial # [id number])   Co-Surgeons:  Gaye Pollack, MD and Sherren Mocha, MD   Anesthesiologist:  Wilfrid Lund, MD  Echocardiographer:  Jerilynn Mages. Croitoru, MD  Pre-operative Echo Findings:  Severe aortic stenosis  Normal left ventricular systolic function  Post-operative Echo Findings:  No paravalvular leak  Normal left ventricular systolic function   BRIEF CLINICAL NOTE AND INDICATIONS FOR SURGERY  This 85 year old gentleman has stage D, severe, symptomatic aortic stenosis with New York Heart Association class III symptoms of exertional fatigue and shortness of breath consistent with chronic diastolic congestive heart failure. I have personally reviewed his 2D echocardiogram, cardiac catheterization, and CTA studies. His echocardiogram shows moderate thickening aortic valve with severe calcification of the aortic valve with restricted mobility. The mean gradient across aortic valve was measured at 58 mmHg consistent with critical aortic stenosis. Left ventricular systolic function is normal. He also has moderate mitral regurgitation with a degenerative mitral valve. Cardiac catheterization showed no significant coronary disease. Right heart catheterization to show severe pulmonary hypertension with elevated wedge pressure. I agree that aortic valve replacement is indicated in this patient with progressive lifestyle limiting symptoms of severe aortic stenosis. 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 I think transcatheter aortic valve placement would be a reasonable alternative. His gated cardiac CTA shows anatomy suitable for transcatheter aortic valve replacement. His abdominal pelvic CTA shows adequate pelvic vascular anatomy to allow transfemoral insertion.  The patient and daughter were counseled at length regarding treatment alternatives for management of severe symptomatic aortic stenosis. The risks and benefits of surgical intervention has been discussed in detail. Long-term prognosis with medical therapy was discussed. Alternative approaches such as conventional surgical aortic valve replacement, transcatheter aortic valve replacement, and palliative medical therapy were compared and contrasted at length. This discussion was placed in the context of the patient's own specific clinical presentation and past medical history. All of their questions have been addressed.  Following the decision to proceed with transcatheter aortic valve replacement, a discussion was held regarding what types of management strategies would be attempted intraoperatively in the event of life-threatening complications, including whether or not the patient would be considered a candidate for the use of cardiopulmonary bypass and/or conversion to open sternotomy for attempted surgical intervention. Given his advanced age and limited mobility I do not think he would be a candidate for emergent sternotomy to manage any intraoperative complications.  The patient has been advised of a variety of complications that might develop including but not limited to risks of death, stroke, paravalvular leak, aortic dissection or other major vascular complications, aortic annulus rupture, device embolization, cardiac rupture or perforation, mitral regurgitation, acute myocardial infarction, arrhythmia, heart block or bradycardia requiring  permanent pacemaker placement, congestive heart failure, respiratory failure, renal failure, pneumonia, infection, other late complications related to structural valve deterioration or migration, or other complications that might ultimately cause a temporary or permanent loss of functional independence or other long term morbidity. The  patient provides full informed consent for the procedure as described and all questions were answered.    DETAILS OF THE OPERATIVE PROCEDURE  PREPARATION:    The patient is brought to the operating room on the above mentioned date and appropriate monitoring was established by the anesthesia team. The patient is placed in the supine position on the operating table.  Intravenous antibiotics are administered. The patient is monitored closely throughout the procedure under conscious sedation.  Baseline transthoracic echocardiogram was performed. The patient's abdomen and both groins are prepared and draped in a sterile manner. A time out procedure is performed.   PERIPHERAL ACCESS:    Using the modified Seldinger technique, femoral arterial and venous access was obtained with placement of 6 Fr sheaths on the right side.  A pigtail diagnostic catheter was passed through the right arterial sheath under fluoroscopic guidance into the aortic root.  A temporary transvenous pacemaker catheter was passed through the right femoral venous sheath under fluoroscopic guidance into the right ventricle.  The pacemaker was tested to ensure stable lead placement and pacemaker capture. Aortic root angiography was performed in order to determine the optimal angiographic angle for valve deployment.   TRANSFEMORAL ACCESS:   Percutaneous transfemoral access and sheath placement was performed using ultrasound guidance.  The left common femoral artery was cannulated using a micropuncture needle and appropriate location was verified using hand injection angiogram.  A pair of [employer] Perclose  percutaneous closure devices were placed and a 6 French sheath replaced into the femoral artery.  The patient was heparinized systemically and ACT verified > 250 seconds.    A 16 Fr transfemoral E-sheath was introduced into the left common femoral artery after progressively dilating over an Amplatz superstiff wire. An AL-2 catheter was used to direct a straight-tip exchange length wire across the native aortic valve into the left ventricle. This was exchanged out for a pigtail catheter and position was confirmed in the LV apex. Simultaneous LV and Ao pressures were recorded.  The pigtail catheter was exchanged for an Amplatz Extra-stiff wire in the LV apex.     BALLOON AORTIC VALVULOPLASTY:   Not performed   TRANSCATHETER HEART VALVE DEPLOYMENT:   An Edwards Sapien 3 Ultra transcatheter heart valve (size 29 mm, model #9600TFX, serial [id number]) was prepared and crimped per manufacturer's guidelines, and the proper orientation of the valve is confirmed on the [employer] delivery system. The valve was advanced through the introducer sheath using normal technique until in an appropriate position in the abdominal aorta beyond the sheath tip. The balloon was then retracted and using the fine-tuning wheel was centered on the valve. The valve was then advanced across the aortic arch using appropriate flexion of the catheter. The valve was carefully positioned across the aortic valve annulus. The Commander catheter was retracted using normal technique. Once final position of the valve has been confirmed by angiographic assessment, the valve is deployed while temporarily holding ventilation and during rapid ventricular pacing to maintain systolic blood pressure < 50 mmHg and pulse pressure < 10 mmHg. The balloon inflation is held for >3 seconds after reaching full deployment volume. Once the balloon has fully deflated the balloon is retracted into the ascending aorta and valve function is assessed using  echocardiography. There is felt to be no paravalvular leak and no central aortic insufficiency.  The patient's hemodynamic recovery following valve deployment is good.  The deployment balloon and guidewire are both removed.    PROCEDURE COMPLETION:   The sheath  was removed and femoral artery closure performed.  Protamine was administered once femoral arterial repair was complete. The temporary pacemaker, pigtail catheters and femoral sheaths were removed with manual pressure used for hemostasis.  A Mynx femoral closure device was utilized following removal of the diagnostic sheath in the right femoral artery.  The patient tolerated the procedure well and is transported to the surgical intensive care in stable condition. There were no immediate intraoperative complications. All sponge instrument and needle counts are verified correct at completion of the operation.   No blood products were administered during the operation.  The patient received a total of 45 mL of intravenous contrast during the procedure.   Gaye Pollack, MD 09/22/2019

## 2019-09-22 NOTE — Discharge Instructions (Signed)
ACTIVITY AND EXERCISE °• Daily activity and exercise are an important part of your recovery. People recover at different rates depending on their general health and type of valve procedure. °• Most people recovering from TAVR feel better relatively quickly  °• No lifting, pushing, pulling more than 10 pounds (examples to avoid: groceries, vacuuming, gardening, golfing): °            - For one week with a procedure through the groin. °            - For six weeks for procedures through the chest wall or neck. °NOTE: You will typically see one of our providers 7-14 days after your procedure to discuss WHEN TO RESUME the above activities.  °  °  °DRIVING °• Do not drive until you are seen for follow up and cleared by a provider. Generally, we ask patient to not drive for 1 week after their procedure. °• If you have been told by your doctor in the past that you may not drive, you must talk with him/her before you begin driving again. °  °DRESSING °• Groin site: you may leave the clear dressing over the site for up to one week or until it falls off. °  °HYGIENE °• If you had a femoral (leg) procedure, you may take a shower when you return home. After the shower, pat the site dry. Do NOT use powder, oils or lotions in your groin area until the site has completely healed. °• If you had a chest procedure, you may shower when you return home unless specifically instructed not to by your discharging practitioner. °            - DO NOT scrub incision; pat dry with a towel. °            - DO NOT apply any lotions, oils, powders to the incision. °            - No tub baths / swimming for at least 2 weeks. °• If you notice any fevers, chills, increased pain, swelling, bleeding or pus, please contact your doctor. °  °ADDITIONAL INFORMATION °• If you are going to have an upcoming dental procedure, please contact our office as you will require antibiotics ahead of time to prevent infection on your heart valve.  ° ° °If you have any  questions or concerns you can call the structural heart phone during normal business hours 8am-4pm. If you have an urgent need after hours or weekends please call [phone] to talk to the on call provider for general cardiology. If you have an emergency that requires immediate attention, please call 911.  ° ° °After TAVR Checklist ° °Check  Test Description  ° Follow up appointment in 1-2 weeks  You will see our structural heart physician assistant, Katie Jaythan Hinely. Your incision sites will be checked and you will be cleared to drive and resume all normal activities if you are doing well.    ° 1 month echo and follow up  You will have an echo to check on your new heart valve and be seen back in the office by Katie Lindy Pennisi. Many times the echo is not read by your appointment time, but Katie will call you later that day or the following day to report your results.  ° Follow up with your primary cardiologist You will need to be seen by your primary cardiologist in the following 3-6 months after your 1 month appointment in the valve   clinic. Often times your Plavix or Aspirin will be discontinued during this time, but this is decided on a case by case basis.   ° 1 year echo and follow up You will have another echo to check on your heart valve after 1 year and be seen back in the office by Katie Deshon Koslowski. This your last structural heart visit.  ° Bacterial endocarditis prophylaxis  You will have to take antibiotics for the rest of your life before all dental procedures (even teeth cleanings) to protect your heart valve. Antibiotics are also required before some surgeries. Please check with your cardiologist before scheduling any surgeries. Also, please make sure to tell us if you have a penicillin allergy as you will require an alternative antibiotic.   ° ° °

## 2019-09-22 NOTE — Progress Notes (Signed)
Pt was given incentive spirometer as well as instructions for its use.

## 2019-09-22 NOTE — Anesthesia Preprocedure Evaluation (Addendum)
Anesthesia Evaluation  Patient identified by MRN, date of birth, ID band Patient awake    Reviewed: Allergy & Precautions, NPO status , Patient's Chart, lab work & pertinent test results  Airway Mallampati: II  TM Distance: >3 FB Neck ROM: Full    Dental  (+) Teeth Intact, Dental Advisory Given   Pulmonary asthma , former smoker,    breath sounds clear to auscultation       Cardiovascular hypertension, Pt. on home beta blockers +CHF   Rhythm:Regular Rate:Normal + Systolic murmurs    Neuro/Psych TIAnegative psych ROS   GI/Hepatic Neg liver ROS, GERD  Medicated,  Endo/Other  negative endocrine ROS  Renal/GU negative Renal ROS     Musculoskeletal  (+) Arthritis ,   Abdominal Normal abdominal exam  (+)   Peds  Hematology negative hematology ROS (+)   Anesthesia Other Findings   Reproductive/Obstetrics                            Echo: 1. The left ventricle has normal systolic function, with an ejection fraction of 55-60%. The cavity size was normal. There is moderately increased left ventricular wall thickness. Left ventricular diastolic function could not be evaluated.  2. The right ventricle has normal systolic function. The cavity was mildly enlarged. There is no increase in right ventricular wall thickness. Right ventricular systolic pressure is severely elevated with an estimated pressure of 71.9 mmHg.  3. Left atrial size was mildly dilated.  4. Right atrial size was moderately dilated.  5. The mitral valve is degenerative. Mild thickening of the mitral valve leaflet. Moderate calcification of the mitral valve leaflet. Mitral valve regurgitation is moderate by color flow Doppler.  6. The tricuspid valve is not well visualized. Tricuspid valve regurgitation is mild-moderate.  7. The aortic valve was not well visualized. Moderate thickening of the aortic valve. Aortic valve regurgitation is mild  by color flow Doppler. Severe stenosis of the aortic valve.  8. The aortic root is normal in size and structure.  Anesthesia Physical Anesthesia Plan  ASA: IV  Anesthesia Plan: MAC   Post-op Pain Management:    Induction: Intravenous  PONV Risk Score and Plan: Ondansetron, Propofol infusion and Treatment may vary due to age or medical condition  Airway Management Planned: Natural Airway and Simple Face Mask  Additional Equipment: None  Intra-op Plan:   Post-operative Plan:   Informed Consent: I have reviewed the patients History and Physical, chart, labs and discussed the procedure including the risks, benefits and alternatives for the proposed anesthesia with the patient or authorized representative who has indicated his/her understanding and acceptance.     Dental advisory given  Plan Discussed with: CRNA  Anesthesia Plan Comments:        Anesthesia Quick Evaluation

## 2019-09-22 NOTE — Progress Notes (Signed)
Pt ambulated 800 ft with front wheel walker, tolerated well. Bilateral groin level zero, bilateral pedal pulses palpable. Will continue to monitor.

## 2019-09-22 NOTE — Transfer of Care (Signed)
Immediate Anesthesia Transfer of Care Note  Patient: DRESEAN BECKEL  Procedure(s) Performed: TRANSCATHETER AORTIC VALVE REPLACEMENT, TRANSFEMORAL (N/A ) TRANSESOPHAGEAL ECHOCARDIOGRAM (TEE) (N/A )  Patient Location: Cath Lab  Anesthesia Type:MAC  Level of Consciousness: awake, alert  and oriented  Airway & Oxygen Therapy: Patient Spontanous Breathing and Patient connected to nasal cannula oxygen  Post-op Assessment: Report given to RN and Post -op Vital signs reviewed and stable  Post vital signs: Reviewed and stable  Last Vitals:  Vitals Value Taken Time  BP 129/71 09/22/19 1152  Temp 36.6 C 09/22/19 1154  Pulse 77 09/22/19 1155  Resp 16 09/22/19 1155  SpO2 96 % 09/22/19 1155  Vitals shown include unvalidated device data.  Last Pain:  Vitals:   09/22/19 1154  TempSrc: Temporal  PainSc: Asleep      Patients Stated Pain Goal: 3 (02/33/43 5686)  Complications: No apparent anesthesia complications

## 2019-09-23 ENCOUNTER — Encounter (HOSPITAL_COMMUNITY): Payer: Self-pay | Admitting: Cardiovascular Disease

## 2019-09-23 ENCOUNTER — Inpatient Hospital Stay (HOSPITAL_COMMUNITY): Payer: Medicare Other

## 2019-09-23 DIAGNOSIS — Z952 Presence of prosthetic heart valve: Secondary | ICD-10-CM

## 2019-09-23 DIAGNOSIS — I350 Nonrheumatic aortic (valve) stenosis: Secondary | ICD-10-CM

## 2019-09-23 DIAGNOSIS — Z954 Presence of other heart-valve replacement: Secondary | ICD-10-CM

## 2019-09-23 LAB — BASIC METABOLIC PANEL
Anion gap: 5 (ref 5–15)
BUN: 17 mg/dL (ref 8–23)
CO2: 21 mmol/L — ABNORMAL LOW (ref 22–32)
Calcium: 8.1 mg/dL — ABNORMAL LOW (ref 8.9–10.3)
Chloride: 111 mmol/L (ref 98–111)
Creatinine, Ser: 1.23 mg/dL (ref 0.61–1.24)
GFR calc Af Amer: >60 mL/min (ref >60)
GFR calc non Af Amer: 52 mL/min — ABNORMAL LOW (ref >60)
Glucose, Bld: 220 mg/dL — ABNORMAL HIGH (ref 70–99)
Potassium: 4.2 mmol/L (ref 3.5–5.1)
Sodium: 137 mmol/L (ref 135–145)

## 2019-09-23 LAB — CBC
HCT: 37.6 % — ABNORMAL LOW (ref 39.0–52.0)
Hemoglobin: 12.9 g/dL — ABNORMAL LOW (ref 13.0–17.0)
MCH: 31.5 pg (ref 26.0–34.0)
MCHC: 34.3 g/dL (ref 30.0–36.0)
MCV: 91.7 fL (ref 80.0–100.0)
Platelets: 99 10*3/uL — ABNORMAL LOW (ref 150–400)
RBC: 4.1 MIL/uL — ABNORMAL LOW (ref 4.22–5.81)
RDW: 12.5 % (ref 11.5–15.5)
WBC: 10.8 10*3/uL — ABNORMAL HIGH (ref 4.0–10.5)
nRBC: 0 % (ref 0.0–0.2)

## 2019-09-23 LAB — ECHOCARDIOGRAM LIMITED: Weight: 3139.35 oz

## 2019-09-23 LAB — GLUCOSE, CAPILLARY
Glucose-Capillary: 176 mg/dL — ABNORMAL HIGH (ref 70–99)
Glucose-Capillary: 231 mg/dL — ABNORMAL HIGH (ref 70–99)

## 2019-09-23 LAB — MAGNESIUM: Magnesium: 2 mg/dL (ref 1.7–2.4)

## 2019-09-23 MED ORDER — FUROSEMIDE 10 MG/ML IJ SOLN
20.0000 mg | Freq: Once | INTRAMUSCULAR | Status: AC
  Administered 2019-09-23: 11:00:00 20 mg via INTRAVENOUS
  Filled 2019-09-23: qty 2

## 2019-09-23 MED ORDER — FUROSEMIDE 40 MG PO TABS: 20.0000 mg | ORAL_TABLET | Freq: Every day | ORAL | Status: DC

## 2019-09-23 MED ORDER — POTASSIUM CHLORIDE CRYS ER 10 MEQ PO TBCR
10.0000 meq | EXTENDED_RELEASE_TABLET | Freq: Once | ORAL | Status: AC
  Administered 2019-09-23: 11:00:00 10 meq via ORAL
  Filled 2019-09-23: qty 1

## 2019-09-23 MED ORDER — ASPIRIN 81 MG PO CHEW: 81.0000 mg | CHEWABLE_TABLET | Freq: Every day | ORAL | Status: DC

## 2019-09-23 MED FILL — Lidocaine HCl Local Inj 1%: INTRAMUSCULAR | Qty: 20 | Status: AC

## 2019-09-23 NOTE — Discharge Summary (Addendum)
Jacob Simpson Jacob Simpson  Discharge Summary    Patient ID: Jacob Simpson MRN: [id number]; DOB: [birth year]  Admit date: 09/22/2019 Discharge date: 09/23/2019  Primary Care Provider: Valera Castle, MD  Primary Cardiologist: Dr. Fletcher Anon / Dr. Burt Knack & Dr. Cyndia Bent (TAVR)  Discharge Diagnoses    Principal Problem:   S/P TAVR (transcatheter aortic Jacob replacement) Active Problems:   Acute on chronic diastolic heart failure (HCC)   HTN (hypertension)   Severe aortic stenosis   Radiculitis, lumbosacral   PAF (paroxysmal atrial fibrillation) (HCC)   Lymphoma (HCC)   Carotid artery disease (HCC)   History of TIA (transient ischemic attack)   Allergies Allergies  Allergen Reactions   Penicillins Rash    Did it involve swelling of the face/tongue/throat, SOB, or low BP? Unknown Did it involve sudden or severe rash/hives, skin peeling, or any reaction on the inside of your mouth or nose? Unknown Did you need to seek medical attention at a hospital or doctor's office? Unknown When did it last happen?Unknown If all above answers are NO, may proceed with cephalosporin use.     Diagnostic Studies/Procedures     TAVR OPERATIVE NOTE Date of Procedure:                09/22/2019  Preoperative Diagnosis:      Severe Aortic Stenosis   Postoperative Diagnosis:    Same   Procedure:        Transcatheter Aortic Jacob Replacement - Percutaneous Left Transfemoral Approach             Edwards Sapien 3 THV (size 29 mm, model # 9600TFX, serial # [id number])              Co-Surgeons:                        Gaye Pollack, MD and Sherren Mocha, MD   Anesthesiologist:                  Wilfrid Lund, MD  Echocardiographer:              Bertrum Sol, MD  Pre-operative Echo Findings: ? Severe aortic stenosis ? Normal left ventricular systolic function  Post-operative Echo Findings: ? No paravalvular leak ? Normal left ventricular  systolic function   _____________   Echo 09/23/19: IMPRESSIONS  1. Left ventricular ejection fraction, by visual estimation, is 55 to 60%. The left ventricle has normal function. Left ventricular septal wall thickness was moderately increased. Normal left ventricular posterior wall thickness. There is moderately  increased left ventricular hypertrophy.  2. Global right ventricle has normal systolic function.The right ventricular size is normal. No increase in right ventricular wall thickness.  3. Left atrial size was elongated.  4. Right atrial size was normal.  5. The pericardial effusion is posterior to the left ventricle.  6. Trivial pericardial effusion is present.  7. Moderate calcification of the mitral Jacob leaflet(s).  8. Moderate mitral annular calcification.  9. Moderate thickening of the mitral Jacob leaflet(s). 10. The mitral Jacob is degenerative. Moderate mitral Jacob regurgitation. 11. The tricuspid Jacob is normal in structure. Tricuspid Jacob regurgitation is mild. 12. Aortic Jacob regurgitation was not visualized by color flow Doppler. 13. Post TAVR with 29 mm Sapien 3 Ultra Jacob well positioned no PVL stable low gradients. 14. The pulmonic Jacob was grossly normal. Pulmonic Jacob regurgitation is mild by color flow Doppler. 15. Mildly elevated  pulmonary artery systolic pressure.  History of Present Illness     Jacob Simpson is a 85 y.o. male with a history of B cell lymphoma s/p chemo, recurrent PNA, carotid artery disease (40-59% RICA occl), chronic diastolic CHF, PAF on Eliquis, HTN, TIA, radiculopathy requiring spinal injections, moderate MR and severe AS who presented to [facility] on 09/22/19 for planned TAVR.   The patient has a history of aortic stenosis followed by Dr. Fletcher Anon. His most recent echocardiogram on 07/06/2019 showed a mean gradient across the aortic Jacob of 58 mmHg with a peak gradient of 72 mmHg.  Aortic Jacob area was 0.63 cm.  Left ventricular systolic  function was normal at 55 to 60%. There is degenerative mitral Jacob disease with moderate mitral regurgitation. Cardiac catheterization on 08/10/2019 showed minimal nonobstructive coronary disease. The aortic Jacob was not crossed. Right heart catheterization did show severe pulmonary hypertension with a PA pressure of 74/30 with a mean of 50. Cardiac index was 2.09.  Pulmonary wedge pressure was 33 mm with prominent V waves suggesting mitral regurgitation. He has had progressive dyspnea, cough and bendopnea.  The patient has been evaluated by the multidisciplinary Jacob Simpson and felt to have severe, symptomatic aortic stenosis and to be a suitable candidate for TAVR, which was set up for 09/22/19.    Hospital Course     Consultants: none   Severe AS: s/p successful TAVR with a 29 mm Edwards Sapien 3 THV via the TF approach on 09/22/19. Post operative echo showed EF 55%, normally functioning TAVR with mean gradient 5.3 mm Hg and no PVL. Groin sites are stable. ECG with sinus and no high grade heart block. Continue home Eliquis and will start a baby aspirin 81mg  daily x 5months. I will see him back next week for close follow up.    Acute on chronic diastolic CHF: as evidenced by an elevated BNP on pre admission lab work. He has mild volume overload with bilateral LE edema. He was given one dose of IV lasix 20mg  prior to discharge and will resume home lasix 30mg  daily starting tomorrow. Will check a BMET next week in the office.   PAF: maintaining NSR. Resume home Eliquis 2.5mg  BID. Currently he qualifies for the higher dose based on age, weight and renal function. But given labile renal function and need for concomitant aspirin, I will leave as is.   Carotid artery disease: 40-59% RICA stenosis. Continue medical therapy.   HTN: BP has been well controlled. Resume home meds.   Thrombocytopenia: mild at 99. Expected post operatively.  _____________  Discharge Vitals Blood pressure 127/89, pulse 92,  temperature 97.6 F (36.4 C), temperature source Oral, resp. rate 20, weight 89 kg, SpO2 99 %.  Filed Weights   09/23/19 0605  Weight: 89 kg    Labs & Radiologic Studies    CBC Recent Labs    09/22/19 1211 09/23/19 0123  WBC  --  10.8*  HGB 11.6* 12.9*  HCT 34.0* 37.6*  MCV  --  91.7  PLT  --  99*   Basic Metabolic Panel Recent Labs    09/22/19 1211 09/23/19 0123  NA 142 137  K 3.8 4.2  CL 109 111  CO2  --  21*  GLUCOSE 198* 220*  BUN 17 17  CREATININE 0.90 1.23  CALCIUM  --  8.1*  MG  --  2.0   Liver Function Tests No results for input(s): AST, ALT, ALKPHOS, BILITOT, PROT, ALBUMIN in the last 72 hours.  No results for input(s): LIPASE, AMYLASE in the last 72 hours. Cardiac Enzymes No results for input(s): CKTOTAL, CKMB, CKMBINDEX, TROPONINI in the last 72 hours. BNP Invalid input(s): POCBNP D-Dimer No results for input(s): DDIMER in the last 72 hours. Hemoglobin A1C No results for input(s): HGBA1C in the last 72 hours. Fasting Lipid Panel No results for input(s): CHOL, HDL, LDLCALC, TRIG, CHOLHDL, LDLDIRECT in the last 72 hours. Thyroid Function Tests No results for input(s): TSH, T4TOTAL, T3FREE, THYROIDAB in the last 72 hours.  Invalid input(s): FREET3 _____________  Dg Chest 2 View  Result Date: 09/18/2019 CLINICAL DATA:  Pre-op exam; severe aortic stenosis. Hx of TIA, pneumonia, hypertension, heart murmur, CHF, asthma, aortic aneurysm, afib, right/left heart cath and coronary angiography(08/10/2019). Former [phone]). EXAM: CHEST - 2 VIEW COMPARISON:  Chest radiograph 07/07/2019 FINDINGS: Stable cardiomediastinal contours with enlarged heart size. Minimal bibasilar opacities likely reflect atelectasis or scarring. No new focal pulmonary opacity. No pneumothorax or pleural effusion. No acute finding in the visualized skeleton. IMPRESSION: Minimal bibasilar scarring or atelectasis.  No new acute finding. Electronically Signed   By: Audie Pinto M.D.    On: 09/18/2019 15:10   Ct Coronary Morph W/cta Cor W/score W/ca W/cm &/or Wo/cm  Addendum Date: 08/27/2019   ADDENDUM REPORT: 08/27/2019 13:22 ADDENDUM: Extracardiac findings will be described separately under dictation for contemporaneously obtained CTA chest, abdomen and pelvis 08/27/2019, please see that dictation for full details. Electronically Signed   By: Vinnie Langton M.D.   On: 08/27/2019 13:22   Result Date: 08/27/2019 CLINICAL DATA:  Aortic Stenosis EXAM: Cardiac TAVR CT TECHNIQUE: The patient was scanned on a Siemens Force [id number] slice scanner. A 120 kV retrospective scan was triggered in the ascending thoracic aorta at 140 HU's. Gantry rotation speed was 250 msecs and collimation was .6 mm. No beta blockade or nitro were given. The 3D data set was reconstructed in 5% intervals of the R-R cycle. Systolic and diastolic phases were analyzed on a dedicated work station using MPR, MIP and VRT modes. The patient received 80 cc of contrast. FINDINGS: Aortic Jacob: Tri leaflet with severely thickened leaflet tips and calcification and restricted leaflet motion Aorta: Mild root dilatation Normal arch vessels mild calcific atherosclerosis Sino-tubular Junction: 32 mm Ascending Thoracic Aorta: 38 mm Aortic Arch: 32 mm Descending Thoracic Aorta: 22 mm Sinus of Valsalva Measurements: Non-coronary: 34.5 mm Right - coronary: 32.6 mm Left -   coronary: 34 mm Coronary Artery Height above Annulus: Left Main: 16.7 mm above annulus Right Coronary: 16.8 mm above annulus Virtual Basal Annulus Measurements: Maximum / Minimum Diameter: 29.1 mm x 25.7 mm Perimeter: 87 mm Area: 597 mm2 Coronary Arteries: Sufficient height above annulus for deployment Optimum Fluoroscopic Angle for Delivery: LAO 3 Caudal 16 degrees IMPRESSION: 1. Tri leaflet AV with annular area of 597 mm2 suitable for a 29 mm Sapien 3 Jacob 2.  Coronary arteries sufficient height above annulus for deployment 3.  Mild aortic root dilatation 3.8 cm 4.  Optimum angiographic angle for deployment LAO 3 Caudal 16 degrees Jenkins Rouge Electronically Signed: By: Jenkins Rouge M.D. On: 08/27/2019 13:10   Dg Chest Port 1 View  Result Date: 09/22/2019 CLINICAL DATA:  Status post transcatheter aortic Jacob replacement EXAM: PORTABLE CHEST 1 VIEW COMPARISON:  September 18, 2019 FINDINGS: The heart size is enlarged. A trans catheter aortic Jacob replacement is identified. The mediastinal contour is normal. Mild patchy consolidation of left lung base is identified. There is no pulmonary edema. Possible minimal left pleural  effusion is noted. Bony structures are stable. IMPRESSION: Transcatheter aortic valvular replacement identified. No pneumothorax. Mild patchy consolidation of left lung base is noted with probable minimal left pleural effusion. Findings may be in part due to atelectasis. Electronically Signed   By: Abelardo Diesel M.D.   On: 09/22/2019 13:56   Ct Angio Chest Aorta W &/or Wo Contrast  Result Date: 08/27/2019 CLINICAL DATA:  85 year old male with history of aortic stenosis. Preprocedural study prior to potential transcatheter aortic Jacob replacement (TAVR) procedure. EXAM: CT ANGIOGRAPHY CHEST, ABDOMEN AND PELVIS TECHNIQUE: Multidetector CT imaging through the chest, abdomen and pelvis was performed using the standard protocol during bolus administration of intravenous contrast. Multiplanar reconstructed images and MIPs were obtained and reviewed to evaluate the vascular anatomy. CONTRAST:  149mL OMNIPAQUE IOHEXOL 350 MG/ML SOLN COMPARISON:  No priors. FINDINGS: CTA CHEST FINDINGS Cardiovascular: Heart size is borderline enlarged. There is no significant pericardial fluid, thickening or pericardial calcification. There is aortic atherosclerosis, as well as atherosclerosis of the great vessels of the mediastinum and the coronary arteries, including calcified atherosclerotic plaque in the left anterior descending, left circumflex and right coronary arteries.  Severe thickening calcification of the aortic Jacob. Mild calcifications of the mitral Jacob. Mediastinum/Lymph Nodes: Multiple prominent borderline enlarged mediastinal and bilateral hilar lymph nodes, measuring up to 1.2 cm in short axis in the low right paratracheal nodal station, nonspecific. Esophagus is unremarkable in appearance. No axillary lymphadenopathy. Lungs/Pleura: Areas of scarring or mild fibrosis in the lung bases bilaterally. No acute consolidative airspace disease. No suspicious appearing pulmonary nodules or masses are noted. Small bilateral pleural effusions lying dependently. Musculoskeletal/Soft Tissues: There are no aggressive appearing lytic or blastic lesions noted in the visualized portions of the skeleton. CTA ABDOMEN AND PELVIS FINDINGS Hepatobiliary: Small calcified granuloma in segment 3 of the liver. No suspicious cystic or solid hepatic lesions. No intra or extrahepatic biliary ductal dilatation. Small calcified gallstones lying dependently in the gallbladder measuring up to 6 mm. No findings to suggest an acute cholecystitis at this time. Pancreas: No pancreatic mass. No pancreatic ductal dilatation. No pancreatic or peripancreatic fluid collections or inflammatory changes. Spleen: Unremarkable. Adrenals/Urinary Tract: 2 mm nonobstructive calculus in the lower pole collecting system of the right kidney. Bilateral kidneys and adrenal glands are otherwise unremarkable in appearance. No hydroureteronephrosis. Urinary bladder is normal in appearance. Stomach/Bowel: Normal appearance of the stomach. No pathologic dilatation of small bowel or colon. Numerous colonic diverticulae are noted, particularly in the descending colon and sigmoid colon, without surrounding inflammatory changes to suggest an acute diverticulitis at this time. Normal appendix. Vascular/Lymphatic: Aortic atherosclerosis, without evidence of aneurysm or dissection in the abdominal or pelvic vasculature. Vascular  findings and measurements pertinent to potential TAVR procedure, as detailed below. No lymphadenopathy noted in the abdomen or pelvis. Reproductive: Prostate gland and seminal vesicles are unremarkable in appearance. Other: No significant volume of ascites.  No pneumoperitoneum. Musculoskeletal: Status post PLIF from L4-S1. There are no aggressive appearing lytic or blastic lesions noted in the visualized portions of the skeleton. VASCULAR MEASUREMENTS PERTINENT TO TAVR: AORTA: Minimal Aortic Diameter-14 x 18 mm Severity of Aortic Calcification-severe RIGHT PELVIS: Right Common Iliac Artery - Minimal Diameter-11.6 x 10.6 mm Tortuosity-mild Calcification-moderate Right External Iliac Artery - Minimal Diameter-8.1 x 6.3 mm Tortuosity-severe Calcification-none Right Common Femoral Artery - Minimal Diameter-8.6 x 8.4 mm Tortuosity-mild Calcification-mild-to-moderate LEFT PELVIS: Left Common Iliac Artery - Minimal Diameter-11.4 x 12.1 mm Tortuosity-mild Calcification-moderate Left External Iliac Artery - Minimal Diameter-7.8 x 8.0 mm Tortuosity-severe  Calcification-none Left Common Femoral Artery - Minimal Diameter-7.9 x 8.2 mm Tortuosity-mild Calcification-mild-to-moderate Review of the MIP images confirms the above findings. IMPRESSION: 1. Vascular findings and measurements pertinent to potential TAVR procedure, as detailed above. 2. Severe thickening calcification of the aortic Jacob, compatible with the reported clinical history of severe aortic stenosis. 3. Aortic atherosclerosis, in addition to 3 vessel coronary artery disease. 4. Cholelithiasis without evidence of acute cholecystitis at this time. 5. 2 mm nonobstructive calculus in the lower pole collecting system of the right kidney. No ureteral stones or findings of urinary tract obstruction. 6. Extensive colonic diverticulosis without evidence of acute diverticulitis at this time. 7. Additional incidental findings, as above. Electronically Signed   By: Vinnie Langton M.D.   On: 08/27/2019 16:11   Vas US Carotid  Result Date: 08/27/2019 Carotid Arterial Duplex Study Risk Factors:      Hypertension. Other Factors:     Aortic stenosis. Pre operative TAVR, atrial fibrillation,                    TIA. Limitations        Today's exam was limited due to dampened waveforms secondary                    to severe Aortic Stenosis. Comparison Study:  No prior study on file Performing Technologist: Sharion Dove RVS  Examination Guidelines: A complete evaluation includes B-mode imaging, spectral Doppler, color Doppler, and power Doppler as needed of all accessible portions of each vessel. Bilateral testing is considered an integral part of a complete examination. Limited examinations for reoccurring indications may be performed as noted.  Right Carotid Findings: +----------+--------+--------+--------+------------------+------------------+             PSV cm/s EDV cm/s Stenosis Plaque Description Comments            +----------+--------+--------+--------+------------------+------------------+  CCA Prox   82       22                                   intimal thickening  +----------+--------+--------+--------+------------------+------------------+  CCA Distal 93       24                                   intimal thickening  +----------+--------+--------+--------+------------------+------------------+  ICA Prox   144      44       40-59%   calcific                               +----------+--------+--------+--------+------------------+------------------+  ICA Mid    105      40                                                       +----------+--------+--------+--------+------------------+------------------+  ICA Distal 171      56                                                       +----------+--------+--------+--------+------------------+------------------+  ECA        58       4                                                         +----------+--------+--------+--------+------------------+------------------+ +----------+--------+-------+------------+-------------------+             PSV cm/s EDV cms Describe     Arm Pressure (mmHG)  +----------+--------+-------+------------+-------------------+  Subclavian                  Not assessed                      +----------+--------+-------+------------+-------------------+ +---------+--------+--------+----------+  Vertebral PSV cm/s EDV cm/s Retrograde  +---------+--------+--------+----------+  Left Carotid Findings: +----------+--------+--------+--------+------------------+------------------+             PSV cm/s EDV cm/s Stenosis Plaque Description Comments            +----------+--------+--------+--------+------------------+------------------+  CCA Prox   74       4                                    intimal thickening  +----------+--------+--------+--------+------------------+------------------+  CCA Distal 43       3                 calcific           intimal thickening  +----------+--------+--------+--------+------------------+------------------+  ICA Prox   18       4                                                        +----------+--------+--------+--------+------------------+------------------+  ICA Distal 49       13                                                       +----------+--------+--------+--------+------------------+------------------+  ECA        195      4                                                        +----------+--------+--------+--------+------------------+------------------+ +---------+--------+--+--------+--+  Vertebral PSV cm/s 57 EDV cm/s 10  +---------+--------+--+--------+--+  Summary: Right Carotid: Velocities in the right ICA are consistent with a 40-59%                stenosis. Left Carotid: Velocities in the left ICA are consistent with a 1-39% stenosis. Vertebrals:  Left vertebral artery demonstrates antegrade flow. Right vertebral              artery  demonstrates retrograde flow. Subclavians: Bilateral subclavian arteries were not visualized. *See table(s) above for measurements and observations.  Electronically signed by Monica Martinez MD on 08/27/2019 at 5:22:53 PM.  Final    Ct Angio Abdomen Pelvis  W &/or Wo Contrast  Result Date: 08/27/2019 CLINICAL DATA:  85 year old male with history of aortic stenosis. Preprocedural study prior to potential transcatheter aortic Jacob replacement (TAVR) procedure. EXAM: CT ANGIOGRAPHY CHEST, ABDOMEN AND PELVIS TECHNIQUE: Multidetector CT imaging through the chest, abdomen and pelvis was performed using the standard protocol during bolus administration of intravenous contrast. Multiplanar reconstructed images and MIPs were obtained and reviewed to evaluate the vascular anatomy. CONTRAST:  162mL OMNIPAQUE IOHEXOL 350 MG/ML SOLN COMPARISON:  No priors. FINDINGS: CTA CHEST FINDINGS Cardiovascular: Heart size is borderline enlarged. There is no significant pericardial fluid, thickening or pericardial calcification. There is aortic atherosclerosis, as well as atherosclerosis of the great vessels of the mediastinum and the coronary arteries, including calcified atherosclerotic plaque in the left anterior descending, left circumflex and right coronary arteries. Severe thickening calcification of the aortic Jacob. Mild calcifications of the mitral Jacob. Mediastinum/Lymph Nodes: Multiple prominent borderline enlarged mediastinal and bilateral hilar lymph nodes, measuring up to 1.2 cm in short axis in the low right paratracheal nodal station, nonspecific. Esophagus is unremarkable in appearance. No axillary lymphadenopathy. Lungs/Pleura: Areas of scarring or mild fibrosis in the lung bases bilaterally. No acute consolidative airspace disease. No suspicious appearing pulmonary nodules or masses are noted. Small bilateral pleural effusions lying dependently. Musculoskeletal/Soft Tissues: There are no aggressive appearing  lytic or blastic lesions noted in the visualized portions of the skeleton. CTA ABDOMEN AND PELVIS FINDINGS Hepatobiliary: Small calcified granuloma in segment 3 of the liver. No suspicious cystic or solid hepatic lesions. No intra or extrahepatic biliary ductal dilatation. Small calcified gallstones lying dependently in the gallbladder measuring up to 6 mm. No findings to suggest an acute cholecystitis at this time. Pancreas: No pancreatic mass. No pancreatic ductal dilatation. No pancreatic or peripancreatic fluid collections or inflammatory changes. Spleen: Unremarkable. Adrenals/Urinary Tract: 2 mm nonobstructive calculus in the lower pole collecting system of the right kidney. Bilateral kidneys and adrenal glands are otherwise unremarkable in appearance. No hydroureteronephrosis. Urinary bladder is normal in appearance. Stomach/Bowel: Normal appearance of the stomach. No pathologic dilatation of small bowel or colon. Numerous colonic diverticulae are noted, particularly in the descending colon and sigmoid colon, without surrounding inflammatory changes to suggest an acute diverticulitis at this time. Normal appendix. Vascular/Lymphatic: Aortic atherosclerosis, without evidence of aneurysm or dissection in the abdominal or pelvic vasculature. Vascular findings and measurements pertinent to potential TAVR procedure, as detailed below. No lymphadenopathy noted in the abdomen or pelvis. Reproductive: Prostate gland and seminal vesicles are unremarkable in appearance. Other: No significant volume of ascites.  No pneumoperitoneum. Musculoskeletal: Status post PLIF from L4-S1. There are no aggressive appearing lytic or blastic lesions noted in the visualized portions of the skeleton. VASCULAR MEASUREMENTS PERTINENT TO TAVR: AORTA: Minimal Aortic Diameter-14 x 18 mm Severity of Aortic Calcification-severe RIGHT PELVIS: Right Common Iliac Artery - Minimal Diameter-11.6 x 10.6 mm Tortuosity-mild Calcification-moderate  Right External Iliac Artery - Minimal Diameter-8.1 x 6.3 mm Tortuosity-severe Calcification-none Right Common Femoral Artery - Minimal Diameter-8.6 x 8.4 mm Tortuosity-mild Calcification-mild-to-moderate LEFT PELVIS: Left Common Iliac Artery - Minimal Diameter-11.4 x 12.1 mm Tortuosity-mild Calcification-moderate Left External Iliac Artery - Minimal Diameter-7.8 x 8.0 mm Tortuosity-severe Calcification-none Left Common Femoral Artery - Minimal Diameter-7.9 x 8.2 mm Tortuosity-mild Calcification-mild-to-moderate Review of the MIP images confirms the above findings. IMPRESSION: 1. Vascular findings and measurements pertinent to potential TAVR procedure, as detailed above. 2. Severe thickening calcification of the aortic Jacob, compatible with the reported clinical  history of severe aortic stenosis. 3. Aortic atherosclerosis, in addition to 3 vessel coronary artery disease. 4. Cholelithiasis without evidence of acute cholecystitis at this time. 5. 2 mm nonobstructive calculus in the lower pole collecting system of the right kidney. No ureteral stones or findings of urinary tract obstruction. 6. Extensive colonic diverticulosis without evidence of acute diverticulitis at this time. 7. Additional incidental findings, as above. Electronically Signed   By: Vinnie Langton M.D.   On: 08/27/2019 16:11   Disposition   Pt is being discharged home today in good condition.  Follow-up Plans & Appointments    Follow-up Information    Eileen Stanford, PA-C. Go on 10/01/2019.   Specialties: Cardiology, Radiology Why: @ 1pm, please arrive at least 10 minutes early Contact information: [address] [phone]            Discharge Medications   Allergies as of 09/23/2019      Reactions   Penicillins Rash   Did it involve swelling of the face/tongue/throat, SOB, or low BP? Unknown Did it involve sudden or severe rash/hives, skin peeling, or any reaction on the  inside of your mouth or nose? Unknown Did you need to seek medical attention at a hospital or doctor's office? Unknown When did it last happen?Unknown If all above answers are NO, may proceed with cephalosporin use.      Medication List    TAKE these medications   acetaminophen 500 MG tablet Commonly known as: TYLENOL Take 1,000 mg by mouth every 6 (six) hours as needed for mild pain, moderate pain or fever.   apixaban 2.5 MG Tabs tablet Commonly known as: Eliquis Take 1 tablet (2.5 mg total) by mouth 2 (two) times daily.   aspirin 81 MG chewable tablet Chew 1 tablet (81 mg total) by mouth daily. Start taking on: September 24, 2019   atorvastatin 40 MG tablet Commonly known as: LIPITOR Take 40 mg by mouth daily.   azelastine 0.1 % nasal spray Commonly known as: ASTELIN Place 1 spray into both nostrils 2 (two) times daily as needed for rhinitis or allergies. Use in each nostril as directed   cetirizine 10 MG tablet Commonly known as: ZYRTEC Take 10 mg by mouth daily.   famotidine 10 MG tablet Commonly known as: PEPCID Take 10 mg by mouth daily as needed for heartburn or indigestion.   fluticasone 110 MCG/ACT inhaler Commonly known as: FLOVENT HFA Inhale 1 puff into the lungs 2 (two) times daily as needed (respiratory symptoms).   furosemide 40 MG tablet Commonly known as: Lasix Take 0.5 tablets (20 mg total) by mouth daily. Notes to patient: Start taking tomorrow 10/8   gabapentin 100 MG capsule Commonly known as: NEURONTIN Take 1 capsule (100 mg total) by mouth 3 (three) times daily as needed (back pain). What changed:   how much to take  when to take this   guaiFENesin 600 MG 12 hr tablet Commonly known as: MUCINEX Take 600 mg by mouth 2 (two) times daily as needed for cough or to loosen phlegm.   Ipratropium-Albuterol 20-100 MCG/ACT Aers respimat Commonly known as: COMBIVENT Inhale 1 puff into the lungs every 6 (six) hours. What changed:   when  to take this  reasons to take this   lactulose 10 GM/15ML solution Commonly known as: CHRONULAC Take 20 g by mouth daily as needed for moderate constipation.   meclizine 12.5 MG tablet Commonly known as: ANTIVERT Take 12.5 mg by  mouth 3 (three) times daily as needed for dizziness.   metoprolol succinate 50 MG 24 hr tablet Commonly known as: TOPROL-XL Take 1 tablet (50 mg total) by mouth daily. Take with or immediately following a meal.   mirtazapine 7.5 MG tablet Commonly known as: REMERON Take 7.5 mg by mouth at bedtime.   omeprazole 20 MG capsule Commonly known as: PRILOSEC Take 20 mg by mouth daily as needed (stomach acid).   ondansetron 8 MG disintegrating tablet Commonly known as: ZOFRAN-ODT Take 8 mg by mouth every 8 (eight) hours as needed for nausea or vomiting.   polyethylene glycol 17 g packet Commonly known as: MiraLax Take 17 g by mouth daily as needed for moderate constipation.   tamsulosin 0.4 MG Caps capsule Commonly known as: Flomax Take 1 capsule (0.4 mg total) by mouth daily after supper.        Outstanding Labs/Studies   BMET, CBC  Duration of Discharge Encounter   Greater than 30 minutes including physician time.  SignedAngelena Form, PA-C 09/23/2019, 12:22 PM [phone]

## 2019-09-23 NOTE — Progress Notes (Signed)
Inpatient Diabetes Program Recommendations  AACE/ADA: New Consensus Statement on Inpatient Glycemic Control   Target Ranges:  Prepandial:   less than 140 mg/dL      Peak postprandial:   less than 180 mg/dL (1-2 hours)      Critically ill patients:  140 - 180 mg/dL  Results for Jacob Simpson, Jacob Simpson (MRN [id number]) as of 09/23/2019 08:22  Ref. Range 09/22/2019 07:59 09/22/2019 15:46 09/22/2019 21:34 09/23/2019 06:36  Glucose-Capillary Latest Ref Range: 70 - 99 mg/dL 187 (H) 202 (H) 266 (H) 176 (H)   Results for Jacob Simpson, Jacob Simpson (MRN [id number]) as of 09/23/2019 08:22  Ref. Range 09/18/2019 12:30  Hemoglobin A1C Latest Ref Range: 4.8 - 5.6 % 7.7 (H)   Review of Glycemic Control Diabetes history: DM2 Outpatient Diabetes medications: None; was on Metformin 500 mg daily in the past (PCP discontinued 8-12 months ago) Current orders for Inpatient glycemic control: Novolog 0-24 units ACHS  Inpatient Diabetes Program Recommendations:    A1C: A1C 7.7% on 09/18/19 indicating an average glucose of 174 mg/dl over the past 2-3 months. Patient's daughter [name] states that patient can resume Metformin if prescribed at discharge (but asked that the extended release version NOT be prescribed). Will need a prescription for glucose monitoring kit as well if MD wants patient to check glucose at home.  NOTE: Patient received Decadron 4 mg at 10:31 on 09/22/19 which is contributing to hyperglycemia. A1C 7.7% on 09/18/19 indicating an average glucose of 174 mg/dl. Given patient's age would allow a higher A1C goal than usual (such as 7.5 or 8%).  If MD decides to discharge patient on an oral DM medication, could resume Metformin 500 mg daily and have patient follow up with PCP. Would not recommend using a DM medication that has a risk of hypoglycemia.  Thanks, Barnie Alderman, RN, MSN, CDE Diabetes Coordinator Inpatient Diabetes Program [phone] (Team Pager from 8am to 5pm)

## 2019-09-23 NOTE — Progress Notes (Signed)
Echocardiogram 2D Echocardiogram limited post TAVR has been performed.  Jacob Simpson M 09/23/2019, 8:09 AM

## 2019-09-23 NOTE — Progress Notes (Signed)
CARDIAC REHAB PHASE I   Offered to walk with pt. Pt states he has been ambulating today without difficulty. Pt states SOB is much improved than prior to surgery. Pt states a recent fall when he missed the chair sitting, states he is extra careful when doing so since. Stressed importance of safety with pt, encouraged ambulation as able. Reviewed site care and restrictions. Pt states he has a walker for home use along with an aide during the week and very supportive daughters. Pt set up for lunch. Call bell within reach.  [id number] Rufina Falco, RN BSN 09/23/2019 11:54 AM

## 2019-09-23 NOTE — Progress Notes (Signed)
1 Day Post-Op Procedure(s) (LRB): TRANSCATHETER AORTIC VALVE REPLACEMENT, TRANSFEMORAL (N/A) TRANSESOPHAGEAL ECHOCARDIOGRAM (TEE) (N/A) Subjective: Feels well. Says he ambulated without any shortness of breath and his cough is gone.  Objective: Vital signs in last 24 hours: Temp:  [96.2 F (35.7 C)-97.9 F (36.6 C)] 97.6 F (36.4 C) (10/07 0605) Pulse Rate:  [67-92] 92 (10/07 0605) Cardiac Rhythm: Normal sinus rhythm (10/07 0806) Resp:  [9-20] 20 (10/07 0605) BP: (123-155)/(65-89) 127/89 (10/07 0605) SpO2:  [95 %-99 %] 99 % (10/07 0605) Weight:  [89 kg] 89 kg (10/07 0605)  Hemodynamic parameters for last 24 hours:    Intake/Output from previous day: 10/06 0701 - 10/07 0700 In: 1874.2 [I.V.:1674.2; IV Piggyback:200] Out: 2010 [Urine:2000; Blood:10] Intake/Output this shift: Total I/O In: 160 [P.O.:160] Out: -   General appearance: alert and cooperative Neurologic: intact Heart: regular rate and rhythm, S1, S2 normal, no murmur, click, rub or gallop Lungs: clear to auscultation bilaterally Extremities: edema mild in lower legs Wound: groin sites ok  Lab Results: Recent Labs    09/22/19 1211 09/23/19 0123  WBC  --  10.8*  HGB 11.6* 12.9*  HCT 34.0* 37.6*  PLT  --  99*   BMET:  Recent Labs    09/22/19 1211 09/23/19 0123  NA 142 137  K 3.8 4.2  CL 109 111  CO2  --  21*  GLUCOSE 198* 220*  BUN 17 17  CREATININE 0.90 1.23  CALCIUM  --  8.1*    PT/INR: No results for input(s): LABPROT, INR in the last 72 hours. ABG    Component Value Date/Time   PHART 7.281 (L) 09/22/2019 1136   HCO3 21.5 09/22/2019 1136   TCO2 21 (L) 09/22/2019 1211   ACIDBASEDEF 5.0 (H) 09/22/2019 1136   O2SAT 97.0 09/22/2019 1136   CBG (last 3)  Recent Labs    09/22/19 1546 09/22/19 2134 09/23/19 0636  GLUCAP 202* 266* 176*   ECG: sinus, no acute changes  Assessment/Plan: S/P Procedure(s) (LRB): TRANSCATHETER AORTIC VALVE REPLACEMENT, TRANSFEMORAL (N/A) TRANSESOPHAGEAL  ECHOCARDIOGRAM (TEE) (N/A)  POD 1 Hemodynamically stable in sinus rhythm with no signs of AV block on monitor.  2D echo reviewed. The valve looks good with mean gradient of 5 mm Hg, no paravalvular leak.  Lower extremity edema related to preop heart failure and prior knee surgery. Continue Lasix as preop.  Plan home today. Resume Eliquis and ASA 81 mg. Resume Toprol.   LOS: 1 day    Gaye Pollack 09/23/2019

## 2019-09-24 ENCOUNTER — Telehealth: Payer: Self-pay

## 2019-09-24 NOTE — Telephone Encounter (Signed)
Patient's daughter [name] contacted regarding discharge from [facility] on 09/23/2019.  Patient understands to follow up with provider Nell Range PA-C on 10/01/2019 at 1:00 PM at [facility] office. Patient understands discharge instructions? yes Patient understands medications and regiment? yes Patient understands to bring all medications to this visit? Yes  Per [name] the pt is doing well after surgery.  No questions or concerns at this time.

## 2019-09-29 NOTE — Progress Notes (Signed)
[facility] [place]                                       Cardiology Office Note    Date:  10/01/2019   ID:  Jacob Simpson, DOB 01/16/1930, MRN [id number]  PCP:  Jacob Castle, MD  Cardiologist:  Dr. Fletcher Anon / Dr. Burt Knack & Dr. Cyndia Bent (TAVR)  CC: [facility] s/p TAVR   History of Present Illness:  Jacob Simpson is a 85 y.o. male with a history of B cell lymphoma s/p chemo, recurrent PNA, carotid artery disease (40-59% RICA occl), chronic diastolic CHF, PAF on Eliquis, HTN, TIA, radiculopathy requiring spinal injections, moderate MR and severe AS s/p TAVR (09/22/19) who presents to clinic for follow up.   The patient has a history of aortic stenosis followed by Dr. Fletcher Anon. His most recent echocardiogram on 07/06/2019 showed a mean gradient across the aortic valve of 58 mmHg with a peak gradient of 72 mmHg.Aortic valve area was 0.63 cm. Left ventricular systolic function was normal at 55 to 60%. There is degenerative mitral valve disease with moderate mitral regurgitation. Cardiac catheterization on 08/10/2019 showed minimal nonobstructive coronary disease. The aortic valve was not crossed.Right heart catheterization did show severe pulmonary hypertension with a PA pressure of 74/30 with a mean of 50. Cardiac index was 2.09. Pulmonary wedge pressure was 33 mm with prominent V waves suggesting mitral regurgitation. He has had progressive dyspnea, cough and bendopnea.  The patient has been evaluated by the multidisciplinary valve team and underwent successful TAVR with a 29 mm Edwards Sapien 3 THV via the TF approach on 09/22/19. Post operative echo showed EF 55%, normally functioning TAVR with mean gradient 5.3 mm Hg and no PVL. He was resumed on home Eliquis and a baby aspirin was added.   Today he presents to clinic for follow up. His daughter, Jacob Simpson, is presents. He is doing much better since TAVR. He walked all the way into our office from the car  with no shortness of breath which is a big improvement. Chronic cough has also improved since TAVR. Chronic LE edema that is stable. No orthopnea or PND. No dizziness of syncope. No blood in his stool or urine.    Past Medical History:  Diagnosis Date  . Aortic aneurysm ([place])   . Asthma   . Carotid artery disease ([place])   . Chronic diastolic CHF (congestive heart failure) ([place])   . Diabetes mellitus ([place])   . Hammertoe   . History of TIA (transient ischemic attack)   . HTN (hypertension)   . Lymphoma ([place])   . PAF (paroxysmal atrial fibrillation) ([place])   . Pneumonia    recurrent PNA  . Radiculitis, lumbosacral    s/p spinal injections  . 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 Burt Knack and Dr. Cyndia Bent  . Severe aortic stenosis     Past Surgical History:  Procedure Laterality Date  . BACK SURGERY  12/89  . CATARACT EXTRACTION W/ INTRAOCULAR LENS IMPLANT Right 12/00  . CATARACT EXTRACTION W/ INTRAOCULAR LENS IMPLANT Left 8/01  . foot sugery Right 6/03  . FOOT SURGERY Left 1/96   hammertoe  . KIDNEY STONE SURGERY  7/90  . RIGHT/LEFT HEART CATH AND CORONARY ANGIOGRAPHY N/A 08/10/2019  Procedure: RIGHT/LEFT HEART CATH AND CORONARY ANGIOGRAPHY;  Surgeon: Wellington Hampshire, MD;  Location: [facility];  Service: Cardiovascular;  Laterality: N/A;  . ROTATOR CUFF REPAIR Right 1/98  . TEE WITHOUT CARDIOVERSION N/A 09/22/2019   Procedure: TRANSESOPHAGEAL ECHOCARDIOGRAM (TEE);  Surgeon: Sherren Mocha, MD;  Location: [facility];  Service: Open Heart Surgery;  Laterality: N/A;  . TOTAL KNEE ARTHROPLASTY Bilateral 1987  . TRANSCATHETER AORTIC VALVE REPLACEMENT, TRANSFEMORAL N/A 09/22/2019   Procedure: TRANSCATHETER AORTIC VALVE REPLACEMENT, TRANSFEMORAL;  Surgeon: Sherren Mocha, MD;  Location: [facility] [facility];  Service: Open Heart Surgery;  Laterality: N/A;    Current  Medications: Outpatient Medications Prior to Visit  Medication Sig Dispense Refill  . acetaminophen (TYLENOL) 500 MG tablet Take 1,000 mg by mouth every 6 (six) hours as needed for mild pain, moderate pain or fever.    Marland Kitchen apixaban (ELIQUIS) 2.5 MG TABS tablet Take 1 tablet (2.5 mg total) by mouth 2 (two) times daily.    Marland Kitchen aspirin 81 MG chewable tablet Chew 1 tablet (81 mg total) by mouth daily.    Marland Kitchen atorvastatin (LIPITOR) 40 MG tablet Take 40 mg by mouth daily.    Marland Kitchen azelastine (ASTELIN) 0.1 % nasal spray Place 1 spray into both nostrils 2 (two) times daily as needed for rhinitis or allergies. Use in each nostril as directed    . cetirizine (ZYRTEC) 10 MG tablet Take 10 mg by mouth daily.     . famotidine (PEPCID) 10 MG tablet Take 10 mg by mouth daily as needed for heartburn or indigestion.    . fluticasone (FLOVENT HFA) 110 MCG/ACT inhaler Inhale 1 puff into the lungs 2 (two) times daily as needed (respiratory symptoms).     . furosemide (LASIX) 40 MG tablet Take 0.5 tablets (20 mg total) by mouth daily.    Marland Kitchen gabapentin (NEURONTIN) 100 MG capsule Take 1 capsule (100 mg total) by mouth 3 (three) times daily as needed (back pain). (Patient taking differently: Take 300 mg by mouth 3 (three) times daily. ) 30 capsule 0  . guaiFENesin (MUCINEX) 600 MG 12 hr tablet Take 600 mg by mouth 2 (two) times daily as needed for cough or to loosen phlegm.    . Ipratropium-Albuterol (COMBIVENT) 20-100 MCG/ACT AERS respimat Inhale 1 puff into the lungs every 6 (six) hours. (Patient taking differently: Inhale 1 puff into the lungs every 6 (six) hours as needed for wheezing or shortness of breath. ) 4 g 0  . lactulose (CHRONULAC) 10 GM/15ML solution Take 20 g by mouth daily as needed for moderate constipation.     . meclizine (ANTIVERT) 12.5 MG tablet Take 12.5 mg by mouth 3 (three) times daily as needed for dizziness.    . metoprolol succinate (TOPROL-XL) 50 MG 24 hr tablet Take 1 tablet (50 mg total) by mouth daily.  Take with or immediately following a meal. 30 tablet 0  . mirtazapine (REMERON) 7.5 MG tablet Take 7.5 mg by mouth at bedtime.    Marland Kitchen omeprazole (PRILOSEC) 20 MG capsule Take 20 mg by mouth daily as needed (stomach acid).    . ondansetron (ZOFRAN-ODT) 8 MG disintegrating tablet Take 8 mg by mouth every 8 (eight) hours as needed for nausea or vomiting.    . polyethylene glycol (MIRALAX) 17 g packet Take 17 g by mouth daily as needed for moderate constipation. 14 each 0  . tamsulosin (FLOMAX) 0.4 MG CAPS capsule Take 1 capsule (0.4 mg total) by mouth daily after  supper. 90 capsule 3   No facility-administered medications prior to visit.      Allergies:   Penicillins   Social History   Socioeconomic History  . Marital status: Widowed    Spouse name: Not on file  . Number of children: 5  . Years of education: Not on file  . Highest education level: Not on file  Occupational History  . Occupation: Retired   Scientific laboratory technician  . Financial resource strain: Not hard at all  . Food insecurity    Worry: Never true    Inability: Never true  . Transportation needs    Medical: No    Non-medical: No  Tobacco Use  . Smoking status: Former Research scientist (life sciences)  . Smokeless tobacco: Never Used  . Tobacco comment: quit 1989  Substance and Sexual Activity  . Alcohol use: No  . Drug use: No  . Sexual activity: Not on file  Lifestyle  . Physical activity    Days per week: Not on file    Minutes per session: Not on file  . Stress: Only a little  Relationships  . Social connections    Talks on phone: More than three times a week    Gets together: Not on file    Attends religious service: Not on file    Active member of club or organization: Not on file    Attends meetings of clubs or organizations: Not on file    Relationship status: Not on file  Other Topics Concern  . Not on file  Social History Narrative  . Not on file     Family History:  The patient's family history includes Heart disease in his  father.     ROS:   Please see the history of present illness.    ROS All other systems reviewed and are negative.   PHYSICAL EXAM:   VS:  BP 132/60   Pulse 80   Ht 5\' 10"  (1.778 m)   Wt 197 lb (89.4 kg)   SpO2 98%   BMI 28.27 kg/m    GEN: Well nourished, well developed, in no acute distress HEENT: normal Neck: no JVD or masses Cardiac: RRR; soft flow murmur . No rubs or gallops. Bilateral 2+ pitting LE edema, L>R Respiratory:  clear to auscultation bilaterally, normal work of breathing GI: soft, nontender, nondistended, + BS MS: no deformity or atrophy Skin: warm and dry, no rash.  Groin sites look good with healing ecchymosis down right thigh and over pubic bone.  Neuro:  Alert and Oriented x 3, Strength and sensation are intact Psych: euthymic mood, full affect   Wt Readings from Last 3 Encounters:  10/01/19 197 lb (89.4 kg)  09/23/19 196 lb 3.4 oz (89 kg)  09/18/19 197 lb 11.2 oz (89.7 kg)      Studies/Labs Reviewed:   EKG:  EKG is ordered today.  The ekg ordered today demonstrates NSR HR 80 with LAD and LBBB (QRS 163ms)  Recent Labs: 09/18/2019: ALT 22; B Natriuretic Peptide 461.5 09/23/2019: BUN 17; Creatinine, Ser 1.23; Hemoglobin 12.9; Magnesium 2.0; Platelets 99; Potassium 4.2; Sodium 137   Lipid Panel No results found for: CHOL, TRIG, HDL, CHOLHDL, VLDL, LDLCALC, LDLDIRECT  Additional studies/ records that were reviewed today include:  TAVR OPERATIVE NOTE Date of Procedure:09/22/2019  Preoperative Diagnosis:Severe Aortic Stenosis   Postoperative Diagnosis:Same   Procedure:   Transcatheter Aortic Valve Replacement - PercutaneousLeftTransfemoral Approach Edwards Sapien 3 THV (size 50mm, model # 9600TFX, serial [id number])  Co-Surgeons:Bryan K. Cyndia Bent,  MD and Sherren Mocha, MD   Anesthesiologist:K. Smith Robert, MD  Echocardiographer:M.  Croitoru, MD  Pre-operative Echo Findings: ? Severe aortic stenosis ? Normalleft ventricular systolic function  Post-operative Echo Findings: ? Noparavalvular leak ? Normalleft ventricular systolic function   _____________   Echo 09/23/19: IMPRESSIONS 1. Left ventricular ejection fraction, by visual estimation, is 55 to 60%. The left ventricle has normal function. Left ventricular septal wall thickness was moderately increased. Normal left ventricular posterior wall thickness. There is moderately  increased left ventricular hypertrophy. 2. Global right ventricle has normal systolic function.The right ventricular size is normal. No increase in right ventricular wall thickness. 3. Left atrial size was elongated. 4. Right atrial size was normal. 5. The pericardial effusion is posterior to the left ventricle. 6. Trivial pericardial effusion is present. 7. Moderate calcification of the mitral valve leaflet(s). 8. Moderate mitral annular calcification. 9. Moderate thickening of the mitral valve leaflet(s). 10. The mitral valve is degenerative. Moderate mitral valve regurgitation. 11. The tricuspid valve is normal in structure. Tricuspid valve regurgitation is mild. 12. Aortic valve regurgitation was not visualized by color flow Doppler. 13. Post TAVR with 29 mm Sapien 3 Ultra valve well positioned no PVL stable low gradients. 14. The pulmonic valve was grossly normal. Pulmonic valve regurgitation is mild by color flow Doppler. 15. Mildly elevated pulmonary artery systolic pressure.    ASSESSMENT & PLAN:   Severe AS s/p TAVR: he is dong quite well with a big improvement in his breathing and chronic cough since TAVR. Groin sites are healing well. ECG with no HAVB. Continue on aspirin and eliquis. SBE prophylaxis discussed; he has taken amoxicillin as Rx'd by his dentist for years. I will see him back in a few weeks for 1 month follow up and echo.    Chronic diastolic  CHF: appears euvolemic. Has chronic LE edema that is stable. Continue lasix 20mg  daily. Check BMET today   PAF: maintaining NSR. Resume home Eliquis 2.5mg  BID. Currently he qualifies for the higher dose based on age, weight and renal function. But given labile renal function and need for concomitant aspirin, I will leave as is.   Carotid artery disease: 40-59% RICA stenosis. Continue medical therapy.   HTN: BP well controlled today. Continue current regimen  DMT2: HgA1x elevated to 7.7. Had been on Metformin before but now diet controlled. Will start him on Metformin 500mg  BID and have him follow up with his PCP.    Medication Adjustments/Labs and Tests Ordered: Current medicines are reviewed at length with the patient today.  Concerns regarding medicines are outlined above.  Medication changes, Labs and Tests ordered today are listed in the Patient Instructions below. Patient Instructions  Medication Instructions:  1) START METFORMIN 500 mg twice daily. Please follow-up with your primary care physician for further refills.  Labwork: TODAY: BMET, CBC  Follow-Up: Please keep your follow-up appointments as scheduled!    Signed, Angelena Form, PA-C  10/01/2019 1:31 PM    [facility] [address], [place], [place]  [address] Phone: [phone]; Fax: [phone]

## 2019-09-30 ENCOUNTER — Ambulatory Visit: Payer: Medicare Other | Admitting: Physician Assistant

## 2019-10-01 ENCOUNTER — Ambulatory Visit (INDEPENDENT_AMBULATORY_CARE_PROVIDER_SITE_OTHER): Payer: Medicare Other | Admitting: Physician Assistant

## 2019-10-01 ENCOUNTER — Encounter: Payer: Self-pay | Admitting: Physician Assistant

## 2019-10-01 ENCOUNTER — Other Ambulatory Visit: Payer: Self-pay

## 2019-10-01 VITALS — BP 132/60 | HR 80 | Ht 70.0 in | Wt 197.0 lb

## 2019-10-01 DIAGNOSIS — I5032 Chronic diastolic (congestive) heart failure: Secondary | ICD-10-CM | POA: Diagnosis not present

## 2019-10-01 DIAGNOSIS — I480 Paroxysmal atrial fibrillation: Secondary | ICD-10-CM | POA: Diagnosis not present

## 2019-10-01 DIAGNOSIS — Z23 Encounter for immunization: Secondary | ICD-10-CM | POA: Diagnosis not present

## 2019-10-01 DIAGNOSIS — Z952 Presence of prosthetic heart valve: Secondary | ICD-10-CM

## 2019-10-01 DIAGNOSIS — I779 Disorder of arteries and arterioles, unspecified: Secondary | ICD-10-CM

## 2019-10-01 DIAGNOSIS — E118 Type 2 diabetes mellitus with unspecified complications: Secondary | ICD-10-CM

## 2019-10-01 DIAGNOSIS — I10 Essential (primary) hypertension: Secondary | ICD-10-CM

## 2019-10-01 MED ORDER — METFORMIN HCL 500 MG PO TABS: 500.0000 mg | ORAL_TABLET | Freq: Two times a day (BID) | ORAL | 0 refills | Status: DC

## 2019-10-01 NOTE — Patient Instructions (Signed)
Medication Instructions:  1) START METFORMIN 500 mg twice daily. Please follow-up with your primary care physician for further refills.  Labwork: TODAY: BMET, CBC  Follow-Up: Please keep your follow-up appointments as scheduled!

## 2019-10-02 LAB — CBC WITH DIFFERENTIAL/PLATELET
Basophils Absolute: 0.1 10*3/uL (ref 0.0–0.2)
Basos: 1 %
EOS (ABSOLUTE): 0.4 10*3/uL (ref 0.0–0.4)
Eos: 5 %
Hematocrit: 39.3 % (ref 37.5–51.0)
Hemoglobin: 13.5 g/dL (ref 13.0–17.7)
Immature Grans (Abs): 0 10*3/uL (ref 0.0–0.1)
Immature Granulocytes: 1 %
Lymphocytes Absolute: 1.7 10*3/uL (ref 0.7–3.1)
Lymphs: 19 %
MCH: 31.3 pg (ref 26.6–33.0)
MCHC: 34.4 g/dL (ref 31.5–35.7)
MCV: 91 fL (ref 79–97)
Monocytes Absolute: 0.6 10*3/uL (ref 0.1–0.9)
Monocytes: 7 %
Neutrophils Absolute: 6 10*3/uL (ref 1.4–7.0)
Neutrophils: 67 %
Platelets: 149 10*3/uL — ABNORMAL LOW (ref 150–450)
RBC: 4.32 x10E6/uL (ref 4.14–5.80)
RDW: 12.5 % (ref 11.6–15.4)
WBC: 8.9 10*3/uL (ref 3.4–10.8)

## 2019-10-02 LAB — BASIC METABOLIC PANEL
BUN/Creatinine Ratio: 12 (ref 10–24)
BUN: 15 mg/dL (ref 8–27)
CO2: 23 mmol/L (ref 20–29)
Calcium: 8.2 mg/dL — ABNORMAL LOW (ref 8.6–10.2)
Chloride: 103 mmol/L (ref 96–106)
Creatinine, Ser: 1.3 mg/dL — ABNORMAL HIGH (ref 0.76–1.27)
GFR calc Af Amer: 57 mL/min/{1.73_m2} — ABNORMAL LOW (ref >59)
GFR calc non Af Amer: 49 mL/min/{1.73_m2} — ABNORMAL LOW (ref >59)
Glucose: 275 mg/dL — ABNORMAL HIGH (ref 65–99)
Potassium: 4.2 mmol/L (ref 3.5–5.2)
Sodium: 141 mmol/L (ref 134–144)

## 2019-10-12 NOTE — Progress Notes (Signed)
Patient ID: Jacob Simpson, male    DOB: [birth year], 85 y.o.   MRN: [id number]  HPI  Jacob Simpson is a 85 y/o male with a history of HTN, severe aortic stenosis, TIA, previous tobacco use and chronic heart failure.   Echo report reviewed from 09/23/2019 and showed an EF of 55-60%. Echo report from 07/06/2019 reviewed and showed an EF of 55-60% along with moderate Jacob, mild/moderate TR, severe AS and an elevated PA pressure of 71.9 mmHg. Echo report from 02/25/2019 reviewed and showed an EF of 60-65% along with moderate Jacob and severe AS.   Admitted 09/22/2019 for planned TAVR procedure and discharged the next day. He had an outpatient heart cath 08/10/2019 which showed severely elevated filling pressure, severe pulmonary hypertension, and severe aortic stenosis. Admitted 07/05/2019 due to acute kidney injury secondary to ATN and hypotension. Cardiology consult obtained. Given gentle IV hydration. Discharged after 3 days.   He presents today for a follow-up visit with a chief complaint of productive cough, leg swelling, and right leg weakness. He denies fatigue, shortness of breath, chest pain, palpitations, abdominal distention, dizziness, and trouble sleeping. He had a TAVR 09/22/19 and since then his breathing has improved significantly as well as his palpitations. He has been weighing himself every day and his weight is stable. He has chronic swelling in both legs, L>R. He wears compression socks daily and elevates his legs at times. He has been doing some work around the house, although his mobility has been limited because of his right leg weakness. He states that a provider changed his lasix to 20mg  daily because of his kidney function. He states his sugar has been high recently and was started on metformin. He does not have a glucometer at home.   Past Medical History:  Diagnosis Date  . Aortic aneurysm ([place])   . Asthma   . Carotid artery disease ([place])   . Chronic diastolic CHF (congestive heart  failure) ([place])   . Diabetes mellitus ([place])   . Hammertoe   . History of TIA (transient ischemic attack)   . HTN (hypertension)   . Lymphoma ([place])   . PAF (paroxysmal atrial fibrillation) ([place])   . Pneumonia    recurrent PNA  . Radiculitis, lumbosacral    s/p spinal injections  . 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 Burt Knack and Dr. Cyndia Bent  . Severe aortic stenosis    Past Surgical History:  Procedure Laterality Date  . BACK SURGERY  12/89  . CATARACT EXTRACTION W/ INTRAOCULAR LENS IMPLANT Right 12/00  . CATARACT EXTRACTION W/ INTRAOCULAR LENS IMPLANT Left 8/01  . foot sugery Right 6/03  . FOOT SURGERY Left 1/96   hammertoe  . KIDNEY STONE SURGERY  7/90  . RIGHT/LEFT HEART CATH AND CORONARY ANGIOGRAPHY N/A 08/10/2019   Procedure: RIGHT/LEFT HEART CATH AND CORONARY ANGIOGRAPHY;  Surgeon: Wellington Hampshire, MD;  Location: [facility];  Service: Cardiovascular;  Laterality: N/A;  . ROTATOR CUFF REPAIR Right 1/98  . TEE WITHOUT CARDIOVERSION N/A 09/22/2019   Procedure: TRANSESOPHAGEAL ECHOCARDIOGRAM (TEE);  Surgeon: Sherren Mocha, MD;  Location: [facility];  Service: Open Heart Surgery;  Laterality: N/A;  . TOTAL KNEE ARTHROPLASTY Bilateral 1987  . TRANSCATHETER AORTIC VALVE REPLACEMENT, TRANSFEMORAL N/A 09/22/2019   Procedure: TRANSCATHETER AORTIC VALVE REPLACEMENT, TRANSFEMORAL;  Surgeon: Sherren Mocha, MD;  Location: [facility];  Service: Open  Heart Surgery;  Laterality: N/A;   Family History  Problem Relation Age of Onset  . Heart disease Father    Social History   Tobacco Use  . Smoking status: Former Research scientist (life sciences)  . Smokeless tobacco: Never Used  . Tobacco comment: quit 1989  Substance Use Topics  . Alcohol use: No   Allergies  Allergen Reactions  . Penicillins Rash    Did it involve swelling of the face/tongue/throat, SOB, or low BP? Unknown Did  it involve sudden or severe rash/hives, skin peeling, or any reaction on the inside of your mouth or nose? Unknown Did you need to seek medical attention at a hospital or doctor's office? Unknown When did it last happen?Unknown If all above answers are "NO", may proceed with cephalosporin use.    Prior to Admission medications   Medication Sig Start Date End Date Taking? Authorizing Provider  acetaminophen (TYLENOL) 500 MG tablet Take 1,000 mg by mouth every 6 (six) hours as needed for mild pain, moderate pain or fever.   Yes [provider]  apixaban (ELIQUIS) 2.5 MG TABS tablet Take 1 tablet (2.5 mg total) by mouth 2 (two) times daily. 06/25/19  Yes Wellington Hampshire, MD  aspirin 81 MG chewable tablet Chew 1 tablet (81 mg total) by mouth daily. 09/24/19  Yes Eileen Stanford, PA-C  atorvastatin (LIPITOR) 40 MG tablet Take 40 mg by mouth daily.   Yes [provider]  azelastine (ASTELIN) 0.1 % nasal spray Place 1 spray into both nostrils 2 (two) times daily as needed for rhinitis or allergies. Use in each nostril as directed   Yes [provider]  cetirizine (ZYRTEC) 10 MG tablet Take 10 mg by mouth daily.    Yes [provider]  famotidine (PEPCID) 10 MG tablet Take 10 mg by mouth daily as needed for heartburn or indigestion.   Yes [provider]  fluticasone (FLOVENT HFA) 110 MCG/ACT inhaler Inhale 1 puff into the lungs 2 (two) times daily as needed (respiratory symptoms).    Yes [provider]  furosemide (LASIX) 40 MG tablet Take 0.5 tablets (20 mg total) by mouth daily. 09/23/19 09/22/20 Yes Eileen Stanford, PA-C  gabapentin (NEURONTIN) 100 MG capsule Take 1 capsule (100 mg total) by mouth 3 (three) times daily as needed (back pain). Patient taking differently: Take 300 mg by mouth 3 (three) times daily.  07/07/19  Yes Epifanio Lesches, MD  guaiFENesin (MUCINEX) 600 MG 12 hr tablet Take 600 mg by mouth 2 (two) times daily as  needed for cough or to loosen phlegm.   Yes [provider]  Ipratropium-Albuterol (COMBIVENT) 20-100 MCG/ACT AERS respimat Inhale 1 puff into the lungs every 6 (six) hours. Patient taking differently: Inhale 1 puff into the lungs every 6 (six) hours as needed for wheezing or shortness of breath.  07/07/19  Yes Epifanio Lesches, MD  lactulose (CHRONULAC) 10 GM/15ML solution Take 20 g by mouth daily as needed for moderate constipation.  04/14/19  Yes [provider]  meclizine (ANTIVERT) 12.5 MG tablet Take 12.5 mg by mouth 3 (three) times daily as needed for dizziness.   Yes [provider]  metFORMIN (GLUCOPHAGE) 500 MG tablet Take 1 tablet (500 mg total) by mouth 2 (two) times daily with a meal. 10/01/19 12/30/19 Yes Eileen Stanford, PA-C  metoprolol succinate (TOPROL-XL) 50 MG 24 hr tablet Take 1 tablet (50 mg total) by mouth daily. Take with or immediately following a meal. 07/08/19  Yes Wieting, Richard,  MD  mirtazapine (REMERON) 7.5 MG tablet Take 7.5 mg by mouth at bedtime.   Yes [provider]  omeprazole (PRILOSEC) 20 MG capsule Take 20 mg by mouth daily as needed (stomach acid).   Yes [provider]  ondansetron (ZOFRAN-ODT) 8 MG disintegrating tablet Take 8 mg by mouth every 8 (eight) hours as needed for nausea or vomiting.   Yes [provider]  polyethylene glycol (MIRALAX) 17 g packet Take 17 g by mouth daily as needed for moderate constipation. 07/08/19  Yes Wieting, Richard, MD  tamsulosin (FLOMAX) 0.4 MG CAPS capsule Take 1 capsule (0.4 mg total) by mouth daily after supper. 08/04/19  Yes Billey Co, MD   Review of Systems  Constitutional: Negative for appetite change and fatigue.  HENT: Positive for hearing loss. Negative for congestion, rhinorrhea and sore throat.   Eyes: Negative.   Respiratory: Positive for cough (productive in the morning). Negative for shortness of breath.   Cardiovascular: Positive for leg  swelling. Negative for chest pain and palpitations.  Gastrointestinal: Negative for abdominal distention and abdominal pain.  Endocrine: Negative.   Genitourinary: Negative.   Musculoskeletal: Negative for arthralgias.  Skin: Negative.   Allergic/Immunologic: Negative.   Neurological: Positive for weakness (right leg). Negative for dizziness and light-headedness.  Hematological: Negative for adenopathy. Does not bruise/bleed easily.  Psychiatric/Behavioral: Negative for dysphoric mood and sleep disturbance (sleeping on 2 pillows). The patient is not nervous/anxious.    Vitals:   10/14/19 1110  BP: (!) 148/96  Pulse: 79  Resp: 16  SpO2: 100%   Filed Weights   10/14/19 1110  Weight: 197 lb (89.4 kg)   Lab Results  Component Value Date   CREATININE 1.30 (H) 10/01/2019   CREATININE 1.23 09/23/2019   CREATININE 0.90 09/22/2019    Physical Exam Vitals signs and nursing note reviewed.  Constitutional:      Appearance: Normal appearance.  HENT:     Head: Normocephalic and atraumatic.     Right Ear: Decreased hearing noted.     Left Ear: Decreased hearing noted.  Neck:     Musculoskeletal: Normal range of motion and neck supple.  Cardiovascular:     Rate and Rhythm: Normal rate and regular rhythm.     Heart sounds: Normal heart sounds.  Pulmonary:     Breath sounds: Rhonchi (lower lobes) present. No wheezing or rales.  Abdominal:     General: There is no distension.     Palpations: Abdomen is soft.  Musculoskeletal:        General: No tenderness.     Right lower leg: Edema (1+ pitting) present.     Left lower leg: Edema (2+ pitting) present.  Skin:    General: Skin is warm and dry.  Neurological:     General: No focal deficit present.     Mental Status: He is alert and oriented to person, place, and time.  Psychiatric:        Mood and Affect: Mood normal.        Thought Content: Thought content normal.     Assessment & Plan:  1: Chronic heart failure with  preserved ejection fraction- - NYHA class II - euvolemic today - weighing daily; reminded to call for an overnight weight gain of >2 pounds or a weekly weight gain of >5 pounds - weight up 7 pounds from last visit 2 months ago - continue lasix 20mg  daily. He may take an additional 20mg  daily for weight gain >2 pounds overnight  or 5 pounds in a week, increased leg swelling, and shortness of breath - not adding salt and the daytime caregiver doesn't cook with salt either  - BNP 02/25/2019 was 873.0 - Pro-BNP 03/09/2019 was elevated at 2403  2: HTN- - BP mildly elevated today - saw PCP (Behling) 07/21/2019 - BMP from 10/01/2019 reviewed and showed sodium 141, potassium 4.2, creatinine 1.3 and GFR 49  3: S/P TAVR- - saw cardiology Burt Knack) 07/27/2019 - had TAVR 09/22/2019 and followed up with CT surgery [facility] Silos) 10/15/2019  4: Diabetes- - recently diagnosed and waiting for glucometer - blood sugar checked today and was 130.    Patient did not bring medications nor a list. Each medication was verbally reviewed with the patient and was encouraged to bring the bottles to every visit to confirm accuracy of the list.   Return in 3 months or sooner for any questions/problems before then.

## 2019-10-14 ENCOUNTER — Other Ambulatory Visit: Payer: Self-pay

## 2019-10-14 ENCOUNTER — Ambulatory Visit: Payer: Medicare Other | Attending: Family | Admitting: Family

## 2019-10-14 ENCOUNTER — Encounter: Payer: Self-pay | Admitting: Family

## 2019-10-14 VITALS — BP 148/96 | HR 79 | Resp 16 | Ht 70.0 in | Wt 197.0 lb

## 2019-10-14 DIAGNOSIS — I10 Essential (primary) hypertension: Secondary | ICD-10-CM

## 2019-10-14 DIAGNOSIS — Z7982 Long term (current) use of aspirin: Secondary | ICD-10-CM | POA: Diagnosis not present

## 2019-10-14 DIAGNOSIS — Z9841 Cataract extraction status, right eye: Secondary | ICD-10-CM | POA: Diagnosis not present

## 2019-10-14 DIAGNOSIS — Z7901 Long term (current) use of anticoagulants: Secondary | ICD-10-CM | POA: Insufficient documentation

## 2019-10-14 DIAGNOSIS — I272 Pulmonary hypertension, unspecified: Secondary | ICD-10-CM | POA: Insufficient documentation

## 2019-10-14 DIAGNOSIS — Z880 Allergy status to penicillin: Secondary | ICD-10-CM | POA: Diagnosis not present

## 2019-10-14 DIAGNOSIS — Z7951 Long term (current) use of inhaled steroids: Secondary | ICD-10-CM | POA: Diagnosis not present

## 2019-10-14 DIAGNOSIS — I110 Hypertensive heart disease with heart failure: Secondary | ICD-10-CM | POA: Diagnosis not present

## 2019-10-14 DIAGNOSIS — I5032 Chronic diastolic (congestive) heart failure: Secondary | ICD-10-CM | POA: Insufficient documentation

## 2019-10-14 DIAGNOSIS — I480 Paroxysmal atrial fibrillation: Secondary | ICD-10-CM | POA: Diagnosis not present

## 2019-10-14 DIAGNOSIS — Z8673 Personal history of transient ischemic attack (TIA), and cerebral infarction without residual deficits: Secondary | ICD-10-CM | POA: Insufficient documentation

## 2019-10-14 DIAGNOSIS — Z8572 Personal history of non-Hodgkin lymphomas: Secondary | ICD-10-CM | POA: Diagnosis not present

## 2019-10-14 DIAGNOSIS — Z952 Presence of prosthetic heart valve: Secondary | ICD-10-CM | POA: Diagnosis not present

## 2019-10-14 DIAGNOSIS — E119 Type 2 diabetes mellitus without complications: Secondary | ICD-10-CM | POA: Diagnosis not present

## 2019-10-14 DIAGNOSIS — Z87891 Personal history of nicotine dependence: Secondary | ICD-10-CM | POA: Insufficient documentation

## 2019-10-14 DIAGNOSIS — E118 Type 2 diabetes mellitus with unspecified complications: Secondary | ICD-10-CM | POA: Diagnosis present

## 2019-10-14 DIAGNOSIS — Z96653 Presence of artificial knee joint, bilateral: Secondary | ICD-10-CM | POA: Diagnosis not present

## 2019-10-14 DIAGNOSIS — Z7984 Long term (current) use of oral hypoglycemic drugs: Secondary | ICD-10-CM | POA: Diagnosis not present

## 2019-10-14 DIAGNOSIS — Z961 Presence of intraocular lens: Secondary | ICD-10-CM | POA: Diagnosis not present

## 2019-10-14 DIAGNOSIS — Z9842 Cataract extraction status, left eye: Secondary | ICD-10-CM | POA: Insufficient documentation

## 2019-10-14 DIAGNOSIS — Z79899 Other long term (current) drug therapy: Secondary | ICD-10-CM | POA: Insufficient documentation

## 2019-10-14 DIAGNOSIS — Z8249 Family history of ischemic heart disease and other diseases of the circulatory system: Secondary | ICD-10-CM | POA: Diagnosis not present

## 2019-10-14 LAB — GLUCOSE, CAPILLARY: Glucose-Capillary: 130 mg/dL — ABNORMAL HIGH (ref 70–99)

## 2019-10-14 NOTE — Patient Instructions (Addendum)
Continue weighing daily and call for an overnight weight gain of >2 pounds or a weekly weight gain of >5 pounds.  You may take an extra dose of 20mg  lasix as neded for weight gain, shortness of breath, or leg swelling. Call us if you start needing to take the extra dose frequently.

## 2019-10-15 ENCOUNTER — Ambulatory Visit (INDEPENDENT_AMBULATORY_CARE_PROVIDER_SITE_OTHER): Payer: Medicare Other | Admitting: Physician Assistant

## 2019-10-15 ENCOUNTER — Encounter: Payer: Self-pay | Admitting: Physician Assistant

## 2019-10-15 ENCOUNTER — Ambulatory Visit (HOSPITAL_COMMUNITY): Payer: Medicare Other | Attending: Cardiovascular Disease

## 2019-10-15 VITALS — BP 142/74 | HR 84 | Ht 70.0 in | Wt 198.0 lb

## 2019-10-15 DIAGNOSIS — Z952 Presence of prosthetic heart valve: Secondary | ICD-10-CM | POA: Insufficient documentation

## 2019-10-15 NOTE — Patient Instructions (Addendum)
Medication Instructions:  1) you may STOP ASPIRIN March 22, 2020  Labwork: None  Follow-Up: You are scheduled with Dr. Fletcher Anon on January 15, 2020 at 1:40PM.  You will be called to arrange your 1 year TAVR echo and office visit.

## 2019-10-15 NOTE — Progress Notes (Signed)
[facility]                                       Cardiology Office Note    Date:  10/16/2019   ID:  Jacob Simpson, DOB [birth year], MRN [id number]  PCP:  Valera Castle, MD  Cardiologist:  Dr. Fletcher Anon / Dr. Burt Knack & Dr. Cyndia Bent (TAVR)  CC: 1 month s/p TAVR   History of Present Illness:  Jacob Simpson is a 85 y.o. male with a history of B cell lymphoma s/p chemo, recurrent PNA, carotid artery disease (40-59% RICA occl), chronic diastolic CHF, PAF on Eliquis, HTN, TIA, radiculopathy requiring spinal injections, moderate MR and severe AS s/p TAVR (09/22/19) who presents to clinic for follow up.   The patient has a history of aortic stenosis followed by Dr. Fletcher Anon. His most recent echocardiogram on 07/06/2019 showed a mean gradient across the aortic valve of 58 mmHg with a peak gradient of 72 mmHg.Aortic valve area was 0.63 cm. Left ventricular systolic function was normal at 55 to 60%. There is degenerative mitral valve disease with moderate mitral regurgitation. Cardiac catheterization on 08/10/2019 showed minimal nonobstructive coronary disease. The aortic valve was not crossed.Right heart catheterization did show severe pulmonary hypertension with a PA pressure of 74/30 with a mean of 50. Cardiac index was 2.09. Pulmonary wedge pressure was 33 mm with prominent V waves suggesting mitral regurgitation. He has had progressive dyspnea, cough and bendopnea.  The patient has been evaluated by the multidisciplinary valve team and underwent successful TAVR with a 29 mm Edwards Sapien 3 THV via the TF approach on 09/22/19. Post operative echo showed EF 55%, normally functioning TAVR with mean gradient 5.3 mm Hg and no PVL. He was resumed on home Eliquis and a baby aspirin was added.   Today he presents to clinic for follow up. His daughter, [name], is present today. Has less bendopnea. Getting around better. Has had return of cough. No fevers  or chills. Sometime productive of yellow sputum which is typical. Still has some fagitue and chronic LE edema. No chest pain.    Past Medical History:  Diagnosis Date  . Aortic aneurysm ([place])   . Asthma   . Carotid artery disease ([place])   . Chronic diastolic CHF (congestive heart failure) ([place])   . Diabetes mellitus ([place])   . Hammertoe   . History of TIA (transient ischemic attack)   . HTN (hypertension)   . Lymphoma ([place])   . PAF (paroxysmal atrial fibrillation) ([place])   . Pneumonia    recurrent PNA  . Radiculitis, lumbosacral    s/p spinal injections  . 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 Burt Knack and Dr. Cyndia Bent  . Severe aortic stenosis     Past Surgical History:  Procedure Laterality Date  . BACK SURGERY  12/89  . CATARACT EXTRACTION W/ INTRAOCULAR LENS IMPLANT Right 12/00  . CATARACT EXTRACTION W/ INTRAOCULAR LENS IMPLANT Left 8/01  . foot sugery Right 6/03  . FOOT SURGERY Left 1/96   hammertoe  . KIDNEY STONE SURGERY  7/90  . RIGHT/LEFT HEART CATH AND CORONARY ANGIOGRAPHY N/A 08/10/2019   Procedure: RIGHT/LEFT HEART CATH AND CORONARY ANGIOGRAPHY;  Surgeon: Wellington Hampshire, MD;  Location: [facility];  Service: Cardiovascular;  Laterality: N/A;  . ROTATOR CUFF REPAIR Right 1/98  . TEE WITHOUT CARDIOVERSION N/A 09/22/2019   Procedure: TRANSESOPHAGEAL ECHOCARDIOGRAM (TEE);  Surgeon: Sherren Mocha, MD;  Location: [facility];  Service: Open Heart Surgery;  Laterality: N/A;  . TOTAL KNEE ARTHROPLASTY Bilateral 1987  . TRANSCATHETER AORTIC VALVE REPLACEMENT, TRANSFEMORAL N/A 09/22/2019   Procedure: TRANSCATHETER AORTIC VALVE REPLACEMENT, TRANSFEMORAL;  Surgeon: Sherren Mocha, MD;  Location: [facility];  Service: Open Heart Surgery;  Laterality: N/A;    Current Medications: Outpatient Medications Prior to Visit  Medication Sig Dispense Refill  .  acetaminophen (TYLENOL) 500 MG tablet Take 1,000 mg by mouth every 6 (six) hours as needed for mild pain, moderate pain or fever.    Marland Kitchen apixaban (ELIQUIS) 2.5 MG TABS tablet Take 1 tablet (2.5 mg total) by mouth 2 (two) times daily.    Marland Kitchen aspirin 81 MG chewable tablet Chew 1 tablet (81 mg total) by mouth daily.    Marland Kitchen atorvastatin (LIPITOR) 40 MG tablet Take 40 mg by mouth daily.    Marland Kitchen azelastine (ASTELIN) 0.1 % nasal spray Place 1 spray into both nostrils 2 (two) times daily as needed for rhinitis or allergies. Use in each nostril as directed    . cetirizine (ZYRTEC) 10 MG tablet Take 10 mg by mouth daily.     . famotidine (PEPCID) 10 MG tablet Take 10 mg by mouth daily as needed for heartburn or indigestion.    . fluticasone (FLOVENT HFA) 110 MCG/ACT inhaler Inhale 1 puff into the lungs 2 (two) times daily as needed (respiratory symptoms).     . furosemide (LASIX) 40 MG tablet Take 0.5 tablets (20 mg total) by mouth daily.    Marland Kitchen gabapentin (NEURONTIN) 100 MG capsule Take 300 mg by mouth 3 (three) times daily.    Marland Kitchen guaiFENesin (MUCINEX) 600 MG 12 hr tablet Take 600 mg by mouth 2 (two) times daily as needed for cough or to loosen phlegm.    . Ipratropium-Albuterol (COMBIVENT) 20-100 MCG/ACT AERS respimat Inhale 1 puff into the lungs every 6 (six) hours. (Patient taking differently: Inhale 1 puff into the lungs every 6 (six) hours as needed for wheezing or shortness of breath. ) 4 g 0  . lactulose (CHRONULAC) 10 GM/15ML solution Take 20 g by mouth daily as needed for moderate constipation.     . meclizine (ANTIVERT) 12.5 MG tablet Take 12.5 mg by mouth 3 (three) times daily as needed for dizziness.    . metFORMIN (GLUCOPHAGE) 500 MG tablet Take 1 tablet (500 mg total) by mouth 2 (two) times daily with a meal. 180 tablet 0  . metoprolol succinate (TOPROL-XL) 50 MG 24 hr tablet Take 1 tablet (50 mg total) by mouth daily. Take with or immediately following a meal. 30 tablet 0  . mirtazapine (REMERON) 7.5 MG  tablet Take 7.5 mg by mouth at bedtime.    Marland Kitchen omeprazole (PRILOSEC) 20 MG capsule Take 20 mg by mouth daily as needed (stomach acid).    . ondansetron (ZOFRAN-ODT) 8 MG disintegrating tablet Take 8 mg by mouth every 8 (eight) hours as needed for nausea or vomiting.    . polyethylene glycol (MIRALAX) 17 g packet Take 17 g by mouth daily as needed for moderate constipation. 14 each 0  . tamsulosin (FLOMAX) 0.4 MG CAPS capsule Take 1 capsule (0.4 mg total) by mouth daily after supper. 90 capsule 3  . gabapentin (NEURONTIN) 100 MG capsule Take 1 capsule (100 mg  total) by mouth 3 (three) times daily as needed (back pain). (Patient taking differently: Take 300 mg by mouth 3 (three) times daily. ) 30 capsule 0   No facility-administered medications prior to visit.      Allergies:   Penicillins   Social History   Socioeconomic History  . Marital status: Widowed    Spouse name: Not on file  . Number of children: 5  . Years of education: Not on file  . Highest education level: Not on file  Occupational History  . Occupation: Retired   Scientific laboratory technician  . Financial resource strain: Not hard at all  . Food insecurity    Worry: Never true    Inability: Never true  . Transportation needs    Medical: No    Non-medical: No  Tobacco Use  . Smoking status: Former Research scientist (life sciences)  . Smokeless tobacco: Never Used  . Tobacco comment: quit 1989  Substance and Sexual Activity  . Alcohol use: No  . Drug use: No  . Sexual activity: Not on file  Lifestyle  . Physical activity    Days per week: 7 days    Minutes per session: 30 min  . Stress: Only a little  Relationships  . Social connections    Talks on phone: More than three times a week    Gets together: More than three times a week    Attends religious service: 1 to 4 times per year    Active member of club or organization: No    Attends meetings of clubs or organizations: Never    Relationship status: Widowed  Other Topics Concern  . Not on file   Social History Narrative  . Not on file     Family History:  The patient's family history includes Heart disease in his father.     ROS:   Please see the history of present illness.    ROS All other systems reviewed and are negative.   PHYSICAL EXAM:   VS:  BP (!) 142/74   Pulse 84   Ht 5\' 10"  (1.778 m)   Wt 198 lb (89.8 kg)   SpO2 96%   BMI 28.41 kg/m    GEN: Well nourished, well developed, in no acute distress HEENT: normal Neck: no JVD or masses Cardiac: RRR; soft flow murmur . No rubs or gallops. Bilateral 2+ pitting LE edema, L>R Respiratory:  clear to auscultation bilaterally, normal work of breathing GI: soft, nontender, nondistended, + BS MS: no deformity or atrophy Skin: warm and dry, no rash.   Neuro:  Alert and Oriented x 3, Strength and sensation are intact Psych: euthymic mood, full affect   Wt Readings from Last 3 Encounters:  10/15/19 198 lb (89.8 kg)  10/14/19 197 lb (89.4 kg)  10/01/19 197 lb (89.4 kg)      Studies/Labs Reviewed:   EKG:  EKG is NOT ordered today.   Recent Labs: 09/18/2019: ALT 22; B Natriuretic Peptide 461.5 09/23/2019: Magnesium 2.0 10/01/2019: BUN 15; Creatinine, Ser 1.30; Hemoglobin 13.5; Platelets 149; Potassium 4.2; Sodium 141   Lipid Panel No results found for: CHOL, TRIG, HDL, CHOLHDL, VLDL, LDLCALC, LDLDIRECT  Additional studies/ records that were reviewed today include:   TAVR OPERATIVE NOTE Date of Procedure:09/22/2019  Preoperative Diagnosis:Severe Aortic Stenosis   Postoperative Diagnosis:Same   Procedure:   Transcatheter Aortic Valve Replacement - PercutaneousLeftTransfemoral Approach Edwards Sapien 3 THV (size 21mm, model # 9600TFX, serial [id number])  Co-Surgeons:Bryan Alveria Apley, MD and Sherren Mocha, MD  Anesthesiologist:K. Smith Robert, MD  Echocardiographer:M. Croitoru, MD   Pre-operative Echo Findings: ? Severe aortic stenosis ? Normalleft ventricular systolic function  Post-operative Echo Findings: ? Noparavalvular leak ? Normalleft ventricular systolic function   _____________   Echo 09/23/19: IMPRESSIONS 1. Left ventricular ejection fraction, by visual estimation, is 55 to 60%. The left ventricle has normal function. Left ventricular septal wall thickness was moderately increased. Normal left ventricular posterior wall thickness. There is moderately  increased left ventricular hypertrophy. 2. Global right ventricle has normal systolic function.The right ventricular size is normal. No increase in right ventricular wall thickness. 3. Left atrial size was elongated. 4. Right atrial size was normal. 5. The pericardial effusion is posterior to the left ventricle. 6. Trivial pericardial effusion is present. 7. Moderate calcification of the mitral valve leaflet(s). 8. Moderate mitral annular calcification. 9. Moderate thickening of the mitral valve leaflet(s). 10. The mitral valve is degenerative. Moderate mitral valve regurgitation. 11. The tricuspid valve is normal in structure. Tricuspid valve regurgitation is mild. 12. Aortic valve regurgitation was not visualized by color flow Doppler. 13. Post TAVR with 29 mm Sapien 3 Ultra valve well positioned no PVL stable low gradients. 14. The pulmonic valve was grossly normal. Pulmonic valve regurgitation is mild by color flow Doppler. 15. Mildly elevated pulmonary artery systolic pressure.  _____________   Echo 10/15/19: IMPRESSIONS  1. Left ventricular ejection fraction, by visual estimation, is 50 to 55%. The left ventricle has normal function. There is no left ventricular hypertrophy.  2. Left ventricular diastolic parameters are consistent with Grade I diastolic dysfunction (impaired relaxation).  3. Global right ventricle has normal systolic function.The right ventricular size is normal.  No increase in right ventricular wall thickness.  4. Left atrial size was normal.  5. Right atrial size was moderately dilated.  6. The mitral valve is normal in structure. Mild mitral valve regurgitation. Mild mitral stenosis.  7. The tricuspid valve is normal in structure. Tricuspid valve regurgitation is trivial.  8. Aortic valve regurgitation is not visualized.  9. The pulmonic valve was grossly normal. Pulmonic valve regurgitation is not visualized. 10. Normal pulmonary artery systolic pressure. 11. The atrial septum is grossly normal.  Aortic Valve: The aortic valve has been repaired/replaced. Aortic valve regurgitation is not visualized. Aortic valve mean gradient measures 10.8 mmHg. Aortic valve peak gradient measures 20.0 mmHg. Aortic valve area, by VTI measures 2.16 cm. 53mm  Edwards Sapien bioprosthetic, stented aortic valve (TAVR) valve is present in the aortic position.  ASSESSMENT & PLAN:   Severe AS s/p TAVR: echo today shows EF 50-55%, normally functioning TAVR with mean gradient 10.8 mmHg and no PVL. Mild MR/MS. He has NYHA class II symptoms. SBE prophylaxis discussed; he has taken amoxicillin as Rx'd by his dentist for years. ASA can be discontinued after 6 months of therapy. He will continue on long term Eliquis 2.5mg  BID.  Chronic diastolic CHF: appears euvolemic. He has chronic LE edema that is stable. Continue on same dose of lasix  PAF: maintaining NSR. Continue on home Eliquis 2.5mg  BID. Currently he qualifies for the higher dose based on age, weight and renal function. But given labile renal function and need for concomitant aspirin, I will leave as is.   Carotid artery disease: 40-59% RICA stenosis. Continue medical therapy.   HTN: BP initially elevated but 130/80 on my personal recheck. No changes made.   DMT2: HgA1c elevated to 7.7. He was started on Metformin last visit.    Medication Adjustments/Labs and Tests Ordered: Current  medicines are reviewed at  length with the patient today.  Concerns regarding medicines are outlined above.  Medication changes, Labs and Tests ordered today are listed in the Patient Instructions below. Patient Instructions  Medication Instructions:  1) you may STOP ASPIRIN March 22, 2020  Labwork: None  Follow-Up: You are scheduled with Dr. Fletcher Anon on January 15, 2020 at 1:40PM.  You will be called to arrange your 1 year TAVR echo and office visit.    Signed, Angelena Form, PA-C  10/16/2019 1:25 PM    [facility] [address], [place], [place]  [address] Phone: [phone]; Fax: [phone]

## 2020-01-13 ENCOUNTER — Ambulatory Visit: Payer: Medicare Other | Admitting: Family

## 2020-01-14 ENCOUNTER — Ambulatory Visit (INDEPENDENT_AMBULATORY_CARE_PROVIDER_SITE_OTHER): Payer: Medicare Other | Admitting: Cardiovascular Disease

## 2020-01-14 ENCOUNTER — Encounter: Payer: Self-pay | Admitting: Cardiovascular Disease

## 2020-01-14 ENCOUNTER — Other Ambulatory Visit: Payer: Self-pay

## 2020-01-14 VITALS — BP 152/84 | HR 78 | Ht 70.0 in | Wt 186.0 lb

## 2020-01-14 DIAGNOSIS — I10 Essential (primary) hypertension: Secondary | ICD-10-CM | POA: Diagnosis not present

## 2020-01-14 DIAGNOSIS — Z952 Presence of prosthetic heart valve: Secondary | ICD-10-CM

## 2020-01-14 DIAGNOSIS — I480 Paroxysmal atrial fibrillation: Secondary | ICD-10-CM | POA: Diagnosis not present

## 2020-01-14 DIAGNOSIS — I5032 Chronic diastolic (congestive) heart failure: Secondary | ICD-10-CM | POA: Diagnosis not present

## 2020-01-14 NOTE — Patient Instructions (Signed)
Medication Instructions:  Your physician recommends that you continue on your current medications as directed. Please refer to the Current Medication list given to you today.   *If you need a refill on your cardiac medications before your next appointment, please call your pharmacy*  Lab Work: None ordered  If you have labs (blood work) drawn today and your tests are completely normal, you will receive your results only by: Marland Kitchen MyChart Message (if you have MyChart) OR . A paper copy in the mail If you have any lab test that is abnormal or we need to change your treatment, we will call you to review the results.  Testing/Procedures: None ordered   Follow-Up: At [facility], you and your health needs are our priority.  As part of our continuing mission to provide you with exceptional heart care, we have created designated Provider Care Teams.  These Care Teams include your primary Cardiologist (physician) and Advanced Practice Providers (APPs -  Physician Assistants and Nurse Practitioners) who all work together to provide you with the care you need, when you need it.  Your next appointment:   3 month(s)  The format for your next appointment:   In Person  Provider:    You may see Dr. Fletcher Anon or one of the following Advanced Practice Providers on your designated Care Team:    Murray Hodgkins, NP  Christell Faith, PA-C  Marrianne Mood, PA-C

## 2020-01-15 ENCOUNTER — Ambulatory Visit: Payer: Medicare Other | Admitting: Cardiovascular Disease

## 2020-01-19 ENCOUNTER — Ambulatory Visit: Payer: Medicare Other | Attending: Family | Admitting: Family

## 2020-01-19 ENCOUNTER — Other Ambulatory Visit: Payer: Self-pay

## 2020-01-19 ENCOUNTER — Encounter: Payer: Self-pay | Admitting: Family

## 2020-01-19 VITALS — BP 161/87 | HR 85 | Resp 16 | Ht 72.0 in | Wt 190.8 lb

## 2020-01-19 DIAGNOSIS — I6529 Occlusion and stenosis of unspecified carotid artery: Secondary | ICD-10-CM | POA: Insufficient documentation

## 2020-01-19 DIAGNOSIS — Z952 Presence of prosthetic heart valve: Secondary | ICD-10-CM | POA: Diagnosis not present

## 2020-01-19 DIAGNOSIS — E119 Type 2 diabetes mellitus without complications: Secondary | ICD-10-CM | POA: Diagnosis not present

## 2020-01-19 DIAGNOSIS — I719 Aortic aneurysm of unspecified site, without rupture: Secondary | ICD-10-CM | POA: Diagnosis not present

## 2020-01-19 DIAGNOSIS — Z87891 Personal history of nicotine dependence: Secondary | ICD-10-CM | POA: Diagnosis not present

## 2020-01-19 DIAGNOSIS — Z79899 Other long term (current) drug therapy: Secondary | ICD-10-CM | POA: Insufficient documentation

## 2020-01-19 DIAGNOSIS — Z7984 Long term (current) use of oral hypoglycemic drugs: Secondary | ICD-10-CM | POA: Insufficient documentation

## 2020-01-19 DIAGNOSIS — Z7901 Long term (current) use of anticoagulants: Secondary | ICD-10-CM | POA: Insufficient documentation

## 2020-01-19 DIAGNOSIS — Z8673 Personal history of transient ischemic attack (TIA), and cerebral infarction without residual deficits: Secondary | ICD-10-CM | POA: Insufficient documentation

## 2020-01-19 DIAGNOSIS — I5032 Chronic diastolic (congestive) heart failure: Secondary | ICD-10-CM | POA: Insufficient documentation

## 2020-01-19 DIAGNOSIS — Z880 Allergy status to penicillin: Secondary | ICD-10-CM | POA: Diagnosis not present

## 2020-01-19 DIAGNOSIS — J45909 Unspecified asthma, uncomplicated: Secondary | ICD-10-CM | POA: Diagnosis not present

## 2020-01-19 DIAGNOSIS — I110 Hypertensive heart disease with heart failure: Secondary | ICD-10-CM | POA: Diagnosis not present

## 2020-01-19 DIAGNOSIS — Z7982 Long term (current) use of aspirin: Secondary | ICD-10-CM | POA: Insufficient documentation

## 2020-01-19 DIAGNOSIS — I480 Paroxysmal atrial fibrillation: Secondary | ICD-10-CM | POA: Diagnosis not present

## 2020-01-19 DIAGNOSIS — I10 Essential (primary) hypertension: Secondary | ICD-10-CM

## 2020-01-19 NOTE — Progress Notes (Signed)
Patient ID: RAZA FRISTOE, male    DOB: February 04, 1930, 86 y.o.   MRN: [id number]  HPI  Mr Munden is a 86 y/o male with a history of HTN, severe aortic stenosis, TIA, previous tobacco use and chronic heart failure.   Echo report from 10/15/2019 reviewed and showed an EF of 50-55% along with mild MR/MS and trivial TR. Echo report reviewed from 09/23/2019 and showed an EF of 55-60%. Echo report from 07/06/2019 reviewed and showed an EF of 55-60% along with moderate MR, mild/moderate TR, severe AS and an elevated PA pressure of 71.9 mmHg. Echo report from 02/25/2019 reviewed and showed an EF of 60-65% along with moderate MR and severe AS.   Admitted 09/22/2019 for planned TAVR procedure and discharged the next day. He had an outpatient heart cath 08/10/2019 which showed severely elevated filling pressure, severe pulmonary hypertension, and severe aortic stenosis.   He presents today for a follow-up visit with a chief complaint of minimal shortness of breath upon moderate exertion. He describes this as chronic in nature having been present for several years although is much better since he had his TAVR October 2020. He has associated decreased appetite, cough, pedal edema and leg weakness along with this. He denies any difficulty sleeping, dizziness, abdominal distention, palpitations, chest pain, fatigue or weight gain.   Says that he eats because he "has to" and not because he has an appetite.   Past Medical History:  Diagnosis Date  . Aortic aneurysm ([place])   . Asthma   . Carotid artery disease ([place])   . Chronic diastolic CHF (congestive heart failure) ([place])   . Diabetes mellitus ([place])   . Hammertoe   . History of TIA (transient ischemic attack)   . HTN (hypertension)   . Lymphoma ([place])   . PAF (paroxysmal atrial fibrillation) ([place])   . Pneumonia    recurrent PNA  . Radiculitis, lumbosacral    s/p spinal injections  . 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 Burt Knack and Dr. Cyndia Bent  . Severe aortic stenosis    Past Surgical History:  Procedure Laterality Date  . BACK SURGERY  12/89  . CATARACT EXTRACTION W/ INTRAOCULAR LENS IMPLANT Right 12/00  . CATARACT EXTRACTION W/ INTRAOCULAR LENS IMPLANT Left 8/01  . foot sugery Right 6/03  . FOOT SURGERY Left 1/96   hammertoe  . KIDNEY STONE SURGERY  7/90  . RIGHT/LEFT HEART CATH AND CORONARY ANGIOGRAPHY N/A 08/10/2019   Procedure: RIGHT/LEFT HEART CATH AND CORONARY ANGIOGRAPHY;  Surgeon: Wellington Hampshire, MD;  Location: [facility];  Service: Cardiovascular;  Laterality: N/A;  . ROTATOR CUFF REPAIR Right 1/98  . TEE WITHOUT CARDIOVERSION N/A 09/22/2019   Procedure: TRANSESOPHAGEAL ECHOCARDIOGRAM (TEE);  Surgeon: Sherren Mocha, MD;  Location: [facility];  Service: Open Heart Surgery;  Laterality: N/A;  . TOTAL KNEE ARTHROPLASTY Bilateral 1987  . TRANSCATHETER AORTIC VALVE REPLACEMENT, TRANSFEMORAL N/A 09/22/2019   Procedure: TRANSCATHETER AORTIC VALVE REPLACEMENT, TRANSFEMORAL;  Surgeon: Sherren Mocha, MD;  Location: [facility];  Service: Open Heart Surgery;  Laterality: N/A;   Family History  Problem Relation Age of Onset  . Heart disease Father    Social History   Tobacco Use  . Smoking status: Former Research scientist (life sciences)  . Smokeless tobacco: Never Used  . Tobacco comment: quit 1989  Substance Use Topics  . Alcohol use: No  Allergies  Allergen Reactions  . Penicillins Rash    Did it involve swelling of the face/tongue/throat, SOB, or low BP? Unknown Did it involve sudden or severe rash/hives, skin peeling, or any reaction on the inside of your mouth or nose? Unknown Did you need to seek medical attention at a hospital or doctor's office? Unknown When did it last happen?Unknown If all above answers are "NO", may proceed with cephalosporin use.    Prior to Admission medications   Medication Sig Start Date  End Date Taking? Authorizing Provider  acetaminophen (TYLENOL) 500 MG tablet Take 1,000 mg by mouth every 6 (six) hours as needed for mild pain, moderate pain or fever.   Yes [provider]  apixaban (ELIQUIS) 2.5 MG TABS tablet Take 1 tablet (2.5 mg total) by mouth 2 (two) times daily. 06/25/19  Yes Wellington Hampshire, MD  aspirin 81 MG chewable tablet Chew 1 tablet (81 mg total) by mouth daily. 09/24/19  Yes Eileen Stanford, PA-C  atorvastatin (LIPITOR) 40 MG tablet Take 40 mg by mouth daily.   Yes [provider]  azelastine (ASTELIN) 0.1 % nasal spray Place 1 spray into both nostrils 2 (two) times daily as needed for rhinitis or allergies. Use in each nostril as directed   Yes [provider]  cetirizine (ZYRTEC) 10 MG tablet Take 10 mg by mouth daily as needed for allergies.    Yes [provider]  famotidine (PEPCID) 10 MG tablet Take 10 mg by mouth daily as needed for heartburn or indigestion.   Yes [provider]  fluticasone (FLOVENT HFA) 110 MCG/ACT inhaler Inhale 1 puff into the lungs 2 (two) times daily as needed (respiratory symptoms).    Yes [provider]  furosemide (LASIX) 20 MG tablet Take 10 mg by mouth daily.   Yes [provider]  guaiFENesin (MUCINEX) 600 MG 12 hr tablet Take 600 mg by mouth 2 (two) times daily as needed for cough or to loosen phlegm.   Yes [provider]  Ipratropium-Albuterol (COMBIVENT) 20-100 MCG/ACT AERS respimat Inhale 1 puff into the lungs every 6 (six) hours. Patient taking differently: Inhale 1 puff into the lungs every 6 (six) hours as needed for wheezing or shortness of breath.  07/07/19  Yes Epifanio Lesches, MD  lactulose (CHRONULAC) 10 GM/15ML solution Take 20 g by mouth daily as needed for moderate constipation.  04/14/19  Yes [provider]  meclizine (ANTIVERT) 12.5 MG tablet Take 12.5 mg by mouth 3 (three) times daily as needed for dizziness.   Yes [provider]  metFORMIN (GLUCOPHAGE) 500 MG tablet Take 1 tablet (500 mg total) by mouth 2 (two) times daily with a meal. 10/01/19 01/19/20 Yes Eileen Stanford, PA-C  metoprolol succinate (TOPROL-XL) 50 MG 24 hr tablet Take 1 tablet (50 mg total) by mouth daily. Take with or immediately following a meal. 07/08/19  Yes Wieting, Richard, MD  mirtazapine (REMERON) 7.5 MG tablet Take 7.5 mg by mouth at bedtime.   Yes [provider]  omeprazole (PRILOSEC) 20 MG capsule Take 20 mg by mouth daily as needed (stomach acid).   Yes [provider]  ondansetron (ZOFRAN-ODT) 8 MG disintegrating tablet Take 8 mg by mouth every 8 (eight) hours as needed for nausea or vomiting.   Yes [provider]  polyethylene glycol (MIRALAX) 17 g packet Take 17 g by mouth daily as needed for moderate constipation. 07/08/19  Yes Loletha Grayer, MD  tamsulosin (FLOMAX) 0.4 MG  CAPS capsule Take 1 capsule (0.4 mg total) by mouth daily after supper. 08/04/19  Yes Billey Co, MD    Review of Systems  Constitutional: Positive for appetite change (decreased). Negative for fatigue.  HENT: Positive for hearing loss. Negative for congestion, rhinorrhea and sore throat.   Eyes: Negative.   Respiratory: Positive for cough (productive in the morning) and shortness of breath.   Cardiovascular: Positive for leg swelling. Negative for chest pain and palpitations.  Gastrointestinal: Negative for abdominal distention and abdominal pain.       Heartburn  Endocrine: Negative.   Genitourinary: Negative.   Musculoskeletal: Negative for arthralgias and back pain.  Skin: Negative.   Allergic/Immunologic: Negative.   Neurological: Positive for weakness (right leg). Negative for dizziness and light-headedness.  Hematological: Negative for adenopathy. Does not bruise/bleed easily.  Psychiatric/Behavioral: Negative for dysphoric mood and sleep disturbance (sleeping on 2 pillows). The patient is not  nervous/anxious.    Vitals:   01/19/20 1108  BP: (!) 161/87  Pulse: 85  Resp: 16  SpO2: 97%  Weight: 190 lb 12.8 oz (86.5 kg)  Height: 6' (1.829 m)   Wt Readings from Last 3 Encounters:  01/19/20 190 lb 12.8 oz (86.5 kg)  01/14/20 186 lb (84.4 kg)  10/15/19 198 lb (89.8 kg)   Lab Results  Component Value Date   CREATININE 1.30 (H) 10/01/2019   CREATININE 1.23 09/23/2019   CREATININE 0.90 09/22/2019    Physical Exam Vitals and nursing note reviewed.  Constitutional:      Appearance: Normal appearance.  HENT:     Head: Normocephalic and atraumatic.     Right Ear: Decreased hearing noted.     Left Ear: Decreased hearing noted.  Cardiovascular:     Rate and Rhythm: Normal rate and regular rhythm.     Heart sounds: Normal heart sounds.  Pulmonary:     Breath sounds: No wheezing, rhonchi or rales.  Abdominal:     General: There is no distension.     Palpations: Abdomen is soft.  Musculoskeletal:        General: No tenderness.     Cervical back: Normal range of motion and neck supple.     Right lower leg: Edema (1+ pitting) present.     Left lower leg: Edema (1+ pitting) present.  Skin:    General: Skin is warm and dry.  Neurological:     General: No focal deficit present.     Mental Status: He is alert and oriented to person, place, and time.  Psychiatric:        Mood and Affect: Mood normal.        Thought Content: Thought content normal.     Assessment & Plan:  1: Chronic heart failure with preserved ejection fraction- - NYHA class II - euvolemic today - weighing daily; reminded to call for an overnight weight gain of >2 pounds or a weekly weight gain of >5 pounds - weight down 7 pounds from last visit 3 months ago - due to decreased appetite, decreased weight will stop furosemide and use it as needed for above weight gain or worsening swelling in his legs - not adding salt and the daytime caregiver doesn't cook with salt either  - saw cardiology Fletcher Anon)  01/14/20 - BNP 02/25/2019 was 873.0 - Pro-BNP 03/09/2019 was elevated at 2403 - patient reports receiving his flu vaccine for this season  2: HTN- - BP mildly elevated today - saw PCP (Behling)01/01/20 - BMP from 10/01/2019 reviewed  and showed sodium 141, potassium 4.2, creatinine 1.3 and GFR 49  3: S/P TAVR- - had TAVR 09/22/2019 and followed up with CT surgery [facility]) 10/15/2019    Patient did not bring medications nor a list. Each medication was verbally reviewed with the patient and was encouraged to bring the bottles to every visit to confirm accuracy of the list.    Due to stable HF, will not make a return appointment for patient at this time. Advised patient and his daughter that they could call back at anytime to schedule another appointment and they were comfortable with this plan.

## 2020-01-19 NOTE — Patient Instructions (Addendum)
Continue weighing daily and call for an overnight weight gain of > 2 pounds or a weekly weight gain of >5 pounds.  Stop the furosemide and take it as needed for above weight gain or worsening swelling  Call us in the future if you'd like to make another appointment

## 2020-01-19 NOTE — Progress Notes (Signed)
Cardiology Office Note   Date:  01/19/2020   ID:  Jacob Simpson, DOB 1930/07/19, MRN [id number]  PCP:  Barbaraann Boys, MD  Cardiologist:   [name], MD   Chief Complaint  Patient presents with  . office visit    3 month F/U-patient would like to discuss switching Eliquis to Xarelto. Meds verbally reviewed with patient.      History of Present Illness: Jacob Simpson is a 86 y.o. male who presents for a follow-up visit regarding severe aortic stenosis.   He has history of chronic diastolic heart failure, paroxysmal atrial fibrillation, aortic stenosis, essential hypertension, history of B-cell lymphoma in remission, chronic kidney disease, previous TIA, aortic aneurysm measuring 4.3 cm and previous tobacco use.    He was hospitalized at [facility] in March with acute on chronic diastolic heart failure.  Echocardiogram  showed an EF of 60 to 65% with moderate mitral regurgitation and severe aortic stenosis.  Mean gradient was 42 mmHg with valve area of 0.37. He underwent TAVR in October 2020 with excellent results.  Since then, he had significant improvement in functional capacity as well as improvement in shortness of breath.  He feels stronger every day although he does complain of poor appetite.   Past Medical History:  Diagnosis Date  . Aortic aneurysm ([place])   . Asthma   . Carotid artery disease ([place])   . Chronic diastolic CHF (congestive heart failure) ([place])   . Diabetes mellitus ([place])   . Hammertoe   . History of TIA (transient ischemic attack)   . HTN (hypertension)   . Lymphoma ([place])   . PAF (paroxysmal atrial fibrillation) ([place])   . Pneumonia    recurrent PNA  . Radiculitis, lumbosacral    s/p spinal injections  . 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 Burt Knack and Dr. Cyndia Bent  . Severe aortic stenosis     Past Surgical History:  Procedure Laterality Date  . BACK  SURGERY  12/89  . CATARACT EXTRACTION W/ INTRAOCULAR LENS IMPLANT Right 12/00  . CATARACT EXTRACTION W/ INTRAOCULAR LENS IMPLANT Left 8/01  . foot sugery Right 6/03  . FOOT SURGERY Left 1/96   hammertoe  . KIDNEY STONE SURGERY  7/90  . RIGHT/LEFT HEART CATH AND CORONARY ANGIOGRAPHY N/A 08/10/2019   Procedure: RIGHT/LEFT HEART CATH AND CORONARY ANGIOGRAPHY;  Surgeon: Wellington Hampshire, MD;  Location: [facility];  Service: Cardiovascular;  Laterality: N/A;  . ROTATOR CUFF REPAIR Right 1/98  . TEE WITHOUT CARDIOVERSION N/A 09/22/2019   Procedure: TRANSESOPHAGEAL ECHOCARDIOGRAM (TEE);  Surgeon: Sherren Mocha, MD;  Location: [facility];  Service: Open Heart Surgery;  Laterality: N/A;  . TOTAL KNEE ARTHROPLASTY Bilateral 1987  . TRANSCATHETER AORTIC VALVE REPLACEMENT, TRANSFEMORAL N/A 09/22/2019   Procedure: TRANSCATHETER AORTIC VALVE REPLACEMENT, TRANSFEMORAL;  Surgeon: Sherren Mocha, MD;  Location: [facility];  Service: Open Heart Surgery;  Laterality: N/A;     Current Outpatient Medications  Medication Sig Dispense Refill  . acetaminophen (TYLENOL) 500 MG tablet Take 1,000 mg by mouth every 6 (six) hours as needed for mild pain, moderate pain or fever.    Marland Kitchen apixaban (ELIQUIS) 2.5 MG TABS tablet Take 1 tablet (2.5 mg total) by mouth 2 (two) times daily.    Marland Kitchen aspirin 81 MG chewable tablet Chew 1 tablet (81 mg total) by mouth daily.    Marland Kitchen  atorvastatin (LIPITOR) 40 MG tablet Take 40 mg by mouth daily.    Marland Kitchen azelastine (ASTELIN) 0.1 % nasal spray Place 1 spray into both nostrils 2 (two) times daily as needed for rhinitis or allergies. Use in each nostril as directed    . cetirizine (ZYRTEC) 10 MG tablet Take 10 mg by mouth daily as needed for allergies.     . famotidine (PEPCID) 10 MG tablet Take 10 mg by mouth daily as needed for heartburn or indigestion.    . fluticasone (FLOVENT HFA) 110 MCG/ACT inhaler Inhale 1 puff into the lungs 2 (two) times daily as needed  (respiratory symptoms).     . furosemide (LASIX) 20 MG tablet Take 10 mg by mouth daily.    Marland Kitchen guaiFENesin (MUCINEX) 600 MG 12 hr tablet Take 600 mg by mouth 2 (two) times daily as needed for cough or to loosen phlegm.    . Ipratropium-Albuterol (COMBIVENT) 20-100 MCG/ACT AERS respimat Inhale 1 puff into the lungs every 6 (six) hours. (Patient taking differently: Inhale 1 puff into the lungs every 6 (six) hours as needed for wheezing or shortness of breath. ) 4 g 0  . lactulose (CHRONULAC) 10 GM/15ML solution Take 20 g by mouth daily as needed for moderate constipation.     . meclizine (ANTIVERT) 12.5 MG tablet Take 12.5 mg by mouth 3 (three) times daily as needed for dizziness.    . metoprolol succinate (TOPROL-XL) 50 MG 24 hr tablet Take 1 tablet (50 mg total) by mouth daily. Take with or immediately following a meal. 30 tablet 0  . mirtazapine (REMERON) 7.5 MG tablet Take 7.5 mg by mouth at bedtime.    Marland Kitchen omeprazole (PRILOSEC) 20 MG capsule Take 20 mg by mouth daily as needed (stomach acid).    . ondansetron (ZOFRAN-ODT) 8 MG disintegrating tablet Take 8 mg by mouth every 8 (eight) hours as needed for nausea or vomiting.    . polyethylene glycol (MIRALAX) 17 g packet Take 17 g by mouth daily as needed for moderate constipation. 14 each 0  . tamsulosin (FLOMAX) 0.4 MG CAPS capsule Take 1 capsule (0.4 mg total) by mouth daily after supper. 90 capsule 3  . metFORMIN (GLUCOPHAGE) 500 MG tablet Take 1 tablet (500 mg total) by mouth 2 (two) times daily with a meal. 180 tablet 0   No current facility-administered medications for this visit.    Allergies:   Penicillins    Social History:  The patient  reports that he has quit smoking. He has never used smokeless tobacco. He reports that he does not drink alcohol or use drugs.   Family History:  The patient's family history includes Heart disease in his father.    ROS:  Please see the history of present illness.   Otherwise, review of systems are  positive for none.   All other systems are reviewed and negative.    PHYSICAL EXAM: VS:  BP (!) 152/84 (BP Location: Left Arm, Patient Position: Sitting, Cuff Size: Normal)   Pulse 78   Ht 5\' 10"  (1.778 m)   Wt 186 lb (84.4 kg)   SpO2 96%   BMI 26.69 kg/m  , BMI Body mass index is 26.69 kg/m. GEN: Well nourished, well developed, in no acute distress  HEENT: normal  Neck: no JVD, carotid bruits, or masses Cardiac: RRR; no  rubs, or gallops.  No murmurs heard today.  He has trace bilateral leg edema Respiratory:  clear to auscultation bilaterally, normal work of breathing GI:  soft, nontender, nondistended, + BS MS: no deformity or atrophy  Skin: warm and dry, no rash Neuro:  Strength and sensation are intact Psych: euthymic mood, full affect   EKG:  EKG is  ordered today. EKG showed normal sinus rhythm,LVH with repolarization abnormalities and left axis deviation.   Recent Labs: 09/18/2019: ALT 22; B Natriuretic Peptide 461.5 09/23/2019: Magnesium 2.0 10/01/2019: BUN 15; Creatinine, Ser 1.30; Hemoglobin 13.5; Platelets 149; Potassium 4.2; Sodium 141    Lipid Panel No results found for: CHOL, TRIG, HDL, CHOLHDL, VLDL, LDLCALC, LDLDIRECT    Wt Readings from Last 3 Encounters:  01/19/20 190 lb 12.8 oz (86.5 kg)  01/14/20 186 lb (84.4 kg)  10/15/19 198 lb (89.8 kg)       No flowsheet data found.    ASSESSMENT AND PLAN:  1.  Status post TAVR for severe aortic stenosis: Significant improvement in symptoms.  Most recent echo in October showed normal ejection fraction at 50 to 55% with normal functioning TAVR prosthesis.  Currently New York Heart Association class II.  2.  chronic diastolic heart failure: He seems to be euvolemic on current dose of furosemide.  I made no changes.  His most recent creatinine was 1.7.  Peak creatinine was 2.  3.  Essential hypertension: Blood pressure is mildly elevated today.  Continue to monitor for now and consider adjustment of  antihypertensive medications as needed.  3.  Paroxysmal atrial fibrillation: His most recent creatinine has been around 1.5 and thus I elected to keep him on the 2.5 mg dose given creatinine level and age especially that he is also on aspirin.  Once aspirin is discontinued, we can consider increasing the dose of Eliquis back to 5 especially if creatinine goes below 1.5. I do not see the need to switch to Xarelto at this time.   Disposition:   FU with me in 3 months.  Signed,  [name], MD  01/19/2020 4:59 PM    [facility]

## 2020-03-24 ENCOUNTER — Ambulatory Visit (INDEPENDENT_AMBULATORY_CARE_PROVIDER_SITE_OTHER): Payer: Medicare Other | Admitting: Cardiovascular Disease

## 2020-03-24 ENCOUNTER — Encounter: Payer: Self-pay | Admitting: Cardiovascular Disease

## 2020-03-24 ENCOUNTER — Other Ambulatory Visit: Payer: Self-pay

## 2020-03-24 VITALS — BP 150/92 | HR 76 | Ht 71.0 in | Wt 191.1 lb

## 2020-03-24 DIAGNOSIS — I480 Paroxysmal atrial fibrillation: Secondary | ICD-10-CM | POA: Diagnosis not present

## 2020-03-24 DIAGNOSIS — I5032 Chronic diastolic (congestive) heart failure: Secondary | ICD-10-CM

## 2020-03-24 DIAGNOSIS — Z952 Presence of prosthetic heart valve: Secondary | ICD-10-CM

## 2020-03-24 MED ORDER — APIXABAN 5 MG PO TABS: 5.0000 mg | ORAL_TABLET | Freq: Two times a day (BID) | ORAL | Status: DC

## 2020-03-24 NOTE — Patient Instructions (Signed)
Medication Instructions:  Your physician has recommended you make the following change in your medication:   1) STOP Aspirin  2) INCREASE Eliquis to 5 mg twice daily.  *If you need a refill on your cardiac medications before your next appointment, please call your pharmacy*   Lab Work: None ordered If you have labs (blood work) drawn today and your tests are completely normal, you will receive your results only by: Marland Kitchen MyChart Message (if you have MyChart) OR . A paper copy in the mail If you have any lab test that is abnormal or we need to change your treatment, we will call you to review the results.   Testing/Procedures: None ordered   Follow-Up: At [facility], you and your health needs are our priority.  As part of our continuing mission to provide you with exceptional heart care, we have created designated Provider Care Teams.  These Care Teams include your primary Cardiologist (physician) and Advanced Practice Providers (APPs -  Physician Assistants and Nurse Practitioners) who all work together to provide you with the care you need, when you need it.  We recommend signing up for the patient portal called "MyChart".  Sign up information is provided on this After Visit Summary.  MyChart is used to connect with patients for Virtual Visits (Telemedicine).  Patients are able to view lab/test results, encounter notes, upcoming appointments, etc.  Non-urgent messages can be sent to your provider as well.   To learn more about what you can do with MyChart, go to [email or web address].    Your next appointment:   6 month(s)  The format for your next appointment:   In Person  Provider:    You may see  Dr. Fletcher Anon  or one of the following Advanced Practice Providers on your designated Care Team:    Murray Hodgkins, NP  Christell Faith, PA-C  Marrianne Mood, PA-C    Other Instructions N/A

## 2020-03-24 NOTE — Progress Notes (Signed)
Cardiology Office Note   Date:  03/24/2020   ID:  Jacob Simpson, DOB 1930/07/20, MRN [id number]  PCP:  Barbaraann Boys, MD  Cardiologist:   [name], MD   Chief Complaint  Patient presents with  . office visit    3 month F/U; Meds verbally reviewed with patient.      History of Present Illness: Jacob Simpson is a 86 y.o. male who presents for a follow-up visit regarding severe aortic stenosis status post TAVR in October 2020.   He has history of chronic diastolic heart failure, paroxysmal atrial fibrillation, aortic stenosis, essential hypertension, history of B-cell lymphoma in remission, chronic kidney disease, previous TIA, aortic aneurysm measuring 4.3 cm and previous tobacco use.    He has been doing well since his TAVR in October 2020.  Cardiac catheterization in August 2020 showed minimal nonobstructive coronary artery disease.   He denies chest pain or shortness of breath. His functional capacity has gradually improved.  I reviewed his most recent labs from last month which showed improvement in renal function with a creatinine of 1.1.  Past Medical History:  Diagnosis Date  . Aortic aneurysm ([place])   . Asthma   . Carotid artery disease ([place])   . Chronic diastolic CHF (congestive heart failure) ([place])   . Diabetes mellitus ([place])   . Hammertoe   . History of TIA (transient ischemic attack)   . HTN (hypertension)   . Lymphoma ([place])   . PAF (paroxysmal atrial fibrillation) ([place])   . Pneumonia    recurrent PNA  . Radiculitis, lumbosacral    s/p spinal injections  . 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 Burt Knack and Dr. Cyndia Bent  . Severe aortic stenosis     Past Surgical History:  Procedure Laterality Date  . BACK SURGERY  12/89  . CATARACT EXTRACTION W/ INTRAOCULAR LENS IMPLANT Right 12/00  . CATARACT EXTRACTION W/ INTRAOCULAR LENS IMPLANT Left 8/01  . foot  sugery Right 6/03  . FOOT SURGERY Left 1/96   hammertoe  . KIDNEY STONE SURGERY  7/90  . RIGHT/LEFT HEART CATH AND CORONARY ANGIOGRAPHY N/A 08/10/2019   Procedure: RIGHT/LEFT HEART CATH AND CORONARY ANGIOGRAPHY;  Surgeon: Wellington Hampshire, MD;  [facility];  Service: Cardiovascular;  Laterality: N/A;  . ROTATOR CUFF REPAIR Right 1/98  . TEE WITHOUT CARDIOVERSION N/A 09/22/2019   Procedure: TRANSESOPHAGEAL ECHOCARDIOGRAM (TEE);  Surgeon: Sherren Mocha, MD;  Location: [facility];  Service: Open Heart Surgery;  Laterality: N/A;  . TOTAL KNEE ARTHROPLASTY Bilateral 1987  . TRANSCATHETER AORTIC VALVE REPLACEMENT, TRANSFEMORAL N/A 09/22/2019   Procedure: TRANSCATHETER AORTIC VALVE REPLACEMENT, TRANSFEMORAL;  Surgeon: Sherren Mocha, MD;  Location: [facility];  Service: Open Heart Surgery;  Laterality: N/A;     Current Outpatient Medications  Medication Sig Dispense Refill  . acetaminophen (TYLENOL) 500 MG tablet Take 1,000 mg by mouth every 6 (six) hours as needed for mild pain, moderate pain or fever.    Marland Kitchen apixaban (ELIQUIS) 2.5 MG TABS tablet Take 1 tablet (2.5 mg total) by mouth 2 (two) times daily.    Marland Kitchen aspirin 81 MG chewable tablet Chew 1 tablet (81 mg total) by mouth daily.    Marland Kitchen atorvastatin (LIPITOR) 40 MG tablet Take 40 mg by mouth daily.    Marland Kitchen azelastine (ASTELIN) 0.1 % nasal spray Place 1 spray into both  nostrils 2 (two) times daily as needed for rhinitis or allergies. Use in each nostril as directed    . cetirizine (ZYRTEC) 10 MG tablet Take 10 mg by mouth daily as needed for allergies.     . famotidine (PEPCID) 10 MG tablet Take 10 mg by mouth daily as needed for heartburn or indigestion.    . fluticasone (FLOVENT HFA) 110 MCG/ACT inhaler Inhale 1 puff into the lungs 2 (two) times daily as needed (respiratory symptoms).     . furosemide (LASIX) 20 MG tablet Take 10 mg by mouth daily as needed.     Marland Kitchen guaiFENesin (MUCINEX) 600 MG 12 hr tablet Take 600  mg by mouth 2 (two) times daily as needed for cough or to loosen phlegm.    . Ipratropium-Albuterol (COMBIVENT) 20-100 MCG/ACT AERS respimat Inhale 1 puff into the lungs every 6 (six) hours. (Patient taking differently: Inhale 1 puff into the lungs every 6 (six) hours as needed for wheezing or shortness of breath. ) 4 g 0  . lactulose (CHRONULAC) 10 GM/15ML solution Take 20 g by mouth daily as needed for moderate constipation.     . meclizine (ANTIVERT) 12.5 MG tablet Take 12.5 mg by mouth 3 (three) times daily as needed for dizziness.    . metFORMIN (GLUCOPHAGE) 500 MG tablet Take 1 tablet (500 mg total) by mouth 2 (two) times daily with a meal. (Patient taking differently: Take 500 mg by mouth daily with breakfast. ) 180 tablet 0  . metoprolol succinate (TOPROL-XL) 50 MG 24 hr tablet Take 1 tablet (50 mg total) by mouth daily. Take with or immediately following a meal. 30 tablet 0  . mirtazapine (REMERON) 7.5 MG tablet Take 7.5 mg by mouth at bedtime.    Marland Kitchen omeprazole (PRILOSEC) 20 MG capsule Take 20 mg by mouth daily as needed (stomach acid).    . ondansetron (ZOFRAN-ODT) 8 MG disintegrating tablet Take 8 mg by mouth every 8 (eight) hours as needed for nausea or vomiting.    . polyethylene glycol (MIRALAX) 17 g packet Take 17 g by mouth daily as needed for moderate constipation. 14 each 0  . tamsulosin (FLOMAX) 0.4 MG CAPS capsule Take 1 capsule (0.4 mg total) by mouth daily after supper. 90 capsule 3   No current facility-administered medications for this visit.    Allergies:   Penicillins    Social History:  The patient  reports that he has quit smoking. He has never used smokeless tobacco. He reports that he does not drink alcohol or use drugs.   Family History:  The patient's family history includes Heart disease in his father.    ROS:  Please see the history of present illness.   Otherwise, review of systems are positive for none.   All other systems are reviewed and negative.     PHYSICAL EXAM: VS:  BP (!) 150/92 (BP Location: Left Arm, Patient Position: Sitting, Cuff Size: Normal)   Pulse 76   Ht 5\' 11"  (1.803 m)   Wt 191 lb 2 oz (86.7 kg)   SpO2 97%   BMI 26.66 kg/m  , BMI Body mass index is 26.66 kg/m. GEN: Well nourished, well developed, in no acute distress  HEENT: normal  Neck: no JVD, carotid bruits, or masses Cardiac: RRR; no  rubs, or gallops.  No murmurs heard today.  He has trace bilateral leg edema Respiratory:  clear to auscultation bilaterally, normal work of breathing GI: soft, nontender, nondistended, + BS MS: no deformity or  atrophy  Skin: warm and dry, no rash Neuro:  Strength and sensation are intact Psych: euthymic mood, full affect   EKG:  EKG is  ordered today. EKG showed normal sinus rhythm with left atrial enlargement, LVH with repolarization abnormalities.   Recent Labs: 09/18/2019: ALT 22; B Natriuretic Peptide 461.5 09/23/2019: Magnesium 2.0 10/01/2019: BUN 15; Creatinine, Ser 1.30; Hemoglobin 13.5; Platelets 149; Potassium 4.2; Sodium 141    Lipid Panel No results found for: CHOL, TRIG, HDL, CHOLHDL, VLDL, LDLCALC, LDLDIRECT    Wt Readings from Last 3 Encounters:  03/24/20 191 lb 2 oz (86.7 kg)  01/19/20 190 lb 12.8 oz (86.5 kg)  01/14/20 186 lb (84.4 kg)       No flowsheet data found.    ASSESSMENT AND PLAN:  1.  Status post TAVR for severe aortic stenosis: Significant improvement in symptoms.  Most recent echo in October showed normal ejection fraction at 50 to 55% with normal functioning TAVR prosthesis.  Currently New York Heart Association class II.  2.  chronic diastolic heart failure: He seems to be euvolemic on current dose of furosemide.  I made no changes.   3.  Essential hypertension: Blood pressure is mildly elevated.  His daughter reports better blood pressure at home.  Continue to monitor for now.  He is on Toprol 50 mg daily.  3.  Paroxysmal atrial fibrillation: He is in sinus rhythm today.   Recent labs showed gradual improvement in creatinine which was 1.1.  Thus, I elected to increase Eliquis back to 5 mg twice daily and discontinue aspirin.   Disposition:   FU with me in 6 months.  Signed,  [name], MD  03/24/2020 11:32 AM    [place]

## 2020-08-15 ENCOUNTER — Other Ambulatory Visit: Payer: Self-pay | Admitting: Physician Assistant

## 2020-09-08 ENCOUNTER — Other Ambulatory Visit: Payer: Medicare Other

## 2020-09-16 ENCOUNTER — Ambulatory Visit (INDEPENDENT_AMBULATORY_CARE_PROVIDER_SITE_OTHER): Payer: Medicare Other

## 2020-09-16 ENCOUNTER — Other Ambulatory Visit: Payer: Self-pay

## 2020-09-16 DIAGNOSIS — Z952 Presence of prosthetic heart valve: Secondary | ICD-10-CM

## 2020-09-16 LAB — ECHOCARDIOGRAM COMPLETE
AR max vel: 2.93 cm2
AV Area VTI: 2.8 cm2
AV Area mean vel: 2.93 cm2
AV Mean grad: 5.3 mmHg
AV Peak grad: 9 mmHg
Ao pk vel: 1.5 m/s
Area-P 1/2: 2.07 cm2
Calc EF: 53.8 %
S' Lateral: 2.8 cm
Single Plane A2C EF: 51.6 %
Single Plane A4C EF: 53.8 %

## 2020-09-29 ENCOUNTER — Ambulatory Visit (INDEPENDENT_AMBULATORY_CARE_PROVIDER_SITE_OTHER): Payer: Medicare Other | Admitting: Cardiovascular Disease

## 2020-09-29 ENCOUNTER — Other Ambulatory Visit: Payer: Self-pay

## 2020-09-29 ENCOUNTER — Encounter: Payer: Self-pay | Admitting: Cardiovascular Disease

## 2020-09-29 VITALS — BP 132/80 | HR 73 | Ht 71.0 in | Wt 180.4 lb

## 2020-09-29 DIAGNOSIS — I5032 Chronic diastolic (congestive) heart failure: Secondary | ICD-10-CM

## 2020-09-29 DIAGNOSIS — I480 Paroxysmal atrial fibrillation: Secondary | ICD-10-CM

## 2020-09-29 DIAGNOSIS — Z952 Presence of prosthetic heart valve: Secondary | ICD-10-CM | POA: Diagnosis not present

## 2020-09-29 DIAGNOSIS — I10 Essential (primary) hypertension: Secondary | ICD-10-CM

## 2020-09-29 NOTE — Progress Notes (Signed)
Cardiology Office Note   Date:  09/29/2020   ID:  Jacob Simpson, DOB [birth year], MRN [id number]  PCP:  Barbaraann Boys, MD  Cardiologist:   [name], MD   Chief Complaint  Patient presents with  . Follow-up    6 month follow up & discuss Echo results. Meds reviewed by the pt.'s daughter [name]). "doing well."      History of Present Illness: Jacob Simpson is a 86 y.o. male who presents for a follow-up visit regarding severe aortic stenosis status post TAVR in October 2020.   He has history of chronic diastolic heart failure, paroxysmal atrial fibrillation, aortic stenosis, essential hypertension, history of B-cell lymphoma in remission, chronic kidney disease, previous TIA, aortic aneurysm measuring 4.3 cm and previous tobacco use.    He has been doing well since his TAVR in October 2020.  Cardiac catheterization in August 2020 showed minimal nonobstructive coronary artery disease.   He denies chest pain or shortness of breath. His functional capacity has gradually improved.  No side effects with anticoagulation.  He denies tachycardia or recurrent palpitations. He had an echocardiogram done earlier this month which showed an EF of 55 to 33%, grade 1 diastolic dysfunction, moderate mitral regurgitation, normal functioning TAVR prosthesis with mean gradient of 5.2 and mildly dilated ascending aorta at 40 mmHg.  Past Medical History:  Diagnosis Date  . Aortic aneurysm ([place])   . Asthma   . Carotid artery disease ([place])   . Chronic diastolic CHF (congestive heart failure) ([place])   . Diabetes mellitus ([place])   . Hammertoe   . History of TIA (transient ischemic attack)   . HTN (hypertension)   . Lymphoma ([place])   . PAF (paroxysmal atrial fibrillation) ([place])   . Pneumonia    recurrent PNA  . Radiculitis, lumbosacral    s/p spinal injections  . 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 Burt Knack and Dr. Cyndia Bent  . Severe aortic stenosis     Past Surgical History:  Procedure Laterality Date  . BACK SURGERY  12/89  . CATARACT EXTRACTION W/ INTRAOCULAR LENS IMPLANT Right 12/00  . CATARACT EXTRACTION W/ INTRAOCULAR LENS IMPLANT Left 8/01  . foot sugery Right 6/03  . FOOT SURGERY Left 1/96   hammertoe  . KIDNEY STONE SURGERY  7/90  . RIGHT/LEFT HEART CATH AND CORONARY ANGIOGRAPHY N/A 08/10/2019   Procedure: RIGHT/LEFT HEART CATH AND CORONARY ANGIOGRAPHY;  Surgeon: Wellington Hampshire, MD;  Location: [facility];  Service: Cardiovascular;  Laterality: N/A;  . ROTATOR CUFF REPAIR Right 1/98  . TEE WITHOUT CARDIOVERSION N/A 09/22/2019   Procedure: TRANSESOPHAGEAL ECHOCARDIOGRAM (TEE);  Surgeon: Sherren Mocha, MD;  Location: [facility];  Service: Open Heart Surgery;  Laterality: N/A;  . TOTAL KNEE ARTHROPLASTY Bilateral 1987  . TRANSCATHETER AORTIC VALVE REPLACEMENT, TRANSFEMORAL N/A 09/22/2019   Procedure: TRANSCATHETER AORTIC VALVE REPLACEMENT, TRANSFEMORAL;  Surgeon: Sherren Mocha, MD;  Location: [facility];  Service: Open Heart Surgery;  Laterality: N/A;     Current Outpatient Medications  Medication Sig Dispense Refill  . acetaminophen (TYLENOL) 500 MG tablet Take 1,000 mg by mouth every 6 (six) hours as needed for mild pain, moderate pain or fever.    Marland Kitchen apixaban (ELIQUIS) 5 MG TABS tablet Take 1 tablet (5 mg total) by mouth 2 (two) times daily.    Marland Kitchen atorvastatin (LIPITOR) 40 MG  tablet Take 40 mg by mouth daily.    Marland Kitchen azelastine (ASTELIN) 0.1 % nasal spray Place 1 spray into both nostrils 2 (two) times daily as needed for rhinitis or allergies. Use in each nostril as directed    . cetirizine (ZYRTEC) 10 MG tablet Take 10 mg by mouth daily as needed for allergies.     . famotidine (PEPCID) 10 MG tablet Take 10 mg by mouth daily as needed for heartburn or indigestion.    . meclizine (ANTIVERT) 12.5 MG tablet Take 12.5 mg by mouth 3 (three)  times daily as needed for dizziness.    . metoprolol succinate (TOPROL-XL) 50 MG 24 hr tablet Take 1 tablet (50 mg total) by mouth daily. Take with or immediately following a meal. 30 tablet 0  . mirtazapine (REMERON) 7.5 MG tablet Take 7.5 mg by mouth at bedtime.    Marland Kitchen omeprazole (PRILOSEC) 20 MG capsule Take 20 mg by mouth daily as needed (stomach acid).    . ondansetron (ZOFRAN-ODT) 8 MG disintegrating tablet Take 8 mg by mouth every 8 (eight) hours as needed for nausea or vomiting.    . polyethylene glycol (MIRALAX) 17 g packet Take 17 g by mouth daily as needed for moderate constipation. 14 each 0  . tamsulosin (FLOMAX) 0.4 MG CAPS capsule Take 1 capsule (0.4 mg total) by mouth daily after supper. 90 capsule 3  . furosemide (LASIX) 20 MG tablet Take 10 mg by mouth daily as needed.  (Patient not taking: Reported on 09/29/2020)     No current facility-administered medications for this visit.    Allergies:   Penicillins    Social History:  The patient  reports that he has quit smoking. He has never used smokeless tobacco. He reports that he does not drink alcohol and does not use drugs.   Family History:  The patient's family history includes Heart disease in his father.    ROS:  Please see the history of present illness.   Otherwise, review of systems are positive for none.   All other systems are reviewed and negative.    PHYSICAL EXAM: VS:  BP 132/80 (BP Location: Left Arm, Patient Position: Sitting, Cuff Size: Normal)   Pulse 73   Ht 5\' 11"  (1.803 m)   Wt 180 lb 6 oz (81.8 kg)   SpO2 98%   BMI 25.16 kg/m  , BMI Body mass index is 25.16 kg/m. GEN: Well nourished, well developed, in no acute distress  HEENT: normal  Neck: no JVD, carotid bruits, or masses Cardiac: RRR; no  rubs, or gallops.  No murmurs heard today.  He has trace bilateral leg edema Respiratory:  clear to auscultation bilaterally, normal work of breathing GI: soft, nontender, nondistended, + BS MS: no  deformity or atrophy  Skin: warm and dry, no rash Neuro:  Strength and sensation are intact Psych: euthymic mood, full affect   EKG:  EKG is  ordered today. EKG showed sinus rhythm with a PVC, incomplete left bundle branch block, moderate LVH.  Nonspecific repolarization abnormalities.   Recent Labs: 10/01/2019: BUN 15; Creatinine, Ser 1.30; Hemoglobin 13.5; Platelets 149; Potassium 4.2; Sodium 141    Lipid Panel No results found for: CHOL, TRIG, HDL, CHOLHDL, VLDL, LDLCALC, LDLDIRECT    Wt Readings from Last 3 Encounters:  09/29/20 180 lb 6 oz (81.8 kg)  03/24/20 191 lb 2 oz (86.7 kg)  01/19/20 190 lb 12.8 oz (86.5 kg)       No flowsheet data found.  ASSESSMENT AND PLAN:  1.  Status post TAVR for severe aortic stenosis: Significant improvement in symptoms.  Most recent echo earlier this month showed normal ejection fraction at 50 to 55% with normal functioning TAVR prosthesis.  Currently New York Heart Association class II.  2.  chronic diastolic heart failure: He seems to be euvolemic on current dose of furosemide.  I made no changes.   3.  Essential hypertension: Blood pressure is well controlled.  3.  Paroxysmal atrial fibrillation: He is in sinus rhythm and reports no side effects with anticoagulation.  I reviewed most recent labs done with his primary care physician in March which showed normal hemoglobin and stable renal function with a creatinine of 1.1.   Disposition:   FU with me in 6 months.  Signed,  [name], MD  09/29/2020 5:22 PM    [facility]

## 2020-09-29 NOTE — Patient Instructions (Signed)
Medication Instructions:  No changes  *If you need a refill on your cardiac medications before your next appointment, please call your pharmacy*   Lab Work: None  If you have labs (blood work) drawn today and your tests are completely normal, you will receive your results only by: Marland Kitchen MyChart Message (if you have MyChart) OR . A paper copy in the mail If you have any lab test that is abnormal or we need to change your treatment, we will call you to review the results.   Testing/Procedures: None   Follow-Up: At [facility], you and your health needs are our priority.  As part of our continuing mission to provide you with exceptional heart care, we have created designated Provider Care Teams.  These Care Teams include your primary Cardiologist (physician) and Advanced Practice Providers (APPs -  Physician Assistants and Nurse Practitioners) who all work together to provide you with the care you need, when you need it.   Your next appointment:   6 month(s)  The format for your next appointment:   In Person  Provider:   You may see Dr. Fletcher Anon or one of the following Advanced Practice Providers on your designated Care Team:    Murray Hodgkins, NP  Christell Faith, PA-C  Marrianne Mood, PA-C  Cadence [place], [place]

## 2020-12-18 ENCOUNTER — Ambulatory Visit
Admission: EM | Admit: 2020-12-18 | Discharge: 2020-12-18 | Disposition: A | Discharge status: Home / Self Care | Payer: Medicare Other | Attending: Family Medicine | Admitting: Family Medicine

## 2020-12-18 ENCOUNTER — Other Ambulatory Visit: Payer: Self-pay

## 2020-12-18 ENCOUNTER — Ambulatory Visit (INDEPENDENT_AMBULATORY_CARE_PROVIDER_SITE_OTHER): Payer: Medicare Other

## 2020-12-18 DIAGNOSIS — Z20822 Contact with and (suspected) exposure to covid-19: Secondary | ICD-10-CM | POA: Insufficient documentation

## 2020-12-18 DIAGNOSIS — R059 Cough, unspecified: Secondary | ICD-10-CM | POA: Diagnosis not present

## 2020-12-18 DIAGNOSIS — J209 Acute bronchitis, unspecified: Secondary | ICD-10-CM | POA: Diagnosis not present

## 2020-12-18 IMAGING — CR DG CHEST 2V
3 series · 3 of 3 positions shown · non-contrast
Comparison: [DATE]

CLINICAL DATA: Cough.  Recent COVID exposure.

EXAM:
CHEST - 2 VIEW

[chest lat]
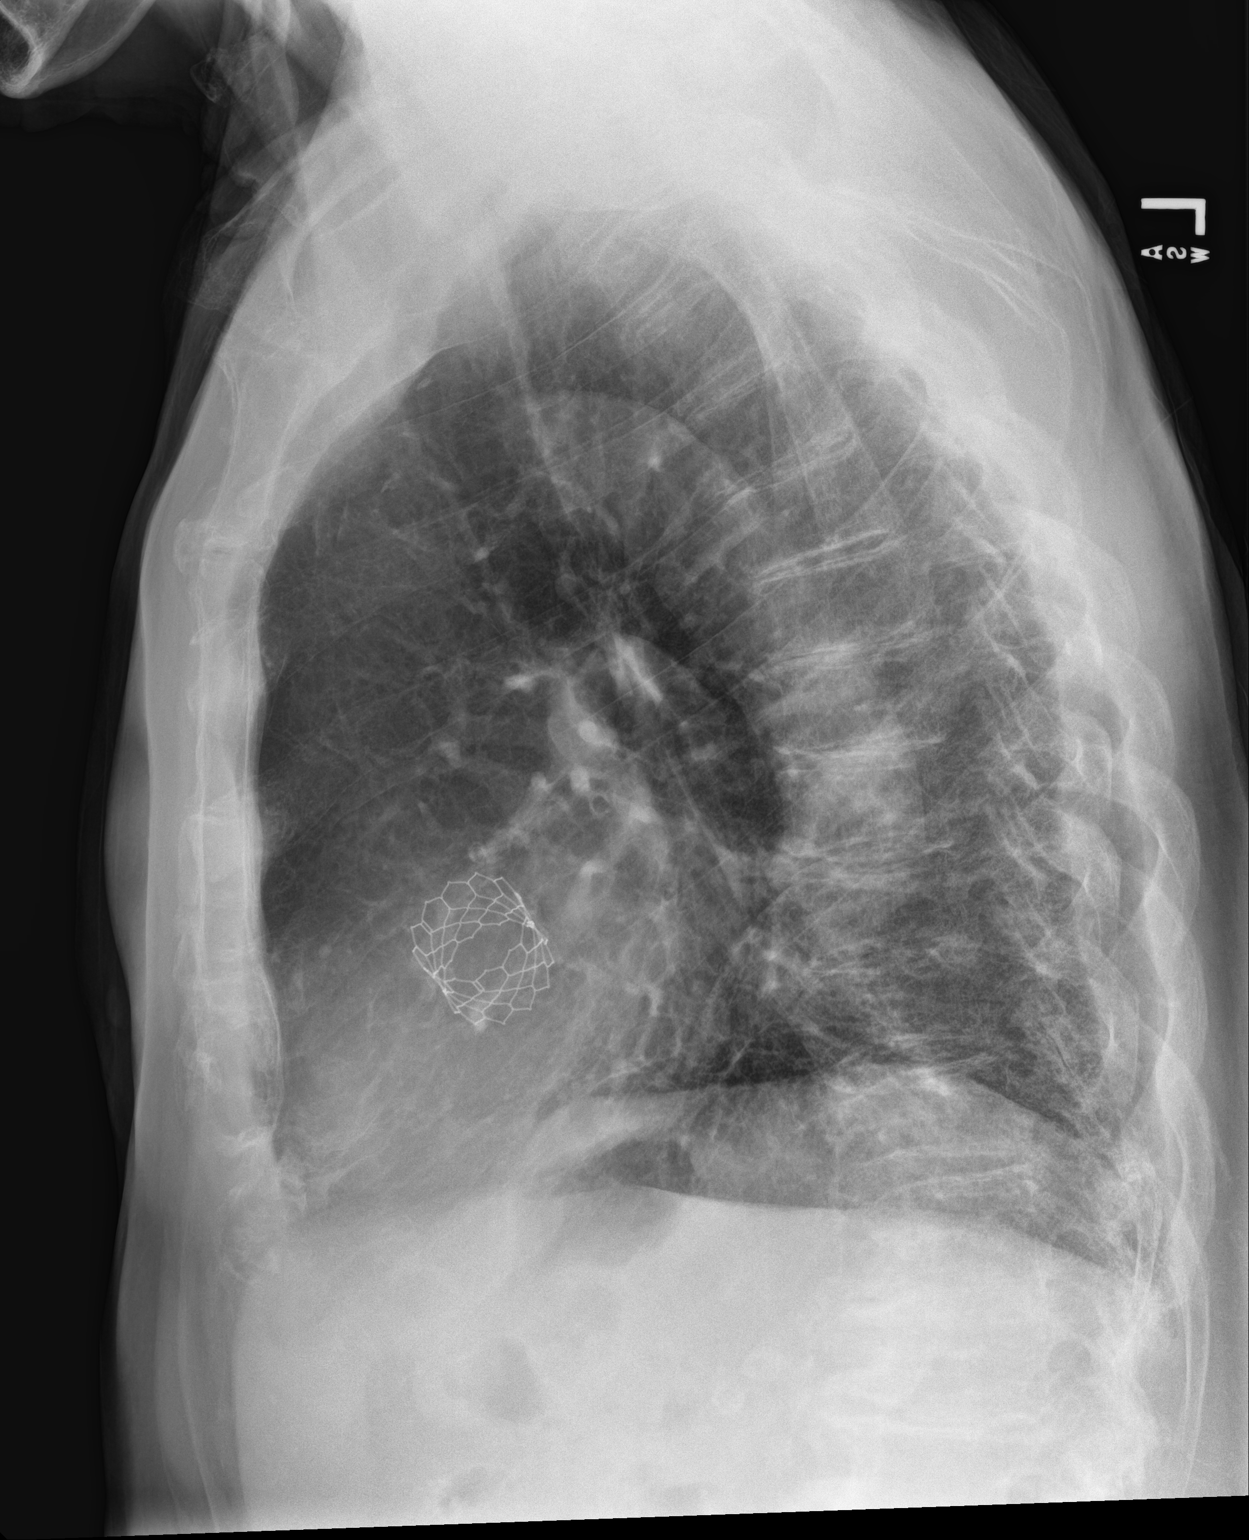

[chest pa (1 of 2)]
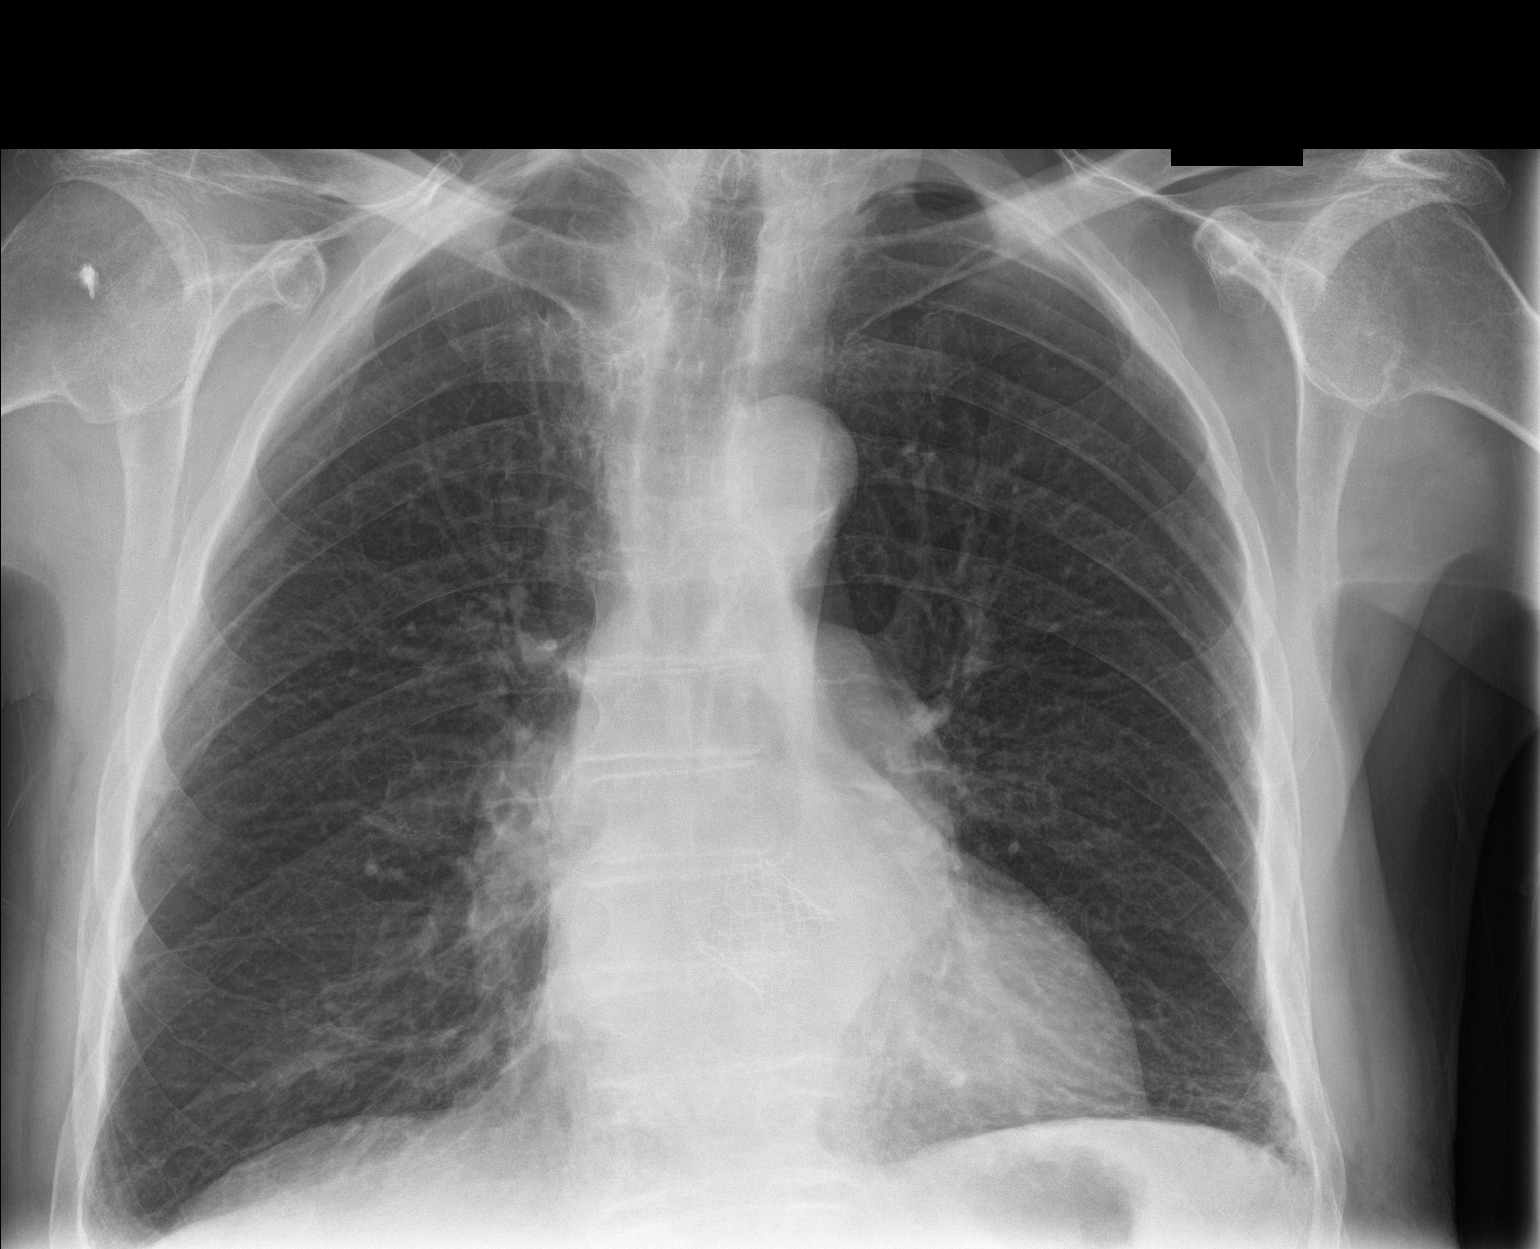

[chest pa (2 of 2)]
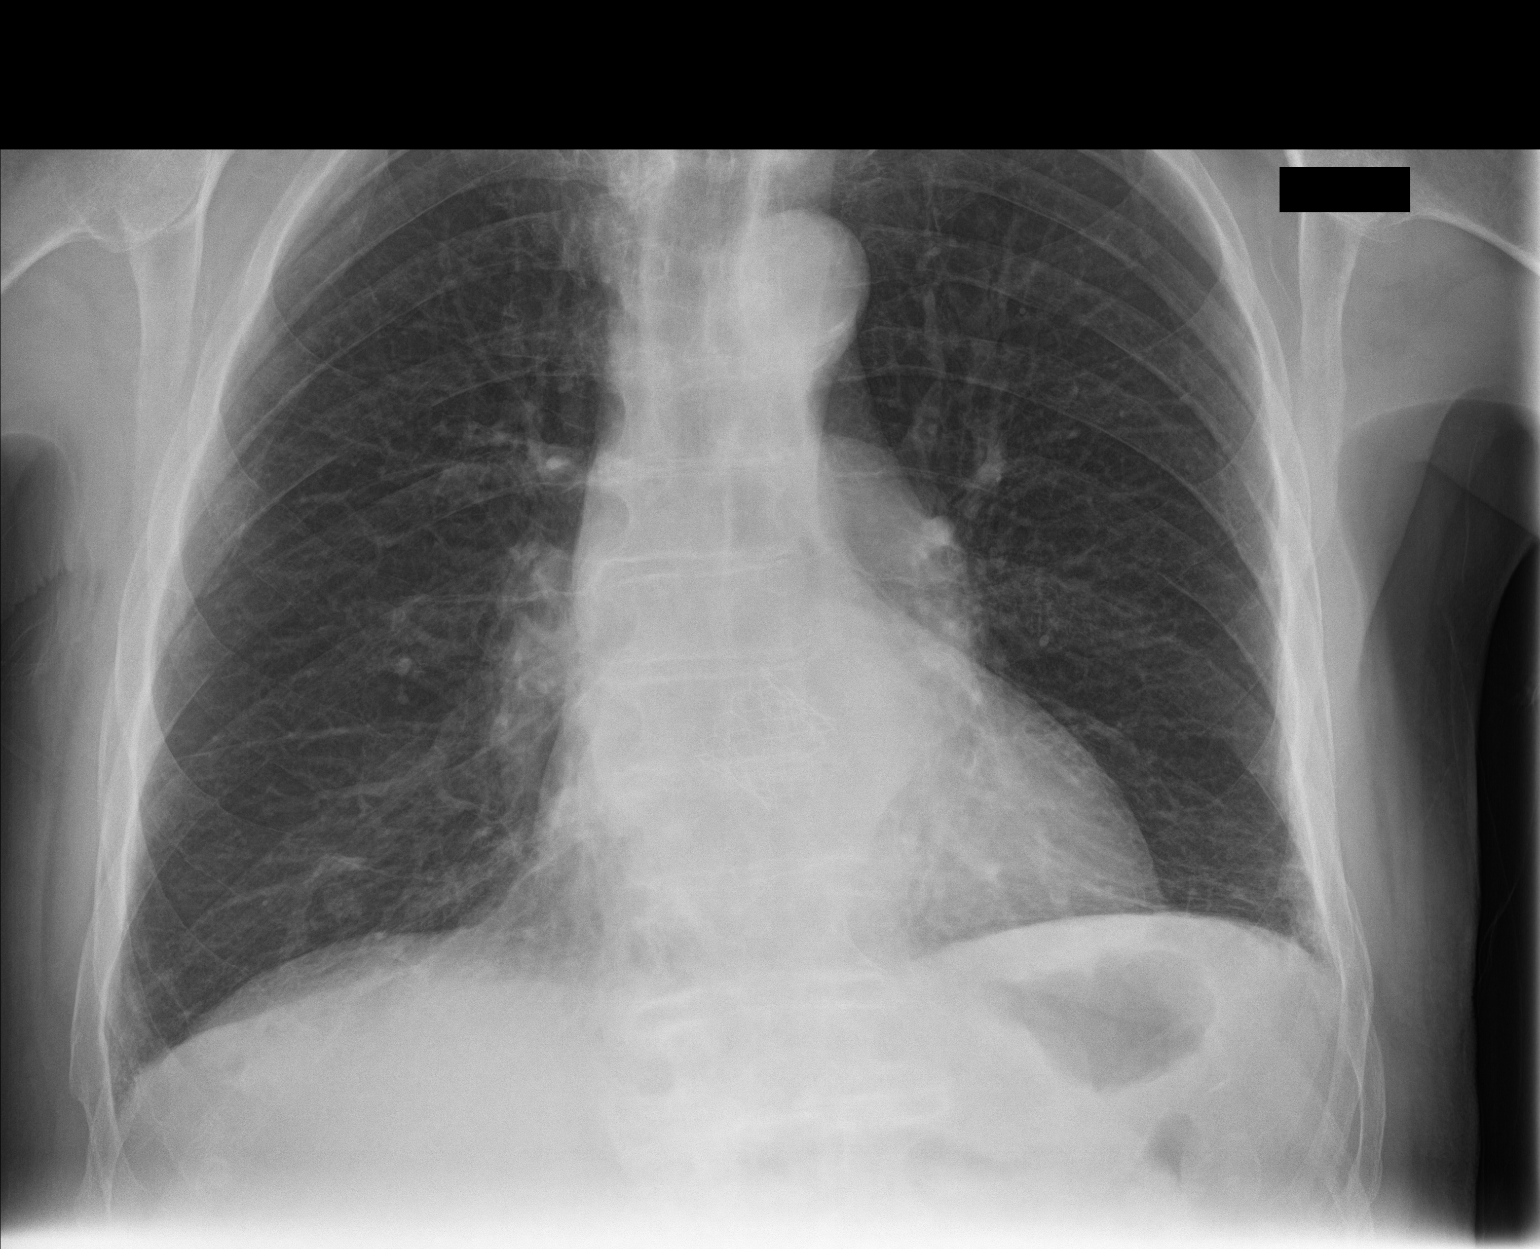

[3 of 3 positions shown; findings below may reference images not displayed]

FINDINGS: There is hyperinflation of the lungs compatible with COPD. Prior
TAVR. Heart is normal size. Linear scarring in the left base. No
confluent opacities or effusions.
IMPRESSION: COPD/chronic changes.  No active disease.

## 2020-12-18 MED ORDER — AZITHROMYCIN 250 MG PO TABS: ORAL_TABLET | ORAL | 0 refills | Status: DC

## 2020-12-18 MED ORDER — PREDNISONE 50 MG PO TABS: ORAL_TABLET | ORAL | 0 refills | Status: DC

## 2020-12-18 MED ORDER — ALBUTEROL SULFATE HFA 108 (90 BASE) MCG/ACT IN AERS: 1.0000 | INHALATION_SPRAY | Freq: Four times a day (QID) | RESPIRATORY_TRACT | 0 refills | Status: DC | PRN

## 2020-12-18 NOTE — ED Provider Notes (Signed)
[facility]    CSN: [id number] Arrival date & time: 12/18/20  1031  History   Chief Complaint Chief Complaint  Patient presents with  . Cough   HPI  87 year old male presents with cough.   1 week history of cough. Non productive. No fever. Has recently been exposed to COVID. No reported SOB. Has ongoing lower extremity edema. No relieving factors. No other complaints at this time.  Past Medical History:  Diagnosis Date  . Aortic aneurysm (HCC)   . Asthma   . Carotid artery disease (HCC)   . Chronic diastolic CHF (congestive heart failure) (HCC)   . Diabetes mellitus (HCC)   . Hammertoe   . History of TIA (transient ischemic attack)   . HTN (hypertension)   . Lymphoma (HCC)   . PAF (paroxysmal atrial fibrillation) (HCC)   . Pneumonia    recurrent PNA  . Radiculitis, lumbosacral    s/p spinal injections  . 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 Excell Seltzer and Dr. Laneta Simmers  . Severe aortic stenosis     Patient Active Problem List   Diagnosis Date Noted  . 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 Radiculitis, lumbosacral   . PAF (paroxysmal atrial fibrillation) (HCC)   . Lymphoma (HCC)   . Carotid artery disease (HCC)   . History of TIA (transient ischemic attack)   . HTN (hypertension) 03/10/2019  . Severe aortic stenosis 03/10/2019  . Acute on chronic diastolic heart failure (HCC) 02/24/2019    Past Surgical History:  Procedure Laterality Date  . BACK SURGERY  12/89  . CATARACT EXTRACTION W/ INTRAOCULAR LENS IMPLANT Right 12/00  . CATARACT EXTRACTION W/ INTRAOCULAR LENS IMPLANT Left 8/01  . foot sugery Right 6/03  . FOOT SURGERY Left 1/96   hammertoe  . KIDNEY STONE SURGERY  7/90  . RIGHT/LEFT HEART CATH AND CORONARY ANGIOGRAPHY N/A 08/10/2019   Procedure: RIGHT/LEFT HEART CATH AND CORONARY ANGIOGRAPHY;  Surgeon: Iran Ouch,  MD;  Location: [facility] INVASIVE CV LAB;  Service: Cardiovascular;  Laterality: N/A;  . ROTATOR CUFF REPAIR Right 1/98  . TEE WITHOUT CARDIOVERSION N/A 09/22/2019   Procedure: TRANSESOPHAGEAL ECHOCARDIOGRAM (TEE);  Surgeon: Tonny Bollman, MD;  Location: [facility] INVASIVE CV LAB;  Service: Open Heart Surgery;  Laterality: N/A;  . TOTAL KNEE ARTHROPLASTY Bilateral 1987  . TRANSCATHETER AORTIC VALVE REPLACEMENT, TRANSFEMORAL N/A 09/22/2019   Procedure: TRANSCATHETER AORTIC VALVE REPLACEMENT, TRANSFEMORAL;  Surgeon: Tonny Bollman, MD;  Location: [facility] INVASIVE CV LAB;  Service: Open Heart Surgery;  Laterality: N/A;       Home Medications    Prior to Admission medications   Medication Sig Start Date End Date Taking? Authorizing Provider  albuterol (VENTOLIN HFA) 108 (90 Base) MCG/ACT inhaler Inhale 1-2 puffs into the lungs every 6 (six) hours as needed for wheezing or shortness of breath. 12/18/20  Yes Kota Ciancio G, DO  azithromycin (ZITHROMAX) 250 MG tablet 2 tablets on day 1, then 1 tablet daily on days 2-5. 12/18/20  Yes Adriana Simas, [place] G, DO  predniSONE (DELTASONE) 50 MG tablet 1 tablet daily x 5 days 12/18/20  Yes Milania Haubner G, DO  acetaminophen (TYLENOL) 500 MG tablet Take 1,000 mg by mouth every 6 (six) hours as needed for mild pain, moderate pain or fever.    [provider]  apixaban (ELIQUIS) 5 MG TABS tablet Take 1 tablet (5 mg  total) by mouth 2 (two) times daily. 03/24/20   Iran Ouch, MD  atorvastatin (LIPITOR) 40 MG tablet Take 40 mg by mouth daily.    [provider]  azelastine (ASTELIN) 0.1 % nasal spray Place 1 spray into both nostrils 2 (two) times daily as needed for rhinitis or allergies. Use in each nostril as directed    [provider]  cetirizine (ZYRTEC) 10 MG tablet Take 10 mg by mouth daily as needed for allergies.     [provider]  famotidine (PEPCID) 10 MG tablet Take 10 mg by mouth daily as needed for heartburn or indigestion.    [provider]  furosemide (LASIX) 20 MG tablet Take 10 mg by mouth daily as needed.  Patient not taking: No sig reported    [provider]  meclizine (ANTIVERT) 12.5 MG tablet Take 12.5 mg by mouth 3 (three) times daily as needed for dizziness.    [provider]  metoprolol succinate (TOPROL-XL) 50 MG 24 hr tablet Take 1 tablet (50 mg total) by mouth daily. Take with or immediately following a meal. 07/08/19   Alford Highland, MD  mirtazapine (REMERON) 7.5 MG tablet Take 7.5 mg by mouth at bedtime.    [provider]  omeprazole (PRILOSEC) 20 MG capsule Take 20 mg by mouth daily as needed (stomach acid).    [provider]  ondansetron (ZOFRAN-ODT) 8 MG disintegrating tablet Take 8 mg by mouth every 8 (eight) hours as needed for nausea or vomiting.    [provider]  polyethylene glycol (MIRALAX) 17 g packet Take 17 g by mouth daily as needed for moderate constipation. 07/08/19   Alford Highland, MD  tamsulosin (FLOMAX) 0.4 MG CAPS capsule Take 1 capsule (0.4 mg total) by mouth daily after supper. 08/04/19   Sondra Come, MD    Family History Family History  Problem Relation Age of Onset  . Heart disease Father     Social History Social History   Tobacco Use  . Smoking status: Former Games developer  . Smokeless tobacco: Never Used  . Tobacco comment: quit 1989  Vaping Use  . Vaping Use: Never used  Substance Use Topics  . Alcohol use: No  . Drug use: No     Allergies   Penicillins   Review of Systems Review of Systems  Constitutional: Negative for fever.  Respiratory: Positive for cough.    Physical Exam Triage Vital Signs ED Triage Vitals  Enc Vitals Group     BP 12/18/20 1307 (!) 184/106     Pulse Rate 12/18/20 1307 83     Resp 12/18/20 1307 19     Temp 12/18/20 1307 98.1 F (36.7 C)     Temp Source 12/18/20 1307 Oral     SpO2 12/18/20 1307 96 %     Weight 12/18/20 1308 170 lb (77.1 kg)     Height 12/18/20 1308 5\' 10"   (1.778 m)     Head Circumference --      Peak Flow --      Pain Score 12/18/20 1308 0     Pain Loc --      Pain Edu? --      Excl. in GC? --    Updated Vital Signs BP (!) 184/106 (BP Location: Right Arm)   Pulse 83   Temp 98.1 F (36.7 C) (Oral)   Resp 19   Ht 5\' 10"  (1.778 m)   Wt 77.1 kg   SpO2 96%  BMI 24.39 kg/m   Visual Acuity Right Eye Distance:   Left Eye Distance:   Bilateral Distance:    Right Eye Near:   Left Eye Near:    Bilateral Near:     Physical Exam Constitutional:      General: He is not in acute distress.    Appearance: Normal appearance.  HENT:     Head: Normocephalic and atraumatic.  Eyes:     General:        Right eye: No discharge.        Left eye: No discharge.     Conjunctiva/sclera: Conjunctivae normal.  Cardiovascular:     Rate and Rhythm: Normal rate and regular rhythm.     Heart sounds: No murmur heard.   Pulmonary:     Effort: Pulmonary effort is normal.     Breath sounds: Wheezing and rhonchi present.  Neurological:     Mental Status: He is alert.  Psychiatric:        Mood and Affect: Mood normal.        Behavior: Behavior normal.    UC Treatments / Results  Labs (all labs ordered are listed, but only abnormal results are displayed) Labs Reviewed  SARS CORONAVIRUS 2 (TAT 6-24 HRS)    EKG   Radiology DG Chest 2 View  Result Date: 12/18/2020 CLINICAL DATA:  Cough.  Recent COVID exposure. EXAM: CHEST - 2 VIEW COMPARISON:  09/22/2019 FINDINGS: There is hyperinflation of the lungs compatible with COPD. Prior TAVR. Heart is normal size. Linear scarring in the left base. No confluent opacities or effusions. IMPRESSION: COPD/chronic changes.  No active disease. Electronically Signed   By: Rolm Baptise M.D.   On: 12/18/2020 13:25    Procedures Procedures (including critical care time)  Medications Ordered in UC Medications - No data to display  Initial Impression / Assessment and Plan / UC Course  I have reviewed the  triage vital signs and the nursing notes.  Pertinent labs & imaging results that were available during my care of the patient were reviewed by me and considered in my medical decision making (see chart for details).    87 year old male presents with bronchitis. Xray obtained and was independently reviewed by me. Interpretation: Hyperinflation consistent with COPD. S/p Valve replacement. No evidence of pneumonia. Given findings consistent with COPD, patient treated as I would COPD exacerbation. Treating with azithromycin, prednisone, PRN Albuterol.   Final Clinical Impressions(s) / UC Diagnoses   Final diagnoses:  Acute bronchitis, unspecified organism     Discharge Instructions     Results should be available in the next 24 hours.  Medications as prescribed.  If he worsens, he is to go to the ER.  Take care  Dr. Lacinda Axon    ED Prescriptions    Medication Sig Dispense Auth. Provider   predniSONE (DELTASONE) 50 MG tablet 1 tablet daily x 5 days 5 tablet Doyal Saric G, DO   azithromycin (ZITHROMAX) 250 MG tablet 2 tablets on day 1, then 1 tablet daily on days 2-5. 6 tablet Selby Slovacek G, DO   albuterol (VENTOLIN HFA) 108 (90 Base) MCG/ACT inhaler Inhale 1-2 puffs into the lungs every 6 (six) hours as needed for wheezing or shortness of breath. 18 g Coral Spikes, DO     PDMP not reviewed this encounter.   Coral Spikes, [place] 12/18/20 2329

## 2020-12-18 NOTE — ED Triage Notes (Signed)
Pt exposed to COVID one week ago. Has had non productive cough x one week and "not getting better."

## 2020-12-18 NOTE — Discharge Instructions (Signed)
Results should be available in the next 24 hours.  Medications as prescribed.  If he worsens, he is to go to the ER.  Take care  Dr. Adriana Simas

## 2020-12-19 LAB — SARS CORONAVIRUS 2 (TAT 6-24 HRS): SARS Coronavirus 2: NEGATIVE

## 2021-01-01 ENCOUNTER — Other Ambulatory Visit: Payer: Self-pay | Admitting: Family Medicine

## 2021-03-10 ENCOUNTER — Telehealth: Payer: Self-pay | Admitting: Cardiovascular Disease

## 2021-03-10 DIAGNOSIS — I480 Paroxysmal atrial fibrillation: Secondary | ICD-10-CM

## 2021-03-10 NOTE — Telephone Encounter (Signed)
Spoke with the patients daughter Jacob Simpson. Jacob Simpson sts that the pt was notified by his pcp that his Eliquis will no longer be covered by the patients insurance and he will need to switched to coumadin. Marjie Skiff that the patients pcp is the one writing the prescription and that's is why cardiology has not been update with that info. The patient only has 1 day left of Elqiuis on hand.  Marjie Skiff to contact the pt pcp or the pt insurance to ger more info (reason why Eliquis is not being covered and what alternative anticoag is covered).  Marjie Skiff that samples will be left at the front desk to cover the patient for a couple of weeks (3 boxed of Eliquis 5mg ). Jacob Simpson will pick them up this afternoon.  She will also call back with more info regarding the Eliquis denial.  Jacob Simpson voiced appreciation for the assistance.

## 2021-03-10 NOTE — Telephone Encounter (Signed)
Returned the call to the patients daughter [name]. [email or web address].

## 2021-03-10 NOTE — Telephone Encounter (Signed)
Patients daughter calling in to state patients PCP directed patient that [employer]iqu[employer] is not going to be covering his Eliquis anymore. Patients daughter is unsure what the reasoning is. PCP recommended coumadin as an alternative. Daughter is a little hesitant of coumadin as well. Patient needs to be advised and placed on new blood thinner.  Patient has one more day of eliquis at this time.   Please advise

## 2021-03-10 NOTE — Telephone Encounter (Signed)
Daughter returning call

## 2021-03-10 NOTE — Telephone Encounter (Signed)
Patient daughter dropped off [facility] [employer] note Placed in nurse box

## 2021-03-10 NOTE — Telephone Encounter (Signed)
Patient received a letter from [employer] stating that Eliquis will no longer be covered and that Xarelto or Wafarin is the alternative. Message sent to Pharm Ds to see if the can assist.

## 2021-03-13 MED ORDER — APIXABAN 5 MG PO TABS: 5.0000 mg | ORAL_TABLET | Freq: Two times a day (BID) | ORAL | 1 refills | Status: DC

## 2021-03-13 NOTE — Addendum Note (Signed)
Addended by: Marcelle Overlie D on: 03/13/2021 11:23 AM   Modules accepted: Orders

## 2021-03-13 NOTE — Telephone Encounter (Signed)
I spoke with patient's daughter Jacob Simpson (per [facility]). Advised that mail order will not take coupon. Ok with using [facility] in [place]. No PA has been done. I advised her this coupon will work regardless of if insurance will pay or not. Rx called in and card info given to pharmacy. Insurance requested PA, but paid claim w/ $30 copay when copay card billed  RX BIN: [id number] PCN Loyalty GRP [id number] ID [id number]  Will also mail to pt. PA submitted- will appeals if denied.

## 2021-03-13 NOTE — Telephone Encounter (Signed)
Spoke with the patients daughter [name]. [name] verbalized understanding and voiced appreciation for the call.

## 2021-03-13 NOTE — Telephone Encounter (Signed)
Looks like he has a Development worker, international aid. Therefore we can use copay card. I have activated the pt a card. Will call info into his pharmacy when it opens.  Although copay card will work regardless of if it is approved, If you have the denial letter,we would be happy to appeal on pt behalf

## 2021-03-14 NOTE — Telephone Encounter (Signed)
Called daughter sharon (on [place]) to let her know that Eliquis was approved by the insurance. She was appreciative of the follow up.

## 2021-04-13 ENCOUNTER — Other Ambulatory Visit: Payer: Self-pay

## 2021-04-13 ENCOUNTER — Ambulatory Visit (INDEPENDENT_AMBULATORY_CARE_PROVIDER_SITE_OTHER): Payer: Medicare Other | Admitting: Cardiovascular Disease

## 2021-04-13 VITALS — BP 170/80 | HR 76 | Ht 70.0 in | Wt 192.2 lb

## 2021-04-13 DIAGNOSIS — I480 Paroxysmal atrial fibrillation: Secondary | ICD-10-CM | POA: Diagnosis not present

## 2021-04-13 DIAGNOSIS — I10 Essential (primary) hypertension: Secondary | ICD-10-CM

## 2021-04-13 DIAGNOSIS — I5033 Acute on chronic diastolic (congestive) heart failure: Secondary | ICD-10-CM

## 2021-04-13 DIAGNOSIS — Z952 Presence of prosthetic heart valve: Secondary | ICD-10-CM

## 2021-04-13 DIAGNOSIS — I779 Disorder of arteries and arterioles, unspecified: Secondary | ICD-10-CM | POA: Diagnosis not present

## 2021-04-13 MED ORDER — FUROSEMIDE 40 MG PO TABS: 40.0000 mg | ORAL_TABLET | Freq: Every day | ORAL | 5 refills | Status: DC

## 2021-04-13 MED ORDER — POTASSIUM CHLORIDE 20 MEQ/15ML (10%) PO SOLN: 20.0000 meq | Freq: Every day | ORAL | 5 refills | Status: DC

## 2021-04-13 NOTE — Patient Instructions (Signed)
Medication Instructions:  Your physician has recommended you make the following change in your medication:   INCREASE Lasix to 40 mg daily.  START Potassium suspension 51meq/15mL. An Rx has been sent to your pharmacy.  *If you need a refill on your cardiac medications before your next appointment, please call your pharmacy*   Lab Work: Your physician recommends that you return for lab work (bmp) in: 1 week  If you have labs (blood work) drawn today and your tests are completely normal, you will receive your results only by: Marland Kitchen MyChart Message (if you have MyChart) OR . A paper copy in the mail If you have any lab test that is abnormal or we need to change your treatment, we will call you to review the results.   Testing/Procedures: None ordered   Follow-Up: At [facility], you and your health needs are our priority.  As part of our continuing mission to provide you with exceptional heart care, we have created designated Provider Care Teams.  These Care Teams include your primary Cardiologist (physician) and Advanced Practice Providers (APPs -  Physician Assistants and Nurse Practitioners) who all work together to provide you with the care you need, when you need it.  We recommend signing up for the patient portal called "MyChart".  Sign up information is provided on this After Visit Summary.  MyChart is used to connect with patients for Virtual Visits (Telemedicine).  Patients are able to view lab/test results, encounter notes, upcoming appointments, etc.  Non-urgent messages can be sent to your provider as well.   To learn more about what you can do with MyChart, go to [email or web address].    Your next appointment:   3 month(s)  The format for your next appointment:   In Person  Provider:   You may see [name], MD or one of the following Advanced Practice Providers on your designated Care Team:    Murray Hodgkins, NP  Christell Faith, PA-C  Marrianne Mood,  PA-C  Cadence [place], [place]  Laurann [place], NP    Other Instructions N/A

## 2021-04-13 NOTE — Progress Notes (Signed)
Cardiology Office Note   Date:  04/14/2021   ID:  Jacob Simpson, DOB 17-Apr-1930, MRN [id number]  PCP:  Barbaraann Boys, MD  Cardiologist:   [name], MD   Chief Complaint  Patient presents with  . Follow-up    6 month f/u c/o edema ankles and sob with exertion. Meds reviewed verbally with pt.      History of Present Illness: Jacob Simpson is a 87 y.o. male who presents for a follow-up visit regarding severe aortic stenosis status post TAVR in October 2020.   He has history of chronic diastolic heart failure, paroxysmal atrial fibrillation, aortic stenosis, essential hypertension, history of B-cell lymphoma in remission, chronic kidney disease, previous TIA, aortic aneurysm measuring 4.3 cm and previous tobacco use.    He has been doing well since his TAVR in October 2020.  Cardiac catheterization in August 2020 showed minimal nonobstructive coronary artery disease.   Most recent echocardiogram in October 2021 showed an EF of 55 to 84%, grade 1 diastolic dysfunction, moderate mitral regurgitation, normal functioning TAVR prosthesis with mean gradient of 5.2 and mildly dilated ascending aorta at 40 mmHg.  Since last visit, he experienced significant swelling of bilateral lower extremity with 12 pounds of weight gain.  He seems to be more short of breath.  He only takes 10 mg of furosemide as needed.  Past Medical History:  Diagnosis Date  . Aortic aneurysm ([place])   . Asthma   . Carotid artery disease ([place])   . Chronic diastolic CHF (congestive heart failure) ([place])   . Diabetes mellitus ([place])   . Hammertoe   . History of TIA (transient ischemic attack)   . HTN (hypertension)   . Lymphoma ([place])   . PAF (paroxysmal atrial fibrillation) ([place])   . Pneumonia    recurrent PNA  . Radiculitis, lumbosacral    s/p spinal injections  . 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  Burt Knack and Dr. Cyndia Bent  . Severe aortic stenosis     Past Surgical History:  Procedure Laterality Date  . BACK SURGERY  12/89  . CATARACT EXTRACTION W/ INTRAOCULAR LENS IMPLANT Right 12/00  . CATARACT EXTRACTION W/ INTRAOCULAR LENS IMPLANT Left 8/01  . foot sugery Right 6/03  . FOOT SURGERY Left 1/96   hammertoe  . KIDNEY STONE SURGERY  7/90  . RIGHT/LEFT HEART CATH AND CORONARY ANGIOGRAPHY N/A 08/10/2019   Procedure: RIGHT/LEFT HEART CATH AND CORONARY ANGIOGRAPHY;  Surgeon: Wellington Hampshire, MD;  Location: [place] CV LAB;  Service: Cardiovascular;  Laterality: N/A;  . ROTATOR CUFF REPAIR Right 1/98  . TEE WITHOUT CARDIOVERSION N/A 09/22/2019   Procedure: TRANSESOPHAGEAL ECHOCARDIOGRAM (TEE);  Surgeon: Sherren Mocha, MD;  Location: [place] CV LAB;  Service: Open Heart Surgery;  Laterality: N/A;  . TOTAL KNEE ARTHROPLASTY Bilateral 1987  . TRANSCATHETER AORTIC VALVE REPLACEMENT, TRANSFEMORAL N/A 09/22/2019   Procedure: TRANSCATHETER AORTIC VALVE REPLACEMENT, TRANSFEMORAL;  Surgeon: Sherren Mocha, MD;  Location: [place] CV LAB;  Service: Open Heart Surgery;  Laterality: N/A;     Current Outpatient Medications  Medication Sig Dispense Refill  . acetaminophen (TYLENOL) 500 MG tablet Take 1,000 mg by mouth every 6 (six) hours as needed for mild pain, moderate pain or fever.    Marland Kitchen albuterol (VENTOLIN HFA) 108 (90 Base) MCG/ACT inhaler Inhale 1-2 puffs into the lungs every 6 (six) hours as needed for  wheezing or shortness of breath. 18 g 0  . apixaban (ELIQUIS) 5 MG TABS tablet Take 1 tablet (5 mg total) by mouth 2 (two) times daily. 180 tablet 1  . atorvastatin (LIPITOR) 40 MG tablet Take 40 mg by mouth daily.    Marland Kitchen azelastine (ASTELIN) 0.1 % nasal spray Place 1 spray into both nostrils 2 (two) times daily as needed for rhinitis or allergies. Use in each nostril as directed    . cetirizine (ZYRTEC) 10 MG tablet Take 10 mg by mouth daily as needed for allergies.     Marland Kitchen meclizine  (ANTIVERT) 12.5 MG tablet Take 12.5 mg by mouth 3 (three) times daily as needed for dizziness.    . metoprolol succinate (TOPROL-XL) 50 MG 24 hr tablet Take 1 tablet (50 mg total) by mouth daily. Take with or immediately following a meal. 30 tablet 0  . mirtazapine (REMERON) 7.5 MG tablet Take 7.5 mg by mouth at bedtime.    Marland Kitchen omeprazole (PRILOSEC) 20 MG capsule Take 20 mg by mouth daily as needed (stomach acid).    . ondansetron (ZOFRAN-ODT) 8 MG disintegrating tablet Take 8 mg by mouth every 8 (eight) hours as needed for nausea or vomiting.    . polyethylene glycol (MIRALAX) 17 g packet Take 17 g by mouth daily as needed for moderate constipation. 14 each 0  . potassium chloride 20 MEQ/15ML (10%) SOLN Take 15 mLs (20 mEq total) by mouth daily. 473 mL 5  . tamsulosin (FLOMAX) 0.4 MG CAPS capsule Take 1 capsule (0.4 mg total) by mouth daily after supper. 90 capsule 3  . furosemide (LASIX) 40 MG tablet Take 1 tablet (40 mg total) by mouth daily. 30 tablet 5   No current facility-administered medications for this visit.    Allergies:   Penicillins    Social History:  The patient  reports that he has quit smoking. He has never used smokeless tobacco. He reports that he does not drink alcohol and does not use drugs.   Family History:  The patient's family history includes Heart disease in his father.    ROS:  Please see the history of present illness.   Otherwise, review of systems are positive for none.   All other systems are reviewed and negative.    PHYSICAL EXAM: VS:  BP (!) 170/80 (BP Location: Left Arm, Patient Position: Sitting, Cuff Size: Normal)   Pulse 76   Ht 5\' 10"  (1.778 m)   Wt 192 lb 4 oz (87.2 kg)   SpO2 98%   BMI 27.59 kg/m  , BMI Body mass index is 27.59 kg/m. GEN: Well nourished, well developed, in no acute distress  HEENT: normal  Neck: no  carotid bruits, or masses.  Mild JVD Cardiac: RRR; no  rubs, or gallops.  No murmurs heard today.  He has moderate to severe  bilateral leg edema Respiratory:  clear to auscultation bilaterally, normal work of breathing GI: soft, nontender, nondistended, + BS MS: no deformity or atrophy  Skin: warm and dry, no rash Neuro:  Strength and sensation are intact Psych: euthymic mood, full affect   EKG:  EKG is  ordered today. EKG showed normal sinus rhythm with left axis deviation, LVH with repolarization abnormalities.   Recent Labs: No results found for requested labs within last 8760 hours.    Lipid Panel No results found for: CHOL, TRIG, HDL, CHOLHDL, VLDL, LDLCALC, LDLDIRECT    Wt Readings from Last 3 Encounters:  04/13/21 192 lb 4 oz (87.2  kg)  12/18/20 170 lb (77.1 kg)  09/29/20 180 lb 6 oz (81.8 kg)       No flowsheet data found.    ASSESSMENT AND PLAN:  1.  Status post TAVR for severe aortic stenosis: Most recent echo in October showed normal functioning TAVR prosthesis.   2.  Acute on chronic diastolic heart failure: He seems to be significantly volume overloaded with at least 12 pounds of weight gain.  He is only taking small dose of furosemide.  I elected to increase furosemide to 40 mg once daily and add potassium.  Check basic metabolic profile in 1 week.    3.  Essential hypertension: Blood pressure is elevated likely due to volume overload.  If blood pressure remains elevated upon follow-up, we can consider switching metoprolol to carvedilol or adding another antihypertensive medication.  Amlodipine might not be ideal due to chronic leg edema.  3.  Paroxysmal atrial fibrillation: He is in sinus rhythm.  He is on long-term anticoagulation with Eliquis.   Disposition:   FU with me in 3 months.  Signed,  [name], MD  04/14/2021 12:06 PM    [place]

## 2021-04-14 ENCOUNTER — Telehealth: Payer: Self-pay | Admitting: Cardiovascular Disease

## 2021-04-14 NOTE — Telephone Encounter (Signed)
Patient was given liquid potassium, now wants to change to pill form, Pharmacy [facility] [place]. Patient daughter called requesting it be done today.

## 2021-04-17 MED ORDER — POTASSIUM CHLORIDE CRYS ER 20 MEQ PO TBCR: 20.0000 meq | EXTENDED_RELEASE_TABLET | Freq: Every day | ORAL | 1 refills | Status: DC

## 2021-04-17 NOTE — Telephone Encounter (Signed)
Called and left a detailed VM for patients daughter Jacob Simpson that I sent in the Potassium in pill form as requested. Encouraged her to call back if she had any further questions or concerns.

## 2021-04-21 ENCOUNTER — Other Ambulatory Visit
Admission: RE | Admit: 2021-04-21 | Discharge: 2021-04-21 | Disposition: A | Discharge status: Home / Self Care | Payer: Medicare Other | Attending: Cardiovascular Disease | Admitting: Cardiovascular Disease

## 2021-04-21 DIAGNOSIS — I5033 Acute on chronic diastolic (congestive) heart failure: Secondary | ICD-10-CM | POA: Diagnosis present

## 2021-04-21 LAB — BASIC METABOLIC PANEL
Anion gap: 8 (ref 5–15)
BUN: 20 mg/dL (ref 8–23)
CO2: 23 mmol/L (ref 22–32)
Calcium: 8.8 mg/dL — ABNORMAL LOW (ref 8.9–10.3)
Chloride: 105 mmol/L (ref 98–111)
Creatinine, Ser: 1.47 mg/dL — ABNORMAL HIGH (ref 0.61–1.24)
GFR, Estimated: 45 mL/min — ABNORMAL LOW (ref >60)
Glucose, Bld: 193 mg/dL — ABNORMAL HIGH (ref 70–99)
Potassium: 4 mmol/L (ref 3.5–5.1)
Sodium: 136 mmol/L (ref 135–145)

## 2021-04-21 NOTE — Addendum Note (Signed)
Addended by: Bruce Donath, Alexi Geibel on: 04/21/2021 11:38 AM   Modules accepted: Orders

## 2021-04-26 ENCOUNTER — Other Ambulatory Visit: Payer: Self-pay | Admitting: *Deleted

## 2021-04-26 DIAGNOSIS — I5033 Acute on chronic diastolic (congestive) heart failure: Secondary | ICD-10-CM

## 2021-04-26 DIAGNOSIS — I480 Paroxysmal atrial fibrillation: Secondary | ICD-10-CM

## 2021-06-20 ENCOUNTER — Ambulatory Visit
Admission: EM | Admit: 2021-06-20 | Discharge: 2021-06-20 | Disposition: A | Discharge status: Home / Self Care | Payer: Medicare Other | Attending: Family Medicine | Admitting: Family Medicine

## 2021-06-20 ENCOUNTER — Other Ambulatory Visit: Payer: Self-pay

## 2021-06-20 ENCOUNTER — Ambulatory Visit (INDEPENDENT_AMBULATORY_CARE_PROVIDER_SITE_OTHER): Payer: Medicare Other

## 2021-06-20 DIAGNOSIS — R059 Cough, unspecified: Secondary | ICD-10-CM | POA: Diagnosis not present

## 2021-06-20 DIAGNOSIS — J441 Chronic obstructive pulmonary disease with (acute) exacerbation: Secondary | ICD-10-CM

## 2021-06-20 IMAGING — CR DG CHEST 2V
4 series · 4 of 4 positions shown · non-contrast
Comparison: [DATE]

CLINICAL DATA: Cough for 8 days.

EXAM:
CHEST - 2 VIEW

[chest pa (1 of 2)]
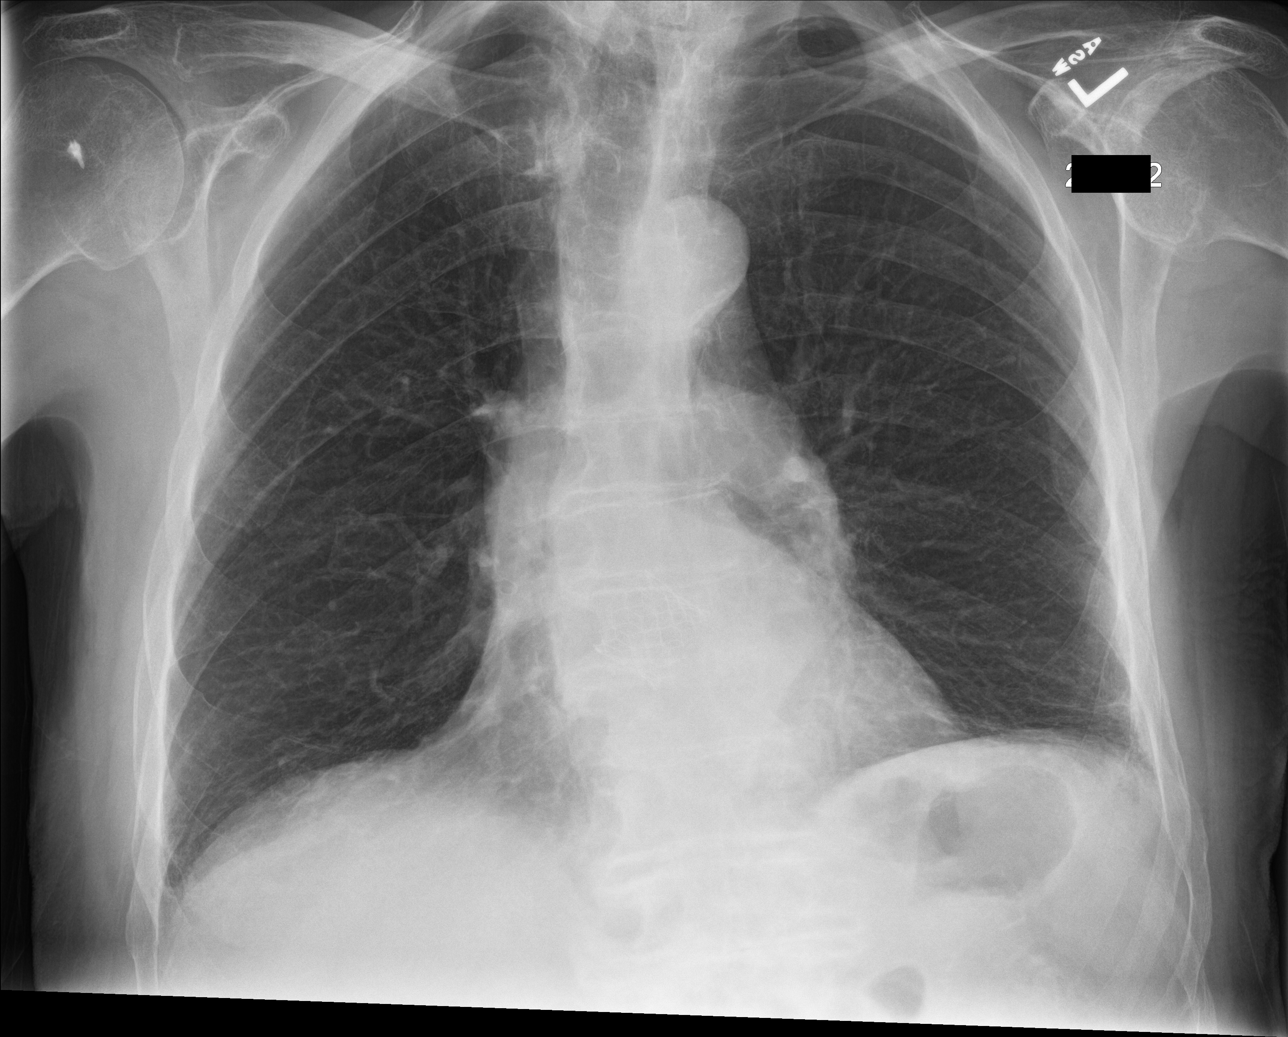

[chest lat (1 of 2)]
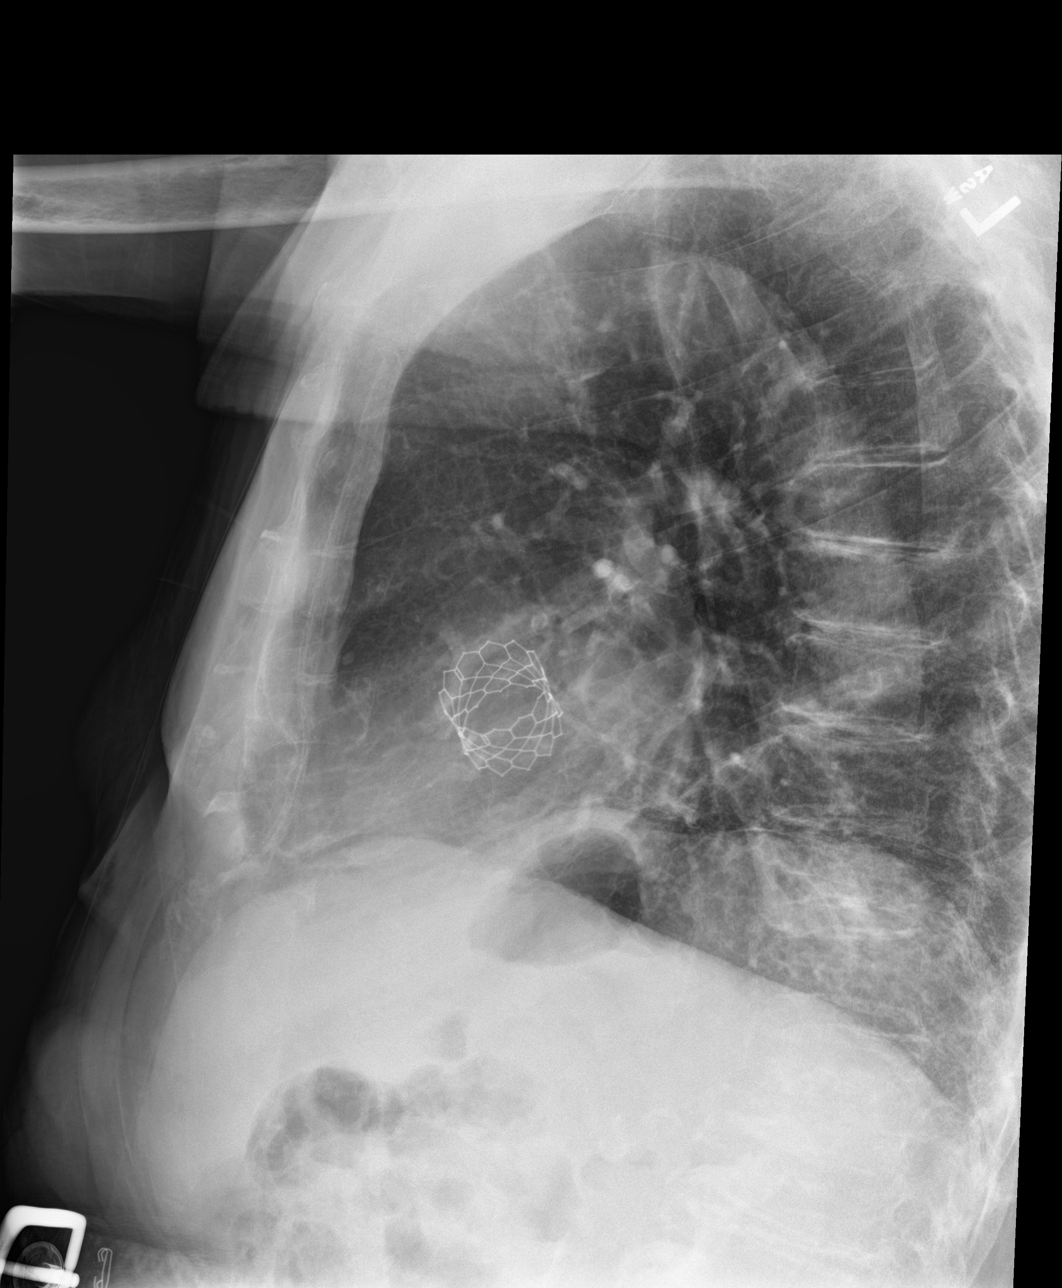

[chest lat (2 of 2)]
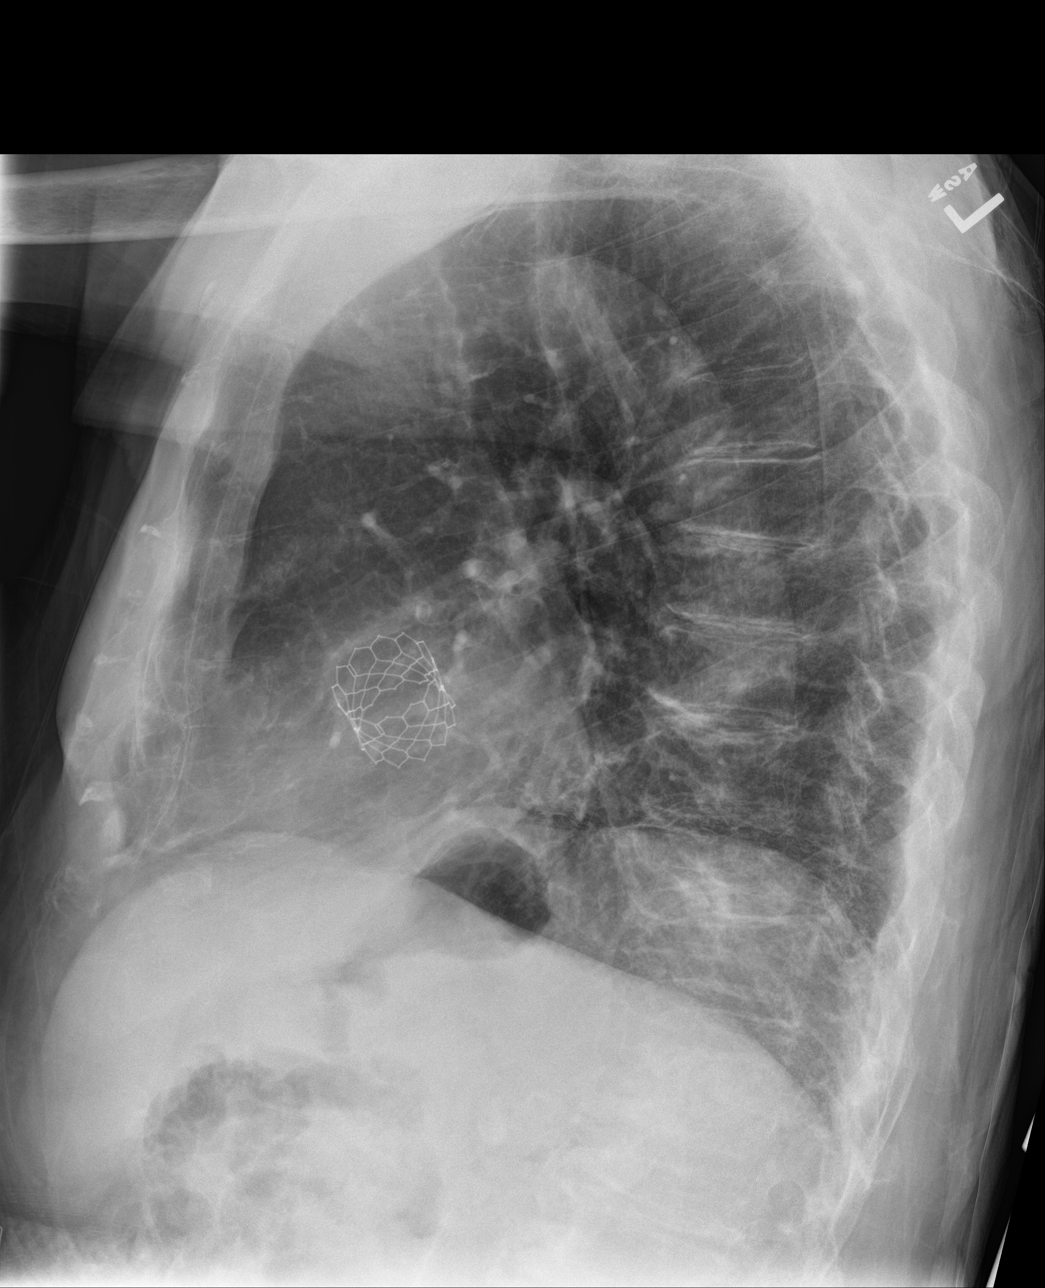

[chest pa (2 of 2)]
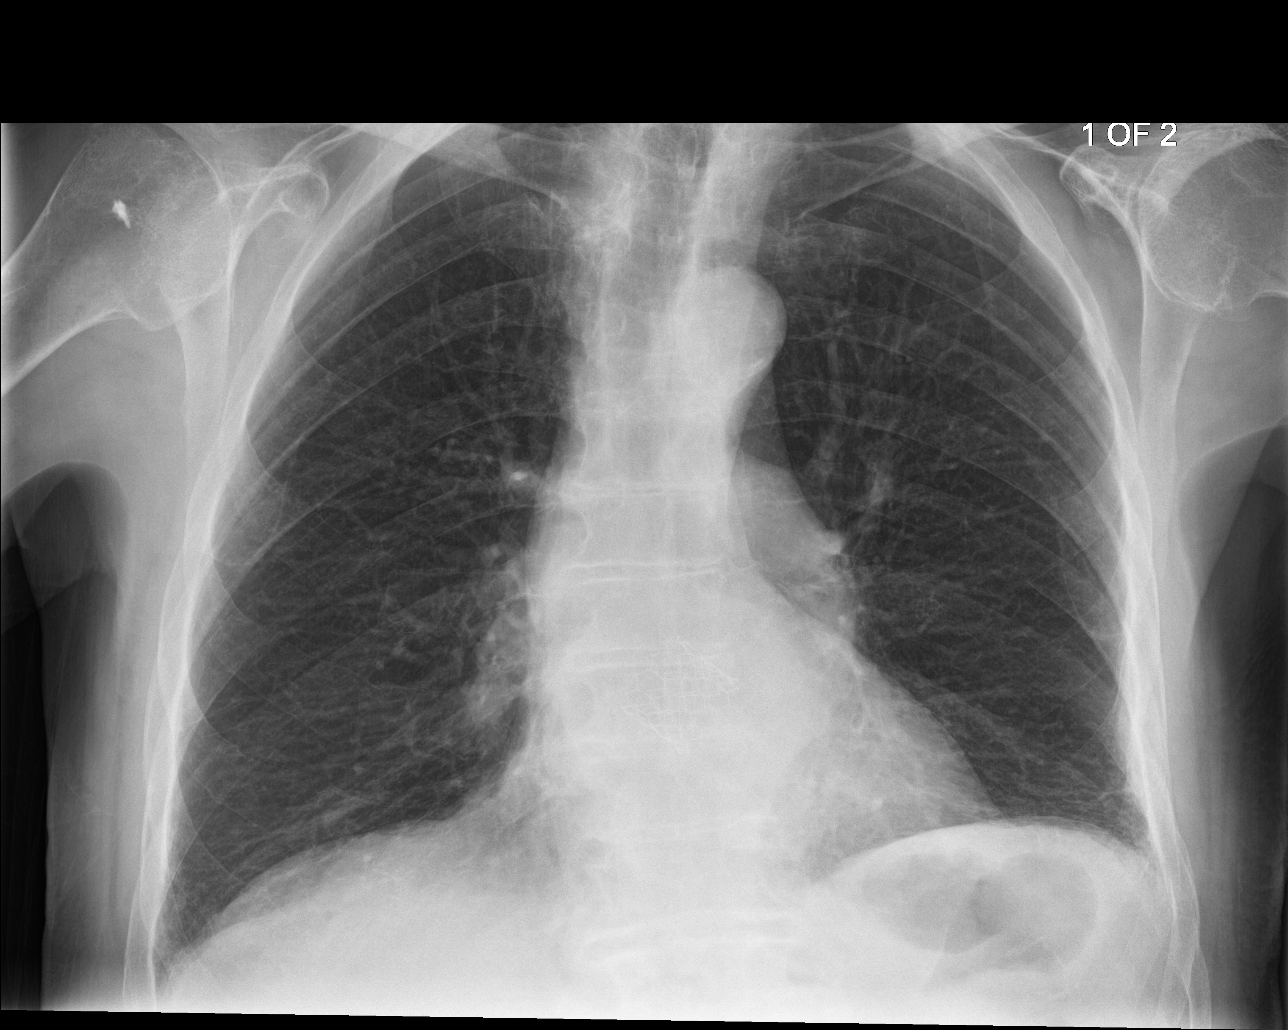

[4 of 4 positions shown; findings below may reference images not displayed]

FINDINGS: Prior TAVR. Normal heart size. Stable mediastinal contours with
aortic atherosclerosis and tortuosity. The lungs are hyperinflated,
chronic. Streaky bibasilar atelectasis or scarring. No confluent
airspace disease. No pulmonary edema, pleural effusion, or
pneumothorax. Scoliotic curvature of the spine without acute osseous
abnormalities.
IMPRESSION: 1. Chronic hyperinflation. Streaky bibasilar atelectasis or
scarring. No evidence that pneumonia.
2. Prior TAVR. Normal heart size.

## 2021-06-20 MED ORDER — DOXYCYCLINE HYCLATE 100 MG PO CAPS: 100.0000 mg | ORAL_CAPSULE | Freq: Two times a day (BID) | ORAL | 0 refills | Status: DC

## 2021-06-20 MED ORDER — PREDNISONE 50 MG PO TABS: ORAL_TABLET | ORAL | 0 refills | Status: DC

## 2021-06-20 NOTE — ED Triage Notes (Signed)
Pt c/o cough for about 8 days, along with a "rattling" in his chest. Pt's daughter is not sure of his last pulmonology visit, pt does have COPD. Pt reports possible low grade fevers and headache.

## 2021-06-21 NOTE — ED Provider Notes (Signed)
[facility]    CSN: [id number] Arrival date & time: 06/20/21  1711      History   Chief Complaint Chief Complaint  Patient presents with   Cough   HPI  87 year old male with an extensive past medical history including COPD presents with cough.  Cough x8 days.  He is having a "rattling" in his chest.  No increase shortness of breath.  Patient endorsing low-grade fever but has not checked his temperature.  Also reports headache.  No relieving factors.  No reported sick contacts.  No other complaints.  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 Burt Knack and Dr. 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   Radiculitis, lumbosacral    PAF (paroxysmal atrial fibrillation) (HCC)    Lymphoma (HCC)    Carotid artery disease ([place])    History of TIA (transient ischemic attack)    HTN (hypertension) 03/10/2019   Severe aortic stenosis 03/10/2019   Acute on chronic diastolic heart failure ([place]) 02/24/2019    Past Surgical History:  Procedure Laterality Date   BACK SURGERY  12/89   CATARACT EXTRACTION W/ INTRAOCULAR LENS IMPLANT Right 12/00   CATARACT EXTRACTION W/ INTRAOCULAR LENS IMPLANT Left 8/01   foot sugery Right 6/03   FOOT SURGERY Left 1/96   hammertoe   KIDNEY STONE SURGERY  7/90   RIGHT/LEFT HEART CATH AND CORONARY ANGIOGRAPHY N/A  08/10/2019   Procedure: RIGHT/LEFT HEART CATH AND CORONARY ANGIOGRAPHY;  Surgeon: Wellington Hampshire, MD;  Location: [place] [facility];  Service: Cardiovascular;  Laterality: N/A;   ROTATOR CUFF REPAIR Right 1/98   TEE WITHOUT CARDIOVERSION N/A 09/22/2019   Procedure: TRANSESOPHAGEAL ECHOCARDIOGRAM (TEE);  Surgeon: Sherren Mocha, MD;  Location: [place] [facility];  Service: Open Heart Surgery;  Laterality: N/A;   TOTAL KNEE ARTHROPLASTY Bilateral 1987   TRANSCATHETER AORTIC VALVE REPLACEMENT, TRANSFEMORAL N/A 09/22/2019   Procedure: TRANSCATHETER AORTIC VALVE REPLACEMENT, TRANSFEMORAL;  Surgeon: Sherren Mocha, MD;  Location: [place] [facility];  Service: Open Heart Surgery;  Laterality: N/A;       Home Medications    Prior to Admission medications   Medication Sig Start Date End Date Taking? Authorizing Provider  albuterol (VENTOLIN HFA) 108 (90 Base) MCG/ACT inhaler Inhale 1-2 puffs into the lungs every 6 (six) hours as needed for wheezing or shortness of breath. 12/18/20  Yes Amanie Mcculley G, DO  amLODipine (NORVASC) 10 MG tablet Take 1 tablet by mouth daily. 01/11/21 01/11/22 Yes [provider]  apixaban (ELIQUIS) 5 MG TABS tablet Take 1 tablet (5 mg total) by mouth 2 (two) times daily. 03/13/21  Yes Wellington Hampshire, MD  atorvastatin (LIPITOR) 40 MG tablet Take 40 mg by mouth daily.   Yes [provider]  azelastine (ASTELIN) 0.1 % nasal spray Place 1 spray into both nostrils 2 (two) times daily as needed for rhinitis or allergies. Use in each nostril as directed   Yes [provider]  doxycycline (VIBRAMYCIN) 100 MG capsule Take 1 capsule (100 mg total) by mouth 2 (two) times daily. 06/20/21  Yes Navarro Nine G, DO  furosemide (LASIX) 40 MG tablet Take 1 tablet (40 mg total) by mouth daily. 04/13/21  Yes Wellington Hampshire, MD  Ipratropium-Albuterol (COMBIVENT) 20-100 MCG/ACT AERS respimat Inhale into the lungs. 01/27/21  Yes [provider]  meclizine  (ANTIVERT) 12.5 MG tablet Take 12.5 mg by mouth 3 (three) times daily as needed for dizziness.   Yes [provider]  metoprolol succinate (TOPROL-XL) 50 MG 24 hr tablet Take 1 tablet (50 mg total) by mouth daily. Take with or immediately following a meal. 07/08/19  Yes Wieting, Richard, MD  mirtazapine (REMERON) 7.5 MG tablet Take 7.5 mg by mouth at bedtime.   Yes [provider]  omeprazole (PRILOSEC) 20 MG capsule Take 20 mg by mouth daily as needed (stomach acid).   Yes [provider]  ondansetron (ZOFRAN-ODT) 8 MG disintegrating tablet Take 8 mg by mouth every 8 (eight) hours as needed for nausea or vomiting.   Yes [provider]  predniSONE (DELTASONE) 50 MG tablet 1 tablet daily x 5 days 06/20/21  Yes Hisashi Amadon G, DO  tamsulosin (FLOMAX) 0.4 MG CAPS capsule Take 1 capsule (0.4 mg total) by mouth daily after supper. 08/04/19  Yes Billey Co, MD  acetaminophen (TYLENOL) 500 MG tablet Take 1,000 mg by mouth every 6 (six) hours as needed for mild pain, moderate pain or fever.    [provider]  cetirizine (ZYRTEC) 10 MG tablet Take 10 mg by mouth daily as needed for allergies.     [provider]  polyethylene glycol (MIRALAX) 17 g packet Take 17 g by mouth daily as needed for moderate constipation. 07/08/19   Loletha Grayer, MD  potassium chloride SA (KLOR-CON) 20 MEQ tablet Take 1 tablet (20 mEq total) by mouth daily. 04/17/21   Kate Sable, MD  Tiotropium Bromide Monohydrate (SPIRIVA RESPIMAT) 1.25 MCG/ACT AERS Inhale into the lungs. 01/27/21 01/27/22  [provider]    Family History Family History  Problem Relation Age of Onset   Heart disease Father     Social History Social History   Tobacco Use   Smoking status: Former    Pack years: 0.00   Smokeless tobacco: Never   Tobacco comments:    quit 1989  Vaping Use   Vaping Use: Never used  Substance Use Topics   Alcohol use: No   Drug use: No      Allergies   Penicillins   Review of Systems Review of Systems Per HPI  Physical Exam Triage Vital Signs ED Triage Vitals  Enc Vitals Group     BP 06/20/21 1906 (!) 183/97     Pulse Rate 06/20/21 1906 84     Resp 06/20/21 1906 18     Temp 06/20/21 1906 97.8 F (36.6 C)     Temp Source 06/20/21 1906 Oral     SpO2 06/20/21 1906 97 %     Weight 06/20/21 1903 170 lb (77.1 kg)     Height 06/20/21 1903 6' (1.829 m)     Head Circumference --      Peak Flow --      Pain Score 06/20/21 1903 0  Pain Loc --      Pain Edu? --      Excl. in [place]? --    No data found.  Updated Vital Signs BP (!) 183/97 (BP Location: Left Arm)   Pulse 84   Temp 97.8 F (36.6 C) (Oral)   Resp 18   Ht 6' (1.829 m)   Wt 77.1 kg   SpO2 97%   BMI 23.06 kg/m   Visual Acuity Right Eye Distance:   Left Eye Distance:   Bilateral Distance:    Right Eye Near:   Left Eye Near:    Bilateral Near:     Physical Exam Constitutional:      General: He is not in acute distress.    Appearance: Normal appearance. He is not ill-appearing.  HENT:     Head: Normocephalic and atraumatic.  Cardiovascular:     Rate and Rhythm: Normal rate and regular rhythm.  Pulmonary:     Effort: Pulmonary effort is normal.     Breath sounds: No wheezing or rales.  Neurological:     Mental Status: He is alert.  Psychiatric:        Mood and Affect: Mood normal.        Behavior: Behavior normal.     UC Treatments / Results  Labs (all labs ordered are listed, but only abnormal results are displayed) Labs Reviewed - No data to display  EKG   Radiology DG Chest 2 View  Result Date: 06/20/2021 CLINICAL DATA:  Cough for 8 days. EXAM: CHEST - 2 VIEW COMPARISON:  12/18/2020 FINDINGS: Prior TAVR. Normal heart size. Stable mediastinal contours with aortic atherosclerosis and tortuosity. The lungs are hyperinflated, chronic. Streaky bibasilar atelectasis or scarring. No confluent airspace disease. No pulmonary  edema, pleural effusion, or pneumothorax. Scoliotic curvature of the spine without acute osseous abnormalities. IMPRESSION: 1. Chronic hyperinflation. Streaky bibasilar atelectasis or scarring. No evidence that pneumonia. 2. Prior TAVR. Normal heart size. Electronically Signed   By: Keith Rake M.D.   On: 06/20/2021 19:52    Procedures Procedures (including critical care time)  Medications Ordered in UC Medications - No data to display  Initial Impression / Assessment and Plan / UC Course  I have reviewed the triage vital signs and the nursing notes.  Pertinent labs & imaging results that were available during my care of the patient were reviewed by me and considered in my medical decision making (see chart for details).    87 year old male presents with COPD exacerbation.  Chest x-ray obtained and was independently reviewed by me.  Interpretation: No evidence of pneumonia.  Hyperinflation consistent with COPD.  Treating with doxycycline and prednisone.  Final Clinical Impressions(s) / UC Diagnoses   Final diagnoses:  COPD exacerbation [facility])   Discharge Instructions   None    ED Prescriptions     Medication Sig Dispense Auth. Provider   doxycycline (VIBRAMYCIN) 100 MG capsule Take 1 capsule (100 mg total) by mouth 2 (two) times daily. 14 capsule Shalla Bulluck G, DO   predniSONE (DELTASONE) 50 MG tablet 1 tablet daily x 5 days 5 tablet Thersa Salt G, DO      PDMP not reviewed this encounter.   Coral Spikes, [place] 06/21/21 1713

## 2021-07-13 NOTE — Progress Notes (Signed)
Cardiology Office Note    Date:  07/14/2021   ID:  Jacob Simpson, DOB 1930-01-01, MRN [id number]  PCP:  Barbaraann Boys, MD  Cardiologist:  [name], MD  Electrophysiologist:  None   Chief Complaint: Follow-up  History of Present Illness:   Jacob Simpson is a 87 y.o. male with history of nonobstructive CAD by [place] in 07/2019, severe aortic stenosis status post TAVR in 09/2019, HFpEF, PAF on Eliquis, B-cell lymphoma in remission, CKD stage II-III, TIA, aortic aneurysm measuring 4.3 cm, HTN, pulmonary nodules related to infection/inflammation that have resolved by CT imaging in 06/2021, and prior tobacco use who presents for follow-up of acute on chronic HFpEF.  He has been monitored for progressive aortic stenosis over the years.  As part of this, diagnostic cardiac cath in 07/2019 showed minimal nonobstructive CAD.  He subsequently underwent TAVR in 09/2019.  Most recent echo from 09/2020 showed an EF of 55 to 123456, grade 1 diastolic dysfunction, moderate mitral regurgitation, normal functioning TAVR prosthesis with a mean gradient of 5.2 mmHg, and a mildly dilated ascending aorta measuring 40 mm.  He was last seen in the office in 03/2021 noting significant swelling of the bilateral lower extremities with a 12 pound weight gain.  He reported more shortness of breath.  He was taking 10 mg of furosemide as needed.  He was maintaining sinus rhythm.  His Lasix was titrated to 40 mg daily along with the addition of KCl.  He comes in accompanied by his daughter today and is doing well from a cardiac perspective.  Following the escalation of diuresis as outlined above, his weight is down 13 pounds today when compared to his clinic visit in 03/2021.  He remains on furosemide 40 mg daily with most recent renal function obtained at outside office appearing to be at his approximate baseline.  He does drink greater than 2 L of fluid per day, predominantly water.  He does try and watch his salt intake.  He  reports a longstanding history of bilateral lower extremity swelling which has been present since undergoing bilateral knee replacements in the remote past.  He notes with diuresis his dyspnea has improved.  No angina, palpitations, dizziness, presyncope, or syncope.  No abdominal distention or orthopnea.  No falls, hematochezia, or melena.   Labs independently reviewed: 05/2021 - potassium 3.7, BUN 25, serum creatinine 1.5, AST/ALT not elevated, albumin 4.2, Hgb 15.0, PLT 157 04/2021 - A1c 6.5 01/2021 - TC 178, TG 159, HDL 59, LDL 87, magnesium 2.2  Past Medical History:  Diagnosis Date   Aortic aneurysm (HCC)    Asthma    Carotid artery disease (HCC)    Chronic diastolic CHF (congestive heart failure) (HCC)    Diabetes mellitus ([place])    [place]    History of TIA (transient ischemic attack)    HTN (hypertension)    Lymphoma (HCC)    PAF (paroxysmal atrial fibrillation) (HCC)    Pneumonia    recurrent PNA   Radiculitis, lumbosacral    s/p spinal injections   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   s/p TAVR with Edwards with 29 mm Edwards Sapein via the TF approach with Dr. Burt Knack and Dr. Cyndia Bent   Severe aortic stenosis     Past Surgical History:  Procedure Laterality Date   BACK SURGERY  12/89   CATARACT EXTRACTION W/ INTRAOCULAR LENS IMPLANT Right 12/00   CATARACT EXTRACTION W/ INTRAOCULAR LENS IMPLANT Left 8/01   foot sugery Right  6/03   FOOT SURGERY Left 1/96   hammertoe   KIDNEY STONE SURGERY  7/90   RIGHT/LEFT HEART CATH AND CORONARY ANGIOGRAPHY N/A 08/10/2019   Procedure: RIGHT/LEFT HEART CATH AND CORONARY ANGIOGRAPHY;  Surgeon: Wellington Hampshire, MD;  Location: [place] [facility];  Service: Cardiovascular;  Laterality: N/A;   ROTATOR CUFF REPAIR Right 1/98   TEE WITHOUT CARDIOVERSION N/A 09/22/2019   Procedure: TRANSESOPHAGEAL ECHOCARDIOGRAM (TEE);  Surgeon: Sherren Mocha, MD;  Location: [place];  Service: Open Heart Surgery;   Laterality: N/A;   TOTAL KNEE ARTHROPLASTY Bilateral 1987   TRANSCATHETER AORTIC VALVE REPLACEMENT, TRANSFEMORAL N/A 09/22/2019   Procedure: TRANSCATHETER AORTIC VALVE REPLACEMENT, TRANSFEMORAL;  Surgeon: Sherren Mocha, MD;  Location: [place];  Service: Open Heart Surgery;  Laterality: N/A;    Current Medications: Current Meds  Medication Sig   acetaminophen (TYLENOL) 500 MG tablet Take 1,000 mg by mouth every 6 (six) hours as needed for mild pain, moderate pain or fever.   amLODipine (NORVASC) 5 MG tablet Take 1 tablet (5 mg total) by mouth daily.   apixaban (ELIQUIS) 5 MG TABS tablet Take 1 tablet (5 mg total) by mouth 2 (two) times daily.   atorvastatin (LIPITOR) 40 MG tablet Take 40 mg by mouth daily.   azelastine (ASTELIN) 0.1 % nasal spray Place 1 spray into both nostrils 2 (two) times daily as needed for rhinitis or allergies. Use in each nostril as directed   carvedilol (COREG) 25 MG tablet Take 1 tablet (25 mg total) by mouth 2 (two) times daily.   cetirizine (ZYRTEC) 10 MG tablet Take 10 mg by mouth daily as needed for allergies.    ergocalciferol (VITAMIN D2) 1.25 MG (50000 UT) capsule Take by mouth.   furosemide (LASIX) 40 MG tablet Take 1 tablet (40 mg total) by mouth daily.   meclizine (ANTIVERT) 12.5 MG tablet Take 12.5 mg by mouth 3 (three) times daily as needed for dizziness.   mirtazapine (REMERON) 15 MG tablet Take 15 mg by mouth at bedtime.   omeprazole (PRILOSEC) 20 MG capsule Take 20 mg by mouth daily as needed (stomach acid).   ondansetron (ZOFRAN-ODT) 8 MG disintegrating tablet Take 8 mg by mouth every 8 (eight) hours as needed for nausea or vomiting.   polyethylene glycol (MIRALAX) 17 g packet Take 17 g by mouth daily as needed for moderate constipation.   potassium chloride SA (KLOR-CON) 20 MEQ tablet Take 1 tablet (20 mEq total) by mouth daily.   senna-docusate (SENOKOT-S) 8.6-50 MG tablet Take by mouth.   tamsulosin (FLOMAX) 0.4 MG CAPS capsule Take 1  capsule (0.4 mg total) by mouth daily after supper.   [DISCONTINUED] amLODipine (NORVASC) 10 MG tablet Take 1 tablet by mouth daily.   [DISCONTINUED] metoprolol succinate (TOPROL-XL) 50 MG 24 hr tablet Take 1 tablet (50 mg total) by mouth daily. Take with or immediately following a meal.    Allergies:   Penicillins   Social History   Socioeconomic History   Marital status: Widowed    Spouse name: Not on file   Number of children: 5   Years of education: Not on file   Highest education level: Not on file  Occupational History   Occupation: Retired   Tobacco Use   Smoking status: Former   Smokeless tobacco: Never   Tobacco comments:    quit 1989  Vaping Use   Vaping Use: Never used  Substance and Sexual Activity   Alcohol use: No  Drug use: No   Sexual activity: Not on file  Other Topics Concern   Not on file  Social History Narrative   Not on file   Social Determinants of Health   Financial Resource Strain: Not on file  Food Insecurity: Not on file  Transportation Needs: Not on file  Physical Activity: Not on file  Stress: Not on file  Social Connections: Not on file     Family History:  The patient's family history includes Heart disease in his father.  ROS:   Review of Systems  Constitutional:  Positive for malaise/fatigue. Negative for chills, diaphoresis, fever and weight loss.  HENT:  Negative for congestion.   Eyes:  Negative for discharge and redness.  Respiratory:  Negative for cough, sputum production, shortness of breath and wheezing.   Cardiovascular:  Positive for leg swelling. Negative for chest pain, palpitations, orthopnea, claudication and PND.  Gastrointestinal:  Negative for abdominal pain, blood in stool, heartburn, melena, nausea and vomiting.  Musculoskeletal:  Negative for falls and myalgias.  Skin:  Negative for rash.  Neurological:  Negative for dizziness, tingling, tremors, sensory change, speech change, focal weakness, loss of  consciousness and weakness.  Endo/Heme/Allergies:  Does not bruise/bleed easily.  Psychiatric/Behavioral:  Negative for substance abuse. The patient is not nervous/anxious.   All other systems reviewed and are negative.   EKGs/Labs/Other Studies Reviewed:    Studies reviewed were summarized above. The additional studies were reviewed today:  2D echo 09/2020: 1. Left ventricular ejection fraction, by estimation, is 55 to 60%. The  left ventricle has normal function. The left ventricle has no regional  wall motion abnormalities. There is mild left ventricular hypertrophy.  Left ventricular diastolic parameters  are consistent with Grade I diastolic dysfunction (impaired relaxation).   2. Right ventricular systolic function is normal. The right ventricular  size is normal. There is normal pulmonary artery systolic pressure.   3. Left atrial size was mildly dilated.   4. Moderate mitral valve regurgitation.   5. The aortic valve has been repaired/replaced, s/p TAVR. Aortic valve  regurgitation is not visualized. no significant aortic stenosis. There is  a 29 mm Sapien prosthetic (TAVR) valve present in the aortic position.  Procedure Date: 09/22/19. Aortic  valve area, by VTI measures 2.80 cm. Aortic valve mean gradient measures  5.2 mmHg.   6. There is mild dilatation of the ascending aorta, measuring 40 mm. __________  2D echo 09/2019: 1. Left ventricular ejection fraction, by visual estimation, is 50 to  55%. The left ventricle has normal function. There is no left ventricular  hypertrophy.   2. Left ventricular diastolic parameters are consistent with Grade I  diastolic dysfunction (impaired relaxation).   3. Global right ventricle has normal systolic function.The right  ventricular size is normal. No increase in right ventricular wall  thickness.   4. Left atrial size was normal.   5. Right atrial size was moderately dilated.   6. The mitral valve is normal in structure.  Mild mitral valve  regurgitation. Mild mitral stenosis.   7. The tricuspid valve is normal in structure. Tricuspid valve  regurgitation is trivial.   8. Aortic valve regurgitation is not visualized.   9. The pulmonic valve was grossly normal. Pulmonic valve regurgitation is  not visualized.  10. Normal pulmonary artery systolic pressure.  11. The atrial septum is grossly normal. __________  [facility] 07/2019: 1.  Minimal nonobstructive coronary artery disease. 2.  Severe aortic stenosis by echo.  I did not  attempt to cross the aortic valve which was noted to be heavily calcified with restricted opening on fluoroscopy. 3.  Right heart catheterization showed severely elevated filling pressures, severe pulmonary hypertension and mildly reduced cardiac output.  Pulmonary wedge pressure was 33 mmHg with prominent V waves suggestive of mitral regurgitation.  PA pressure was 74/30 with a mean of 50 mmHg.  Cardiac output was 4.15 with a cardiac index of 2.09.   Recommendations: I am going to resume furosemide and increase the dose to 40 mg once daily. Continue evaluation for TAVR. Given underlying chronic kidney disease and heart failure, consider hospital admission before TAVR for diuresis and monitoring of renal function.   EKG:  EKG is ordered today.  The EKG ordered today demonstrates NSR, 85 bpm, occasional PACs, left axis deviation, incomplete RBBB, LVH with early repolarization abnormality, no acute ST-T changes  Recent Labs: 04/21/2021: BUN 20; Creatinine, Ser 1.47; Potassium 4.0; Sodium 136  Recent Lipid Panel No results found for: CHOL, TRIG, HDL, CHOLHDL, VLDL, LDLCALC, LDLDIRECT  PHYSICAL EXAM:    VS:  BP (!) 164/84 (BP Location: Left Arm, Patient Position: Sitting, Cuff Size: Normal)   Pulse 85   Ht 6' (1.829 m)   Wt 179 lb (81.2 kg)   SpO2 97%   BMI 24.28 kg/m   BMI: Body mass index is 24.28 kg/m.  Physical Exam Vitals reviewed.  Constitutional:      Appearance: He is  well-developed.  HENT:     Head: Normocephalic and atraumatic.  Eyes:     General:        Right eye: No discharge.        Left eye: No discharge.  Neck:     Vascular: No JVD.  Cardiovascular:     Rate and Rhythm: Normal rate and regular rhythm. Occasional Extrasystoles are present.    Pulses:          Posterior tibial pulses are 2+ on the right side and 2+ on the left side.     Heart sounds: Normal heart sounds, S1 normal and S2 normal. Heart sounds not distant. No midsystolic click and no opening snap. No murmur heard.   No friction rub.  Pulmonary:     Effort: Pulmonary effort is normal. No respiratory distress.     Breath sounds: Normal breath sounds. No decreased breath sounds, wheezing or rales.  Chest:     Chest wall: No tenderness.  Abdominal:     General: There is no distension.     Palpations: Abdomen is soft.     Tenderness: There is no abdominal tenderness.  Musculoskeletal:     Cervical back: Normal range of motion.     Right lower leg: Edema present.     Left lower leg: Edema present.     Comments: 1+ bilateral pretibial edema with compression socks in place  Skin:    General: Skin is warm and dry.     Nails: There is no clubbing.  Neurological:     Mental Status: He is alert and oriented to person, place, and time.  Psychiatric:        Speech: Speech normal.        Behavior: Behavior normal.        Thought Content: Thought content normal.        Judgment: Judgment normal.    Wt Readings from Last 3 Encounters:  07/14/21 179 lb (81.2 kg)  06/20/21 170 lb (77.1 kg)  04/13/21 192 lb 4 oz (87.2 kg)  ASSESSMENT & PLAN:   Chronic HFpEF/lower extremity swelling: Volume status is much improved.  He appears euvolemic and well compensated.  He does remain on Lasix 40 mg daily along with KCl 20 mEq daily.  We will check a BMP today.  He does report a longstanding history of bilateral lower extremity swelling which she has attributed to a history of prior knee  replacements.  Cannot exclude some degree of lower extremity swelling exacerbated by amlodipine use.  In this setting, we will work to attempt to discontinue this medication by decreasing his amlodipine to 5 mg daily with potential complete discontinuation at his next visit.  Aortic stenosis status post TAVR: Most recent echo in 09/2020 showed normal functioning TAVR prosthesis.  PAF: Maintaining sinus rhythm.  We will transition him from Toprol-XL to carvedilol given persistent hypertension as outlined below.  CHA2DS2-VASc at least 7 (CHF, HTN, age x2, TIA x2, vascular disease).  He remains on apixaban 5 mg twice daily.  We will update a BMP today to trend his serum creatinine as well as to calculate his updated creatinine clearance with further recommendations regarding potential dose adjustment to his Eliquis versus transitioning to Xarelto based on creatinine clearance.  Recent CBC showed normal hemoglobin.  No falls or symptoms of bleeding.  HTN: Blood pressure remains suboptimally controlled though is mildly improved when compared to prior reading.  As above, we are tapering his amlodipine to 5 mg daily in an effort to improve his lower extremity swelling.  We are also discontinuing metoprolol and transitioning him to carvedilol 25 mg twice daily, which is a titrated dose of beta-blocker to account for the decreased dose of amlodipine.  CKD stage II-III: It appears his renal function may run around 1.3-1.5 most recently.  Check BMP.  Disposition: F/u with Dr. Fletcher Anon or an APP in 2 months.   Medication Adjustments/Labs and Tests Ordered: Current medicines are reviewed at length with the patient today.  Concerns regarding medicines are outlined above. Medication changes, Labs and Tests ordered today are summarized above and listed in the Patient Instructions accessible in Encounters.   SignedChristell Faith, PA-C 07/14/2021 1:15 PM     [address] [place], [place] [address] [phone]

## 2021-07-14 ENCOUNTER — Encounter: Payer: Self-pay | Admitting: Physician Assistant

## 2021-07-14 ENCOUNTER — Ambulatory Visit (INDEPENDENT_AMBULATORY_CARE_PROVIDER_SITE_OTHER): Payer: Medicare Other | Admitting: Physician Assistant

## 2021-07-14 ENCOUNTER — Other Ambulatory Visit: Payer: Self-pay

## 2021-07-14 VITALS — BP 164/84 | HR 85 | Ht 72.0 in | Wt 179.0 lb

## 2021-07-14 DIAGNOSIS — I359 Nonrheumatic aortic valve disorder, unspecified: Secondary | ICD-10-CM

## 2021-07-14 DIAGNOSIS — Z952 Presence of prosthetic heart valve: Secondary | ICD-10-CM

## 2021-07-14 DIAGNOSIS — I5033 Acute on chronic diastolic (congestive) heart failure: Secondary | ICD-10-CM | POA: Diagnosis not present

## 2021-07-14 DIAGNOSIS — I10 Essential (primary) hypertension: Secondary | ICD-10-CM

## 2021-07-14 DIAGNOSIS — I480 Paroxysmal atrial fibrillation: Secondary | ICD-10-CM

## 2021-07-14 DIAGNOSIS — M7989 Other specified soft tissue disorders: Secondary | ICD-10-CM | POA: Diagnosis not present

## 2021-07-14 DIAGNOSIS — N182 Chronic kidney disease, stage 2 (mild): Secondary | ICD-10-CM

## 2021-07-14 DIAGNOSIS — I5032 Chronic diastolic (congestive) heart failure: Secondary | ICD-10-CM | POA: Diagnosis not present

## 2021-07-14 MED ORDER — CARVEDILOL 25 MG PO TABS: 25.0000 mg | ORAL_TABLET | Freq: Two times a day (BID) | ORAL | 3 refills | Status: DC

## 2021-07-14 MED ORDER — AMLODIPINE BESYLATE 5 MG PO TABS: 5.0000 mg | ORAL_TABLET | Freq: Every day | ORAL | 3 refills | Status: DC

## 2021-07-14 NOTE — Patient Instructions (Addendum)
Medication Instructions:  Your physician has recommended you make the following change in your medication:   STOP Metoprolol succinate (Toprol) START Carvedilol (Coreg) 25 mg twice a day DECREASE Amlodipine to 5 mg once a day  *If you need a refill on your cardiac medications before your next appointment, please call your pharmacy*   Lab Work: BMET today  If you have labs (blood work) drawn today and your tests are completely normal, you will receive your results only by: [place] (if you have MyChart) OR A paper copy in the mail If you have any lab test that is abnormal or we need to change your treatment, we will call you to review the results.   Testing/Procedures: None   Follow-Up: At [facility], you and your health needs are our priority.  As part of our continuing mission to provide you with exceptional heart care, we have created designated Provider Care Teams.  These Care Teams include your primary Cardiologist (physician) and Advanced Practice Providers (APPs -  Physician Assistants and Nurse Practitioners) who all work together to provide you with the care you need, when you need it.   Your next appointment:   2 month(s)  The format for your next appointment:   In Person  Provider:   [name], MD or Christell Faith, PA-C

## 2021-07-15 LAB — BASIC METABOLIC PANEL
BUN/Creatinine Ratio: 10 (ref 10–24)
BUN: 14 mg/dL (ref 8–27)
CO2: 22 mmol/L (ref 20–29)
Calcium: 9.1 mg/dL (ref 8.6–10.2)
Chloride: 101 mmol/L (ref 96–106)
Creatinine, Ser: 1.34 mg/dL — ABNORMAL HIGH (ref 0.76–1.27)
Glucose: 107 mg/dL — ABNORMAL HIGH (ref 65–99)
Potassium: 3.9 mmol/L (ref 3.5–5.2)
Sodium: 137 mmol/L (ref 134–144)
eGFR: 51 mL/min/{1.73_m2} — ABNORMAL LOW (ref >59)

## 2021-07-17 ENCOUNTER — Telehealth: Payer: Self-pay | Admitting: *Deleted

## 2021-07-17 MED ORDER — FUROSEMIDE 40 MG PO TABS: 20.0000 mg | ORAL_TABLET | Freq: Every day | ORAL | 5 refills | Status: DC

## 2021-07-17 NOTE — Telephone Encounter (Signed)
-----   Message from Rise Mu, PA-C sent at 07/17/2021  9:06 AM EDT ----- Random glucose is okay with known diabetes Renal function remains mildly elevated though is consistent with baseline Potassium normal  Recommendations: -With mild renal dysfunction, and significant improvement in volume status at his last visit, decrease furosemide to 20 mg daily -Stop KCl -Follow-up as planned to reassess volume status and renal function on a lower dose Lasix

## 2021-07-17 NOTE — Telephone Encounter (Signed)
Reviewed results and recommendations with patients daughter per release form. Instructed her to decrease Furosemide to 20 mg once daily, STOP potassium, and keep follow up appointment. She verbalized understanding, read back instructions, and had no further questions at this time.

## 2021-09-26 ENCOUNTER — Ambulatory Visit: Payer: Medicare Other | Admitting: Physician Assistant

## 2021-10-14 NOTE — Progress Notes (Signed)
Cardiology Office Note    Date:  10/17/2021   ID:  CORDARRYL MONRREAL, DOB 20-Mar-1930, MRN [id number]  PCP:  Barbaraann Boys, MD  Cardiologist:  [name], MD  Electrophysiologist:  None   Chief Complaint: Follow-up  History of Present Illness:   Jacob Simpson is a 87 y.o. male with history of nonobstructive CAD by [place] in 07/2019, severe aortic stenosis status post TAVR in 09/2019, HFpEF, PAF on Eliquis, B-cell lymphoma in remission, CKD stage II-III, TIA, aortic aneurysm measuring 4.3 cm, HTN, pulmonary nodules related to infection/inflammation that have resolved by CT imaging in 06/2021, and prior tobacco use who presents for follow-up of acute on chronic HFpEF.   He has been monitored for progressive aortic stenosis over the years.  As part of this, diagnostic cardiac cath in 07/2019 showed minimal nonobstructive CAD.  He subsequently underwent TAVR in 09/2019.  Most recent echo from 09/2020 showed an EF of 55 to 69%, grade 1 diastolic dysfunction, moderate mitral regurgitation, normal functioning TAVR prosthesis with a mean gradient of 5.2 mmHg, and a mildly dilated ascending aorta measuring 40 mm.  He was seen in the office in 03/2021 noting significant swelling of the bilateral lower extremities with a 12 pound weight gain.  He reported more shortness of breath.  He was taking 10 mg of furosemide as needed.  He was maintaining sinus rhythm.  His Lasix was titrated to 40 mg daily along with the addition of KCl.  He was last seen in the office in 06/2021 and was doing well from a cardiac perspective.  Following the escalation of diuresis, as outlined above, his weight was down 13 pounds when compared to his prior clinic visit.  He remained on furosemide 40 mg daily with stable renal function noted at that time.  He was drinking greater than 2 L of fluid per day.  He did report some longstanding history of bilateral lower extremity swelling following knee replacements in the remote past.  With  diuresis, his dyspnea had improved.  His amlodipine was decreased to 5 mg, metoprolol was discontinued, and he was transitioned to carvedilol.  He was seen by his PCP on 08/17/2021 with a cough that was different than his longstanding cough with associated nasal congestion.  By their scale, his weight was up 6 pounds when compared to his visit 2 months prior.  proBNP was normal.  Treated with inhalers.  He comes in accompanied by his daughter today and is doing very well from a cardiac perspective.  No chest pain, dyspnea, palpitations, dizziness, presyncope, or syncope.  He remains on Lasix 20 mg daily, though has been out for the past couple of days.  His weight is down 4 pounds when compared to his last clinic visit.  He does watch his salt intake.  He does continue to drink large amounts of fluids.  With lower dose amlodipine, his lower extremity swelling has improved some.  He has tolerated the transition from metoprolol to carvedilol without issues.     Labs independently reviewed: 08/2021 - potassium 3.8, BUN 17, serum creatinine 1.5, albumin 3.8, AST/ALT not elevated, Hgb 14.0, PLT 151 04/2021 - A1c 6.5 01/2021 - TC 178, TG 159, HDL 59, LDL 87, magnesium 2.2    Past Medical History:  Diagnosis Date   Aortic aneurysm (HCC)    Asthma    Carotid artery disease (HCC)    Chronic diastolic CHF (congestive heart failure) ([place])    Diabetes mellitus ([place])  [place]    History of TIA (transient ischemic attack)    HTN (hypertension)    Lymphoma (HCC)    PAF (paroxysmal atrial fibrillation) (HCC)    Pneumonia    recurrent PNA   Radiculitis, lumbosacral    s/p spinal injections   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 Burt Knack and Dr. Cyndia Bent   Severe aortic stenosis     Past Surgical History:  Procedure Laterality Date   BACK SURGERY  12/89   CATARACT EXTRACTION W/ INTRAOCULAR LENS  IMPLANT Right 12/00   CATARACT EXTRACTION W/ INTRAOCULAR LENS IMPLANT Left 8/01   foot sugery Right 6/03   FOOT SURGERY Left 1/96   [place]   KIDNEY STONE SURGERY  7/90   RIGHT/LEFT HEART CATH AND CORONARY ANGIOGRAPHY N/A 08/10/2019   Procedure: RIGHT/LEFT HEART CATH AND CORONARY ANGIOGRAPHY;  Surgeon: Wellington Hampshire, MD;  Location: [facility];  Service: Cardiovascular;  Laterality: N/A;   ROTATOR CUFF REPAIR Right 1/98   TEE WITHOUT CARDIOVERSION N/A 09/22/2019   Procedure: TRANSESOPHAGEAL ECHOCARDIOGRAM (TEE);  Surgeon: Sherren Mocha, MD;  Location: [facility];  Service: Open Heart Surgery;  Laterality: N/A;   TOTAL KNEE ARTHROPLASTY Bilateral 1987   TRANSCATHETER AORTIC VALVE REPLACEMENT, TRANSFEMORAL N/A 09/22/2019   Procedure: TRANSCATHETER AORTIC VALVE REPLACEMENT, TRANSFEMORAL;  Surgeon: Sherren Mocha, MD;  Location: [facility];  Service: Open Heart Surgery;  Laterality: N/A;    Current Medications: Current Meds  Medication Sig   acetaminophen (TYLENOL) 500 MG tablet Take 1,000 mg by mouth every 6 (six) hours as needed for mild pain, moderate pain or fever.   amLODipine (NORVASC) 5 MG tablet Take 1 tablet (5 mg total) by mouth daily.   apixaban (ELIQUIS) 5 MG TABS tablet Take 1 tablet (5 mg total) by mouth 2 (two) times daily.   atorvastatin (LIPITOR) 40 MG tablet Take 40 mg by mouth daily.   azelastine (ASTELIN) 0.1 % nasal spray Place 1 spray into both nostrils 2 (two) times daily as needed for rhinitis or allergies. Use in each nostril as directed   carvedilol (COREG) 25 MG tablet Take 1 tablet (25 mg total) by mouth 2 (two) times daily.   cetirizine (ZYRTEC) 10 MG tablet Take 10 mg by mouth daily as needed for allergies.    diphenhydrAMINE (BENADRYL) 25 MG tablet Take 25 mg by mouth every morning.   furosemide (LASIX) 20 MG tablet Take 1 tablet (20 mg total) by mouth as directed. Take one tablet every other day with an extra pill as needed for  shortness of breath.   meclizine (ANTIVERT) 12.5 MG tablet Take 12.5 mg by mouth 3 (three) times daily as needed for dizziness.   mirtazapine (REMERON) 15 MG tablet Take 15 mg by mouth at bedtime.   omeprazole (PRILOSEC) 20 MG capsule Take 20 mg by mouth daily as needed (stomach acid).   ondansetron (ZOFRAN-ODT) 8 MG disintegrating tablet Take 8 mg by mouth every 8 (eight) hours as needed for nausea or vomiting.   polyethylene glycol (MIRALAX) 17 g packet Take 17 g by mouth daily as needed for moderate constipation.   senna-docusate (SENOKOT-S) 8.6-50 MG tablet Take by mouth as needed.   tamsulosin (FLOMAX) 0.4 MG CAPS capsule Take 1 capsule (0.4 mg total) by mouth daily after supper.   [DISCONTINUED] furosemide (LASIX) 40 MG tablet Take 0.5 tablets (20 mg total) by mouth  daily.    Allergies:   Penicillins   Social History   Socioeconomic History   Marital status: Widowed    Spouse name: Not on file   Number of children: 5   Years of education: Not on file   Highest education level: Not on file  Occupational History   Occupation: Retired   Tobacco Use   Smoking status: Former   Smokeless tobacco: Never   Tobacco comments:    quit 1989  Vaping Use   Vaping Use: Never used  Substance and Sexual Activity   Alcohol use: No   Drug use: No   Sexual activity: Not on file  Other Topics Concern   Not on file  Social History Narrative   Not on file   Social Determinants of Health   Financial Resource Strain: Not on file  Food Insecurity: Not on file  Transportation Needs: Not on file  Physical Activity: Not on file  Stress: Not on file  Social Connections: Not on file     Family History:  The patient's family history includes Heart disease in his father.  ROS:   Review of Systems  Constitutional:  Positive for malaise/fatigue. Negative for chills, diaphoresis, fever and weight loss.  HENT:  Negative for congestion.   Eyes:  Negative for discharge and redness.   Respiratory:  Negative for cough, sputum production, shortness of breath and wheezing.   Cardiovascular:  Positive for leg swelling. Negative for chest pain, palpitations, orthopnea, claudication and PND.  Gastrointestinal:  Negative for abdominal pain, blood in stool, heartburn, melena, nausea and vomiting.  Musculoskeletal:  Negative for falls and myalgias.  Skin:  Negative for rash.  Neurological:  Negative for dizziness, tingling, tremors, sensory change, speech change, focal weakness, loss of consciousness and weakness.  Endo/Heme/Allergies:  Does not bruise/bleed easily.  Psychiatric/Behavioral:  Negative for substance abuse. The patient is not nervous/anxious.   All other systems reviewed and are negative.   EKGs/Labs/Other Studies Reviewed:    Studies reviewed were summarized above. The additional studies were reviewed today:  2D echo 09/2020: 1. Left ventricular ejection fraction, by estimation, is 55 to 60%. The  left ventricle has normal function. The left ventricle has no regional  wall motion abnormalities. There is mild left ventricular hypertrophy.  Left ventricular diastolic parameters  are consistent with Grade I diastolic dysfunction (impaired relaxation).   2. Right ventricular systolic function is normal. The right ventricular  size is normal. There is normal pulmonary artery systolic pressure.   3. Left atrial size was mildly dilated.   4. Moderate mitral valve regurgitation.   5. The aortic valve has been repaired/replaced, s/p TAVR. Aortic valve  regurgitation is not visualized. no significant aortic stenosis. There is  a 29 mm Sapien prosthetic (TAVR) valve present in the aortic position.  Procedure Date: 09/22/19. Aortic  valve area, by VTI measures 2.80 cm. Aortic valve mean gradient measures  5.2 mmHg.   6. There is mild dilatation of the ascending aorta, measuring 40 mm. __________   2D echo 09/2019: 1. Left ventricular ejection fraction, by visual  estimation, is 50 to  55%. The left ventricle has normal function. There is no left ventricular  hypertrophy.   2. Left ventricular diastolic parameters are consistent with Grade I  diastolic dysfunction (impaired relaxation).   3. Global right ventricle has normal systolic function.The right  ventricular size is normal. No increase in right ventricular wall  thickness.   4. Left atrial size was normal.  5. Right atrial size was moderately dilated.   6. The mitral valve is normal in structure. Mild mitral valve  regurgitation. Mild mitral stenosis.   7. The tricuspid valve is normal in structure. Tricuspid valve  regurgitation is trivial.   8. Aortic valve regurgitation is not visualized.   9. The pulmonic valve was grossly normal. Pulmonic valve regurgitation is  not visualized.  10. Normal pulmonary artery systolic pressure.  11. The atrial septum is grossly normal. __________   [facility] 07/2019: 1.  Minimal nonobstructive coronary artery disease. 2.  Severe aortic stenosis by echo.  I did not attempt to cross the aortic valve which was noted to be heavily calcified with restricted opening on fluoroscopy. 3.  Right heart catheterization showed severely elevated filling pressures, severe pulmonary hypertension and mildly reduced cardiac output.  Pulmonary wedge pressure was 33 mmHg with prominent V waves suggestive of mitral regurgitation.  PA pressure was 74/30 with a mean of 50 mmHg.  Cardiac output was 4.15 with a cardiac index of 2.09.   Recommendations: I am going to resume furosemide and increase the dose to 40 mg once daily. Continue evaluation for TAVR. Given underlying chronic kidney disease and heart failure, consider hospital admission before TAVR for diuresis and monitoring of renal function.   EKG:  EKG is ordered today.  The EKG ordered today demonstrates NSR, 78 bpm, left axis deviation, incomplete RBBB, LVH with early repolarization abnormality, when compared to prior  tracing no significant changes  Recent Labs: 07/14/2021: BUN 14; Creatinine, Ser 1.34; Potassium 3.9; Sodium 137  Recent Lipid Panel No results found for: CHOL, TRIG, HDL, CHOLHDL, VLDL, LDLCALC, LDLDIRECT  PHYSICAL EXAM:    VS:  BP 134/86 (BP Location: Right Arm, Patient Position: Sitting, Cuff Size: Large)   Pulse 78   Ht 6' (1.829 m)   Wt 175 lb (79.4 kg)   SpO2 98%   BMI 23.73 kg/m   BMI: Body mass index is 23.73 kg/m.  Physical Exam Vitals reviewed.  Constitutional:      Appearance: He is well-developed.  HENT:     Head: Normocephalic and atraumatic.  Eyes:     General:        Right eye: No discharge.        Left eye: No discharge.  Neck:     Vascular: No JVD.  Cardiovascular:     Rate and Rhythm: Normal rate and regular rhythm.     Pulses:          Posterior tibial pulses are 2+ on the right side and 2+ on the left side.     Heart sounds: Normal heart sounds, S1 normal and S2 normal. Heart sounds not distant. No midsystolic click and no opening snap. No murmur heard.   No friction rub.  Pulmonary:     Effort: Pulmonary effort is normal. No respiratory distress.     Breath sounds: Normal breath sounds. No decreased breath sounds, wheezing or rales.  Chest:     Chest wall: No tenderness.  Abdominal:     General: There is no distension.     Palpations: Abdomen is soft.     Tenderness: There is no abdominal tenderness.  Musculoskeletal:     Cervical back: Normal range of motion.     Right lower leg: Edema present.     Left lower leg: Edema present.     Comments: 1+ bilateral lower extremity swelling  Skin:    General: Skin is warm and dry.  Nails: There is no clubbing.  Neurological:     Mental Status: He is alert and oriented to person, place, and time.  Psychiatric:        Speech: Speech normal.        Behavior: Behavior normal.        Thought Content: Thought content normal.        Judgment: Judgment normal.    Wt Readings from Last 3 Encounters:   10/17/21 175 lb (79.4 kg)  07/14/21 179 lb (81.2 kg)  06/20/21 170 lb (77.1 kg)     ASSESSMENT & PLAN:   HFpEF/lower extremity swelling: He appears euvolemic and well compensated.  His weight is down another 4 pounds today by our scale to 175 pounds.  In an effort to minimize overdiuresis, we will transition his Lasix to 20 mg every other day with an additional 20 mg as needed.  He does have a history of longstanding bilateral lower extremity swelling which is likely multifactorial including prior TKA bilaterally, venous insufficiency, and calcium channel blocker usage.  With lower dose amlodipine, his lower extremity swelling is improved on exam today.  Recommend leg elevation.  Could consider discontinuing amlodipine in follow-up.  Aortic stenosis status post TAVR: Normal functioning TAVR prosthesis on most recent echo.  PAF: Maintaining sinus rhythm.  Continue carvedilol in place of metoprolol given significantly improved blood pressures.  CHA2DS2-VASc at least 7 (CHF, HTN, age x2, TIA x2, vascular disease).  He remains on apixaban and does not meet reduced dosing criteria at this time.  If his serum creatinine trends to greater than 1.5 consistently, may need to consider transitioning to Xarelto.  Recent CBC demonstrated normal hemoglobin.  No falls or symptoms of bleeding.  HTN: Blood pressure is well controlled in the office today.  Continue amlodipine, carvedilol, and furosemide as outlined above.  CKD stage II-III: Largely stable on most recent check.  Torsemide was decreased in frequency as outlined above in an effort to avoid overdiuresis and dehydration/AKI.   Influenza vaccine administered at his request.  Disposition: F/u with Dr. Fletcher Anon or an APP in 3 months.   Medication Adjustments/Labs and Tests Ordered: Current medicines are reviewed at length with the patient today.  Concerns regarding medicines are outlined above. Medication changes, Labs and Tests ordered today are  summarized above and listed in the Patient Instructions accessible in Encounters.   Signed, Christell Faith, PA-C 10/17/2021 12:27 PM     [address], [place] [address] [phone]

## 2021-10-17 ENCOUNTER — Ambulatory Visit (INDEPENDENT_AMBULATORY_CARE_PROVIDER_SITE_OTHER): Payer: Medicare Other | Admitting: Physician Assistant

## 2021-10-17 ENCOUNTER — Other Ambulatory Visit: Payer: Self-pay

## 2021-10-17 ENCOUNTER — Encounter: Payer: Self-pay | Admitting: Physician Assistant

## 2021-10-17 VITALS — BP 134/86 | HR 78 | Ht 72.0 in | Wt 175.0 lb

## 2021-10-17 DIAGNOSIS — I359 Nonrheumatic aortic valve disorder, unspecified: Secondary | ICD-10-CM

## 2021-10-17 DIAGNOSIS — M7989 Other specified soft tissue disorders: Secondary | ICD-10-CM | POA: Diagnosis not present

## 2021-10-17 DIAGNOSIS — I5032 Chronic diastolic (congestive) heart failure: Secondary | ICD-10-CM

## 2021-10-17 DIAGNOSIS — N182 Chronic kidney disease, stage 2 (mild): Secondary | ICD-10-CM

## 2021-10-17 DIAGNOSIS — Z952 Presence of prosthetic heart valve: Secondary | ICD-10-CM | POA: Diagnosis not present

## 2021-10-17 DIAGNOSIS — I480 Paroxysmal atrial fibrillation: Secondary | ICD-10-CM

## 2021-10-17 DIAGNOSIS — Z23 Encounter for immunization: Secondary | ICD-10-CM

## 2021-10-17 MED ORDER — FUROSEMIDE 20 MG PO TABS: 20.0000 mg | ORAL_TABLET | ORAL | 3 refills | Status: DC

## 2021-10-17 NOTE — Patient Instructions (Signed)
Medication Instructions:  Your physician has recommended you make the following change in your medication:   TAKE Furosemide 20 mg every other day with extra pill as needed for shortness of breath.   *If you need a refill on your cardiac medications before your next appointment, please call your pharmacy*   Lab Work: None  If you have labs (blood work) drawn today and your tests are completely normal, you will receive your results only by: [place] (if you have MyChart) OR A paper copy in the mail If you have any lab test that is abnormal or we need to change your treatment, we will call you to review the results.   Testing/Procedures: None   Follow-Up: At [facility], you and your health needs are our priority.  As part of our continuing mission to provide you with exceptional heart care, we have created designated Provider Care Teams.  These Care Teams include your primary Cardiologist (physician) and Advanced Practice Providers (APPs -  Physician Assistants and Nurse Practitioners) who all work together to provide you with the care you need, when you need it.   Your next appointment:   3 month(s)  The format for your next appointment:   In Person  Provider:   You may see [name], MD or one of the following Advanced Practice Providers on your designated Care Team:   Murray Hodgkins, NP Christell Faith, PA-C Marrianne Mood, PA-C Cadence Maeser, [place]

## 2021-10-24 ENCOUNTER — Other Ambulatory Visit: Payer: Self-pay | Admitting: Cardiovascular Disease

## 2021-10-24 DIAGNOSIS — I480 Paroxysmal atrial fibrillation: Secondary | ICD-10-CM

## 2021-10-25 ENCOUNTER — Other Ambulatory Visit (INDEPENDENT_AMBULATORY_CARE_PROVIDER_SITE_OTHER): Payer: Medicare Other

## 2021-10-25 ENCOUNTER — Other Ambulatory Visit: Payer: Self-pay

## 2021-10-25 ENCOUNTER — Telehealth: Payer: Self-pay

## 2021-10-25 DIAGNOSIS — I480 Paroxysmal atrial fibrillation: Secondary | ICD-10-CM | POA: Diagnosis not present

## 2021-10-25 NOTE — Telephone Encounter (Signed)
The patient came in for a lab draw today. Awaiting results.

## 2021-10-25 NOTE — Telephone Encounter (Signed)
Please have him come in for a nonfasting BMET to trend renal function. Will calculate Creatinine clearance based on updated labs and provide recommendations on Eliquis dosing vs transition to Xarelto.

## 2021-10-25 NOTE — Telephone Encounter (Signed)
Spoke with patients daughter Jacob Simpson per [employer] on file. Informed her of the below recommendations and scheduled patient to come in for a lab draw this Friday 10/27/21. Jacob Simpson was very grateful for the follow up, verbalized understanding and agreed with plan.  Rise Mu, PA-C 30 minutes ago (10:02 AM)   Please have him come in for a nonfasting BMET to trend renal function. Will calculate Creatinine clearance based on updated labs and provide recommendations on Eliquis dosing vs transition to Xarelto.       Note    Jacob Bunting, RN routed conversation to Rise Mu, PA-C 2 hours ago (7:51 AM)   Jacob Bunting, RN 2 hours ago (7:50 AM)   Eliquis 5 mg refill request received. Patient is 87 years old, weight- 79.4 kg, Crea- 1.5 on 08/04/21, Diagnosis-PAF, and last seen by Christell Faith, PA on 10/17/21. Pt's age > 14, SCR = 1.5, CrCl < 50, so pt qualifies for dosage decrease to 2.5 mg BID.  Routing to [place] for his recommendation

## 2021-10-25 NOTE — Telephone Encounter (Signed)
Eliquis 5 mg refill request received. Patient is 87 years old, weight- 79.4 kg, Crea- 1.5 on 08/04/21, Diagnosis-PAF, and last seen by Christell Faith, PA on 10/17/21. Pt's age > 85, SCR = 1.5, CrCl < 50, so pt qualifies for dosage decrease to 2.5 mg BID.  Routing to [place] for his recommendation.

## 2021-10-25 NOTE — Telephone Encounter (Signed)
See separate telephone encounter from today, Patient is scheduled for labs on 10/27/21.

## 2021-10-26 LAB — BASIC METABOLIC PANEL
BUN/Creatinine Ratio: 12 (ref 10–24)
BUN: 16 mg/dL (ref 8–27)
CO2: 24 mmol/L (ref 20–29)
Calcium: 8.6 mg/dL (ref 8.6–10.2)
Chloride: 101 mmol/L (ref 96–106)
Creatinine, Ser: 1.39 mg/dL — ABNORMAL HIGH (ref 0.76–1.27)
Glucose: 106 mg/dL — ABNORMAL HIGH (ref 70–99)
Potassium: 3.5 mmol/L (ref 3.5–5.2)
Sodium: 139 mmol/L (ref 134–144)
eGFR: 48 mL/min/{1.73_m2} — ABNORMAL LOW (ref >59)

## 2021-10-26 NOTE — Telephone Encounter (Signed)
Reviewed the patient's chart. His BMP from 10/25/21 has resulted.  Age: 87  Wt: 79.4 kg (10/17/21) Sr Creatinine: 1.39 (10/25/21)   To [facility] & Christell Faith, PA to review for Eliquis refill & dosing.

## 2021-10-26 NOTE — Telephone Encounter (Signed)
Prescription refill request for Eliquis received. Indication: PAF Last office visit:  10/17/21  Patrecia Pour PA-C Scr: 1.39 on 10/25/21 Age: 87 Weight: 79.4 CrCl: 40.46  Based on above findings Elqiuis 5mg  twice daily is the appropriate dose.  Refill approved.

## 2021-10-27 ENCOUNTER — Other Ambulatory Visit: Payer: Medicare Other

## 2022-01-05 ENCOUNTER — Telehealth: Payer: Self-pay | Admitting: Physician Assistant

## 2022-01-05 NOTE — Telephone Encounter (Signed)
Left voicemail message to call back for review of medication cost and options.

## 2022-01-05 NOTE — Telephone Encounter (Signed)
Pt c/o medication issue:  1. Name of Medication: Eliquis  2. How are you currently taking this medication (dosage and times per day)? 5 mg po BID   3. Are you having a reaction (difficulty breathing--STAT)? No   4. What is your medication issue? Cost too much patient family asking for samples or other options

## 2022-01-05 NOTE — Telephone Encounter (Signed)
Made patient aware that I have samples for patient ready for pickup.   Drug name: Eliquis       Strength: 5 MG         Qty: 4 boxes  LOT: [id number]  Exp.Date: 06/2023   Janan Ridge 2:12 PM 01/05/2022   Also provided them with the number [phone]

## 2022-01-05 NOTE — Telephone Encounter (Signed)
Jacob Simpson calling to check status of request as patient is out of meds.   Patient calling the office for samples of medication:   1.  What medication and dosage are you requesting samples for? Eliquis  2.  Are you currently out of this medication? yes

## 2022-01-22 ENCOUNTER — Ambulatory Visit (INDEPENDENT_AMBULATORY_CARE_PROVIDER_SITE_OTHER): Payer: Medicare Other | Admitting: Physician Assistant

## 2022-01-22 ENCOUNTER — Encounter: Payer: Self-pay | Admitting: Physician Assistant

## 2022-01-22 ENCOUNTER — Other Ambulatory Visit: Payer: Self-pay

## 2022-01-22 VITALS — BP 154/90 | HR 69 | Wt 176.1 lb

## 2022-01-22 DIAGNOSIS — Z952 Presence of prosthetic heart valve: Secondary | ICD-10-CM

## 2022-01-22 DIAGNOSIS — I5032 Chronic diastolic (congestive) heart failure: Secondary | ICD-10-CM | POA: Diagnosis not present

## 2022-01-22 DIAGNOSIS — M7989 Other specified soft tissue disorders: Secondary | ICD-10-CM

## 2022-01-22 DIAGNOSIS — N182 Chronic kidney disease, stage 2 (mild): Secondary | ICD-10-CM

## 2022-01-22 DIAGNOSIS — I480 Paroxysmal atrial fibrillation: Secondary | ICD-10-CM

## 2022-01-22 DIAGNOSIS — I359 Nonrheumatic aortic valve disorder, unspecified: Secondary | ICD-10-CM | POA: Diagnosis not present

## 2022-01-22 DIAGNOSIS — I10 Essential (primary) hypertension: Secondary | ICD-10-CM

## 2022-01-22 NOTE — Progress Notes (Signed)
Cardiology Office Note    Date:  01/22/2022   ID:  JORAH HUA, DOB 08/25/1930, MRN [id number]  PCP:  Barbaraann Boys, MD  Cardiologist:  [name], MD  Electrophysiologist:  None   Chief Complaint: Follow up  History of Present Illness:   DOMIQUE CLAPPER is a 88 y.o. male with history of nonobstructive CAD by [facility] in 07/2019, severe aortic stenosis status post TAVR in 09/2019, HFpEF, PAF on Eliquis, B-cell lymphoma in remission, CKD stage II-III, TIA, HTN, pulmonary nodules related to infection/inflammation that have resolved by CT imaging in 06/2021, and prior tobacco use who presents for follow-up of HFpEF.   He has been monitored for progressive aortic stenosis over the years.  As part of this, diagnostic cardiac cath in 07/2019 showed minimal nonobstructive CAD.  Imaging prior to TAVR showed no evidence of aortic aneurysm.  He subsequently underwent TAVR in 09/2019.  Most recent echo from 09/2020 showed an EF of 55 to 99%, grade 1 diastolic dysfunction, moderate mitral regurgitation, normal functioning TAVR prosthesis with a mean gradient of 5.2 mmHg, and a mildly dilated ascending aorta measuring 40 mm.  He was seen in the office in 03/2021 noting significant swelling of the bilateral lower extremities with a 12 pound weight gain.  He reported more shortness of breath.  He was taking 10 mg of furosemide as needed.  He was maintaining sinus rhythm.  His Lasix was titrated to 40 mg daily along with the addition of KCl.  He was seen in the office in 06/2021 and was doing well from a cardiac perspective.  Following the escalation of diuresis, as outlined above, his weight was down 13 pounds when compared to his prior clinic visit.  He remained on furosemide 40 mg daily with stable renal function noted at that time.  He was drinking greater than 2 L of fluid per day.  He did report some longstanding history of bilateral lower extremity swelling following knee replacements in the remote past.  With  diuresis, his dyspnea had improved.  His amlodipine was decreased to 5 mg, metoprolol was discontinued, and he was transitioned to carvedilol.   He was seen by his PCP on 08/17/2021 with a cough that was different than his longstanding cough with associated nasal congestion.  By their scale, his weight was up 6 pounds when compared to his visit 2 months prior.  proBNP was normal.  He was treated with inhalers.  He was last seen in 10/2021 and was doing very well from a cardiac perspective.  He remained on Lasix 20 mg daily, though had been out for a couple of days.  His weight was down 4 pounds.  He was transitioned to Lasix 20 mg every other day.     He comes in accompanied by one of his daughters today and is doing well from a cardiac perspective.  He is without symptoms of angina or decompensation.  His weight remains stable.  He is tolerating cardiac medications without issues.  No orthopnea or PND.  He has stable chronic bilateral lower extremity swelling which patient and daughter indicate is at baseline.  He does have some assistance during the day with his daughters, and remains independent in the evening/nighttime hours.  They also report a long history of intermittent dizzy episodes with prior unrevealing work-up at [facility].  Over time, these dizzy episodes have decreased in frequency, though he did have one the other week.  At this time, he feels like he is  doing well.     Labs independently reviewed: 11/2021 - potassium 3.8, BUN 15, SCr 1.4albumin 4.2, AST/ALT normal, Hgb 14.8, PLT 143 04/2021 - A1c 6.5 01/2021 - TC 178, TG 159, HDL 59, LDL 87, magnesium 2.2    Past Medical History:  Diagnosis Date   Aortic aneurysm (HCC)    Asthma    Carotid artery disease (HCC)    Chronic diastolic CHF (congestive heart failure) ([place])    Diabetes mellitus ([place])    [place]    History of TIA (transient ischemic attack)    HTN (hypertension)    Lymphoma (HCC)    PAF (paroxysmal atrial fibrillation)  (HCC)    Pneumonia    recurrent PNA   Radiculitis, lumbosacral    s/p spinal injections   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   s/p TAVR with Edwards with 29 mm Edwards Sapein via the TF approach with Dr. Burt Knack and Dr. Cyndia Bent   Severe aortic stenosis     Past Surgical History:  Procedure Laterality Date   BACK SURGERY  12/89   CATARACT EXTRACTION W/ INTRAOCULAR LENS IMPLANT Right 12/00   CATARACT EXTRACTION W/ INTRAOCULAR LENS IMPLANT Left 8/01   foot sugery Right 6/03   FOOT SURGERY Left 1/96   hammertoe   KIDNEY STONE SURGERY  7/90   RIGHT/LEFT HEART CATH AND CORONARY ANGIOGRAPHY N/A 08/10/2019   Procedure: RIGHT/LEFT HEART CATH AND CORONARY ANGIOGRAPHY;  Surgeon: Wellington Hampshire, MD;  Location: [facility];  Service: Cardiovascular;  Laterality: N/A;   ROTATOR CUFF REPAIR Right 1/98   TEE WITHOUT CARDIOVERSION N/A 09/22/2019   Procedure: TRANSESOPHAGEAL ECHOCARDIOGRAM (TEE);  Surgeon: Sherren Mocha, MD;  Location: [facility];  Service: Open Heart Surgery;  Laterality: N/A;   TOTAL KNEE ARTHROPLASTY Bilateral 1987   TRANSCATHETER AORTIC VALVE REPLACEMENT, TRANSFEMORAL N/A 09/22/2019   Procedure: TRANSCATHETER AORTIC VALVE REPLACEMENT, TRANSFEMORAL;  Surgeon: Sherren Mocha, MD;  Location: [facility];  Service: Open Heart Surgery;  Laterality: N/A;    Current Medications: Current Meds  Medication Sig   acetaminophen (TYLENOL) 500 MG tablet Take 1,000 mg by mouth every 6 (six) hours as needed for mild pain, moderate pain or fever.   amLODipine (NORVASC) 5 MG tablet Take 1 tablet (5 mg total) by mouth daily.   atorvastatin (LIPITOR) 40 MG tablet Take 40 mg by mouth daily.   azelastine (ASTELIN) 0.1 % nasal spray Place 1 spray into both nostrils 2 (two) times daily as needed for rhinitis or allergies. Use in each nostril as directed   carvedilol (COREG) 25 MG tablet Take 1 tablet (25 mg total) by mouth 2 (two) times daily.    cetirizine (ZYRTEC) 10 MG tablet Take 10 mg by mouth daily as needed for allergies.    diphenhydrAMINE (BENADRYL) 25 MG tablet Take 25 mg by mouth every morning.   ELIQUIS 5 MG TABS tablet TAKE 1 TABLET(5 MG) BY MOUTH TWICE DAILY   furosemide (LASIX) 20 MG tablet Take 1 tablet (20 mg total) by mouth as directed. Take one tablet every other day with an extra pill as needed for shortness of breath.   meclizine (ANTIVERT) 12.5 MG tablet Take 12.5 mg by mouth 3 (three) times daily as needed for dizziness.   mirtazapine (REMERON) 15 MG tablet Take 15 mg by mouth at bedtime.   omeprazole (PRILOSEC) 20 MG capsule Take 20 mg by mouth daily as needed (stomach acid).   ondansetron (ZOFRAN-ODT) 8 MG disintegrating tablet Take 8  mg by mouth every 8 (eight) hours as needed for nausea or vomiting.   polyethylene glycol (MIRALAX) 17 g packet Take 17 g by mouth daily as needed for moderate constipation.   senna-docusate (SENOKOT-S) 8.6-50 MG tablet Take by mouth as needed.   tamsulosin (FLOMAX) 0.4 MG CAPS capsule Take 1 capsule (0.4 mg total) by mouth daily after supper.    Allergies:   Penicillins   Social History   Socioeconomic History   Marital status: Widowed    Spouse name: Not on file   Number of children: 5   Years of education: Not on file   Highest education level: Not on file  Occupational History   Occupation: Retired   Tobacco Use   Smoking status: Former   Smokeless tobacco: Never   Tobacco comments:    quit 1989  Vaping Use   Vaping Use: Never used  Substance and Sexual Activity   Alcohol use: No   Drug use: No   Sexual activity: Not on file  Other Topics Concern   Not on file  Social History Narrative   Not on file   Social Determinants of Health   Financial Resource Strain: Not on file  Food Insecurity: Not on file  Transportation Needs: Not on file  Physical Activity: Not on file  Stress: Not on file  Social Connections: Not on file     Family History:  The  patient's family history includes Heart disease in his father.  ROS:   Review of Systems  Constitutional:  Positive for malaise/fatigue. Negative for chills, diaphoresis, fever and weight loss.  HENT:  Negative for congestion.   Eyes:  Negative for discharge and redness.  Respiratory:  Negative for cough, sputum production, shortness of breath and wheezing.   Cardiovascular:  Positive for leg swelling. Negative for chest pain, palpitations, orthopnea, claudication and PND.  Gastrointestinal:  Negative for abdominal pain, blood in stool, heartburn, melena, nausea and vomiting.  Musculoskeletal:  Negative for falls and myalgias.  Skin:  Negative for rash.  Neurological:  Positive for dizziness. Negative for tingling, tremors, sensory change, speech change, focal weakness, loss of consciousness and weakness.  Endo/Heme/Allergies:  Does not bruise/bleed easily.  Psychiatric/Behavioral:  Negative for substance abuse. The patient is not nervous/anxious.   All other systems reviewed and are negative.   EKGs/Labs/Other Studies Reviewed:    Studies reviewed were summarized above. The additional studies were reviewed today:  2D echo 09/2020: 1. Left ventricular ejection fraction, by estimation, is 55 to 60%. The  left ventricle has normal function. The left ventricle has no regional  wall motion abnormalities. There is mild left ventricular hypertrophy.  Left ventricular diastolic parameters  are consistent with Grade I diastolic dysfunction (impaired relaxation).   2. Right ventricular systolic function is normal. The right ventricular  size is normal. There is normal pulmonary artery systolic pressure.   3. Left atrial size was mildly dilated.   4. Moderate mitral valve regurgitation.   5. The aortic valve has been repaired/replaced, s/p TAVR. Aortic valve  regurgitation is not visualized. no significant aortic stenosis. There is  a 29 mm Sapien prosthetic (TAVR) valve present in the aortic  position.  Procedure Date: 09/22/19. Aortic  valve area, by VTI measures 2.80 cm. Aortic valve mean gradient measures  5.2 mmHg.   6. There is mild dilatation of the ascending aorta, measuring 40 mm. __________   2D echo 09/2019: 1. Left ventricular ejection fraction, by visual estimation, is 50 to  55%.  The left ventricle has normal function. There is no left ventricular  hypertrophy.   2. Left ventricular diastolic parameters are consistent with Grade I  diastolic dysfunction (impaired relaxation).   3. Global right ventricle has normal systolic function.The right  ventricular size is normal. No increase in right ventricular wall  thickness.   4. Left atrial size was normal.   5. Right atrial size was moderately dilated.   6. The mitral valve is normal in structure. Mild mitral valve  regurgitation. Mild mitral stenosis.   7. The tricuspid valve is normal in structure. Tricuspid valve  regurgitation is trivial.   8. Aortic valve regurgitation is not visualized.   9. The pulmonic valve was grossly normal. Pulmonic valve regurgitation is  not visualized.  10. Normal pulmonary artery systolic pressure.  11. The atrial septum is grossly normal. __________   [facility] 07/2019: 1.  Minimal nonobstructive coronary artery disease. 2.  Severe aortic stenosis by echo.  I did not attempt to cross the aortic valve which was noted to be heavily calcified with restricted opening on fluoroscopy. 3.  Right heart catheterization showed severely elevated filling pressures, severe pulmonary hypertension and mildly reduced cardiac output.  Pulmonary wedge pressure was 33 mmHg with prominent V waves suggestive of mitral regurgitation.  PA pressure was 74/30 with a mean of 50 mmHg.  Cardiac output was 4.15 with a cardiac index of 2.09.   Recommendations: I am going to resume furosemide and increase the dose to 40 mg once daily. Continue evaluation for TAVR. Given underlying chronic kidney disease and  heart failure, consider hospital admission before TAVR for diuresis and monitoring of renal function.   EKG:  EKG is ordered today.  The EKG ordered today demonstrates NSR, 69 bpm, 1st degree AV block, left axis deviation, LVH with repolarization abnormalities, consistent with prior tracings  Recent Labs: 10/25/2021: BUN 16; Creatinine, Ser 1.39; Potassium 3.5; Sodium 139  Recent Lipid Panel No results found for: CHOL, TRIG, HDL, CHOLHDL, VLDL, LDLCALC, LDLDIRECT  PHYSICAL EXAM:    VS:  BP (!) 154/90 (BP Location: Left Arm, Patient Position: Sitting, Cuff Size: Normal)    Pulse 69    Wt 176 lb 2 oz (79.9 kg)    SpO2 95%    BMI 23.89 kg/m   BMI: Body mass index is 23.89 kg/m.  Physical Exam Vitals reviewed. Nursing note reviewed: Repeat BP 138/79. Constitutional:      Appearance: He is well-developed.  HENT:     Head: Normocephalic and atraumatic.  Eyes:     General:        Right eye: No discharge.        Left eye: No discharge.  Neck:     Vascular: No JVD.  Cardiovascular:     Rate and Rhythm: Normal rate and regular rhythm.     Pulses:          Posterior tibial pulses are 2+ on the right side and 2+ on the left side.     Heart sounds: Normal heart sounds, S1 normal and S2 normal. Heart sounds not distant. No midsystolic click and no opening snap. No murmur heard.   No friction rub.  Pulmonary:     Effort: Pulmonary effort is normal. No respiratory distress.     Breath sounds: Normal breath sounds. No decreased breath sounds, wheezing or rales.  Chest:     Chest wall: No tenderness.  Abdominal:     General: There is no distension.  Palpations: Abdomen is soft.     Tenderness: There is no abdominal tenderness.  Musculoskeletal:     Cervical back: Normal range of motion.     Right lower leg: Edema present.     Left lower leg: Edema present.     Comments: 1+ bilateral lower extremity swelling  Skin:    General: Skin is warm and dry.     Nails: There is no clubbing.   Neurological:     Mental Status: He is alert and oriented to person, place, and time.  Psychiatric:        Speech: Speech normal.        Behavior: Behavior normal.        Thought Content: Thought content normal.        Judgment: Judgment normal.    Wt Readings from Last 3 Encounters:  01/22/22 176 lb 2 oz (79.9 kg)  10/17/21 175 lb (79.4 kg)  07/14/21 179 lb (81.2 kg)     ASSESSMENT & PLAN:   HFpEF/lower extremity swelling: He appears euvolemic and well compensated with a stable weight.  He remains on furosemide 20 mg every other day with an additional 20 mg tab as needed for shortness of breath.  Longstanding history of bilateral lower extremity swelling is stable.  Family prefers to maintain him on low-dose amlodipine at this time given he is stable.  Aortic stenosis s/p TAVR: Normal functioning TAVR prosthesis on most recent echo.  PAF: Maintaining sinus rhythm.  Continue carvedilol in place of metoprolol given significantly improved blood pressures.  Given a CHADS2VASc of at least 7 (CHF, HTN, age x 2, TIA x 2, vascular disease), he remains on apixaban and does not currently be at reduced dosing criteria.  Recent CBC demonstrated normal hemoglobin.  No falls or symptoms concerning for bleeding.  HTN: Blood pressure is improved on recheck.  He remains on amlodipine, carvedilol, and furosemide as outlined above.  CKD stage II-III: Stable on most recent BMP.  Update kidney function.    Disposition: F/u with Dr. Fletcher Anon or an APP in 6 months.   Medication Adjustments/Labs and Tests Ordered: Current medicines are reviewed at length with the patient today.  Concerns regarding medicines are outlined above. Medication changes, Labs and Tests ordered today are summarized above and listed in the Patient Instructions accessible in Encounters.   Signed, Christell Faith, PA-C 01/22/2022 12:48 PM     [place],  [address] [phone]

## 2022-01-22 NOTE — Patient Instructions (Signed)
Medication Instructions:  No changes at this time.   *If you need a refill on your cardiac medications before your next appointment, please call your pharmacy*   Lab Work: BMET today  If you have labs (blood work) drawn today and your tests are completely normal, you will receive your results only by: [place] (if you have MyChart) OR A paper copy in the mail If you have any lab test that is abnormal or we need to change your treatment, we will call you to review the results.   Testing/Procedures: None   Follow-Up: At [facility], you and your health needs are our priority.  As part of our continuing mission to provide you with exceptional heart care, we have created designated Provider Care Teams.  These Care Teams include your primary Cardiologist (physician) and Advanced Practice Providers (APPs -  Physician Assistants and Nurse Practitioners) who all work together to provide you with the care you need, when you need it.  Your next appointment:   6 month(s)  The format for your next appointment:   In Person  Provider:   [name], MD or Christell Faith, PA-C

## 2022-01-23 DIAGNOSIS — I5032 Chronic diastolic (congestive) heart failure: Secondary | ICD-10-CM

## 2022-01-23 DIAGNOSIS — N182 Chronic kidney disease, stage 2 (mild): Secondary | ICD-10-CM

## 2022-01-23 DIAGNOSIS — M7989 Other specified soft tissue disorders: Secondary | ICD-10-CM

## 2022-01-23 DIAGNOSIS — I480 Paroxysmal atrial fibrillation: Secondary | ICD-10-CM

## 2022-01-23 LAB — BASIC METABOLIC PANEL
BUN/Creatinine Ratio: 14 (ref 10–24)
BUN: 23 mg/dL (ref 8–27)
CO2: 23 mmol/L (ref 20–29)
Calcium: 8.6 mg/dL (ref 8.6–10.2)
Chloride: 102 mmol/L (ref 96–106)
Creatinine, Ser: 1.59 mg/dL — ABNORMAL HIGH (ref 0.76–1.27)
Glucose: 112 mg/dL — ABNORMAL HIGH (ref 70–99)
Potassium: 3.8 mmol/L (ref 3.5–5.2)
Sodium: 140 mmol/L (ref 134–144)
eGFR: 41 mL/min/{1.73_m2} — ABNORMAL LOW (ref >59)

## 2022-01-23 MED ORDER — FUROSEMIDE 20 MG PO TABS: 20.0000 mg | ORAL_TABLET | ORAL | 3 refills | Status: DC

## 2022-01-23 NOTE — Telephone Encounter (Signed)
-----   Message from Rise Mu, PA-C sent at 01/23/2022  2:16 PM EST ----- Pam,   Can we update his Lasix to reflect the change of 20 mg M/W/F. Also, we need to place a futher lab order for a BMET to be drawn at the [place] location. His daughter said there is a [employer] there she can take him to.   Thanks

## 2022-02-12 ENCOUNTER — Telehealth: Payer: Self-pay | Admitting: *Deleted

## 2022-02-12 ENCOUNTER — Other Ambulatory Visit
Admission: RE | Admit: 2022-02-12 | Discharge: 2022-02-12 | Disposition: A | Discharge status: Home / Self Care | Payer: Medicare Other | Attending: Physician Assistant | Admitting: Physician Assistant

## 2022-02-12 DIAGNOSIS — N182 Chronic kidney disease, stage 2 (mild): Secondary | ICD-10-CM | POA: Diagnosis not present

## 2022-02-12 DIAGNOSIS — I480 Paroxysmal atrial fibrillation: Secondary | ICD-10-CM

## 2022-02-12 DIAGNOSIS — I5032 Chronic diastolic (congestive) heart failure: Secondary | ICD-10-CM

## 2022-02-12 DIAGNOSIS — M7989 Other specified soft tissue disorders: Secondary | ICD-10-CM | POA: Insufficient documentation

## 2022-02-12 LAB — BASIC METABOLIC PANEL
Anion gap: 9 (ref 5–15)
BUN: 19 mg/dL (ref 8–23)
CO2: 25 mmol/L (ref 22–32)
Calcium: 8.3 mg/dL — ABNORMAL LOW (ref 8.9–10.3)
Chloride: 104 mmol/L (ref 98–111)
Creatinine, Ser: 1.64 mg/dL — ABNORMAL HIGH (ref 0.61–1.24)
GFR, Estimated: 40 mL/min — ABNORMAL LOW (ref >60)
Glucose, Bld: 184 mg/dL — ABNORMAL HIGH (ref 70–99)
Potassium: 3.7 mmol/L (ref 3.5–5.1)
Sodium: 138 mmol/L (ref 135–145)

## 2022-02-12 MED ORDER — FUROSEMIDE 20 MG PO TABS: 20.0000 mg | ORAL_TABLET | ORAL | 3 refills | Status: AC

## 2022-02-12 NOTE — Telephone Encounter (Signed)
-----   Message from Rise Mu, PA-C sent at 02/12/2022  2:53 PM EST ----- Please inform the patient (daughter [name]), that his kidney function remains abnormal and above where he was running back in the summer/fall. Reading is stable when compared to his check earlier this month.   Recommendations: -Please have him increase his water intake and transition Lasix to only Mondays and Fridays (ease of dosing for him and the family, rather than prn) -Recheck BMP in 1 week, this can be a [place] since that is closer for them

## 2022-02-12 NOTE — Telephone Encounter (Signed)
Reviewed results and recommendations with patients daughter per release form. She verbalized understanding of dose changes and repeat labs. She was agreeable with plan and had no further questions at this time.

## 2022-02-12 NOTE — Telephone Encounter (Signed)
Left voicemail message to call back for review of results and recommendations.  

## 2022-02-21 ENCOUNTER — Other Ambulatory Visit
Admission: RE | Admit: 2022-02-21 | Discharge: 2022-02-21 | Disposition: A | Discharge status: Home / Self Care | Payer: Medicare Other | Attending: Physician Assistant | Admitting: Physician Assistant

## 2022-02-21 DIAGNOSIS — I480 Paroxysmal atrial fibrillation: Secondary | ICD-10-CM | POA: Insufficient documentation

## 2022-02-21 DIAGNOSIS — I5032 Chronic diastolic (congestive) heart failure: Secondary | ICD-10-CM | POA: Diagnosis present

## 2022-02-21 DIAGNOSIS — N182 Chronic kidney disease, stage 2 (mild): Secondary | ICD-10-CM | POA: Insufficient documentation

## 2022-02-21 LAB — BASIC METABOLIC PANEL
Anion gap: 7 (ref 5–15)
BUN: 21 mg/dL (ref 8–23)
CO2: 25 mmol/L (ref 22–32)
Calcium: 8.2 mg/dL — ABNORMAL LOW (ref 8.9–10.3)
Chloride: 102 mmol/L (ref 98–111)
Creatinine, Ser: 1.47 mg/dL — ABNORMAL HIGH (ref 0.61–1.24)
GFR, Estimated: 45 mL/min — ABNORMAL LOW (ref >60)
Glucose, Bld: 157 mg/dL — ABNORMAL HIGH (ref 70–99)
Potassium: 3.5 mmol/L (ref 3.5–5.1)
Sodium: 134 mmol/L — ABNORMAL LOW (ref 135–145)

## 2022-02-21 NOTE — Addendum Note (Signed)
Addended by: Darrick Grinder on: 02/21/2022 02:46 PM ? ? Modules accepted: Orders ? ?

## 2022-05-28 ENCOUNTER — Telehealth: Payer: Self-pay | Admitting: Cardiovascular Disease

## 2022-05-28 NOTE — Telephone Encounter (Signed)
Do not see any significant drug drug interactions on patient;s med list

## 2022-05-28 NOTE — Telephone Encounter (Signed)
Pt c/o medication issue:  1. Name of Medication:   amLODipine (NORVASC) 5 MG tablet    carvedilol (COREG) 25 MG tablet    ELIQUIS 5 MG TABS tablet    2. How are you currently taking this medication (dosage and times per day)?   3. Are you having a reaction (difficulty breathing--STAT)? No  4. What is your medication issue? Pt's daughter would like for pt's medications to be reviewed and made sure that there is not an interaction with any other medications that he is currently taking. Daughter states that pt's PCP told her that there may be interactions with some of pt's medications. Daughter would like a call back regarding this matter. Please advise

## 2022-05-28 NOTE — Telephone Encounter (Signed)
Spoke with patients daughter per release form. Our provider and pharmacist reviewed all medications and there were no significant drug drug interactions. Updated patients daughter per release form and she was appreciative for the information and call back.

## 2022-05-28 NOTE — Telephone Encounter (Signed)
Discussed with provider and recommendations were to check with pharmacy.

## 2022-05-31 ENCOUNTER — Other Ambulatory Visit: Payer: Self-pay | Admitting: Cardiovascular Disease

## 2022-05-31 DIAGNOSIS — I480 Paroxysmal atrial fibrillation: Secondary | ICD-10-CM

## 2022-05-31 NOTE — Telephone Encounter (Signed)
Pt last saw Jacob Faith, PA on 01/22/22, last labs 05/22/22 Creat 1.8, age 88, weight 79.9kg, based on Creat >1.5 and age>80 pt is not on appropriate dosage of Eliquis. Pt's Creat has fluctuated in the past and hs been borderline in the past Creat 1.45 on 03/07/22, Creat 1.47 on 02/21/22, Creat 1.64 on 02/12/22, Creat 1.59 on 01/22/22.  Please advise if Eliquis dosage change appropriate.  Thanks

## 2022-06-04 NOTE — Telephone Encounter (Signed)
Prescription refill request for Eliquis received. Indication:Afib Last office visit:2/23 Scr:1.8 Age: 88 Weight:79.9 kg  Under review

## 2022-06-04 NOTE — Telephone Encounter (Signed)
Yes, recommend reducing dose to 2.'5mg'$  BID

## 2022-06-05 ENCOUNTER — Telehealth: Payer: Self-pay | Admitting: Cardiovascular Disease

## 2022-06-05 NOTE — Telephone Encounter (Signed)
Refill request

## 2022-06-05 NOTE — Telephone Encounter (Signed)
Prescription refill request for Eliquis received. Indication:Afib Last office visit:2/23 Scr:1.8 Age: 88 Weight:79.9 kg  Under review

## 2022-06-05 NOTE — Telephone Encounter (Signed)
*  STAT* If patient is at the pharmacy, call can be transferred to refill team.   1. Which medications need to be refilled? (please list name of each medication and dose if known) ELIQUIS 5 MG TABS tablet  2. Which pharmacy/location (including street and city if local pharmacy) is medication to be sent to? [id number] [place], [place] - [address]. Do they need a 30 day or 90 day supply? [address]

## 2022-06-06 ENCOUNTER — Telehealth: Payer: Self-pay

## 2022-06-06 ENCOUNTER — Other Ambulatory Visit: Payer: Self-pay

## 2022-06-06 DIAGNOSIS — I480 Paroxysmal atrial fibrillation: Secondary | ICD-10-CM

## 2022-06-06 MED ORDER — APIXABAN 2.5 MG PO TABS: ORAL_TABLET | ORAL | 0 refills | Status: DC

## 2022-06-06 NOTE — Telephone Encounter (Signed)
Recommend switching to 2.'5mg'$  BID but a 90 day supply

## 2022-06-06 NOTE — Telephone Encounter (Signed)
Lp's daughter message informing her of dose reduction for Eliquis 5 mg to 2.5 mg bid.  I told her to call, if questions.

## 2022-06-08 MED ORDER — APIXABAN 2.5 MG PO TABS: ORAL_TABLET | ORAL | 1 refills | Status: DC

## 2022-06-08 NOTE — Telephone Encounter (Signed)
Medication sent to requested pharmacy.

## 2022-07-07 ENCOUNTER — Other Ambulatory Visit: Payer: Self-pay | Admitting: Physician Assistant

## 2022-07-19 ENCOUNTER — Encounter: Payer: Self-pay | Admitting: Physician Assistant

## 2022-07-19 ENCOUNTER — Ambulatory Visit (INDEPENDENT_AMBULATORY_CARE_PROVIDER_SITE_OTHER): Payer: Medicare Other | Admitting: Physician Assistant

## 2022-07-19 VITALS — BP 148/80 | HR 68 | Ht 70.0 in | Wt 168.4 lb

## 2022-07-19 DIAGNOSIS — Z952 Presence of prosthetic heart valve: Secondary | ICD-10-CM | POA: Diagnosis not present

## 2022-07-19 DIAGNOSIS — I480 Paroxysmal atrial fibrillation: Secondary | ICD-10-CM | POA: Diagnosis not present

## 2022-07-19 DIAGNOSIS — N182 Chronic kidney disease, stage 2 (mild): Secondary | ICD-10-CM

## 2022-07-19 DIAGNOSIS — I5032 Chronic diastolic (congestive) heart failure: Secondary | ICD-10-CM | POA: Diagnosis not present

## 2022-07-19 DIAGNOSIS — I359 Nonrheumatic aortic valve disorder, unspecified: Secondary | ICD-10-CM | POA: Diagnosis not present

## 2022-07-19 DIAGNOSIS — I10 Essential (primary) hypertension: Secondary | ICD-10-CM

## 2022-07-19 MED ORDER — BENZONATATE 200 MG PO CAPS: 200.0000 mg | ORAL_CAPSULE | Freq: Three times a day (TID) | ORAL | 3 refills | Status: DC | PRN

## 2022-07-19 NOTE — Progress Notes (Signed)
Cardiology Office Note    Date:  07/19/2022   ID:  TRYTON BODI, DOB 03-21-1930, MRN [id number]  PCP:  Barbaraann Boys, MD  Cardiologist:  [name], MD  Electrophysiologist:  None   Chief Complaint: Follow-up  History of Present Illness:   Jacob Simpson is a 88 y.o. male with history of nonobstructive CAD by [place] in 07/2019, severe aortic stenosis status post TAVR in 09/2019, HFpEF, PAF on Eliquis, B-cell lymphoma in remission, CKD stage II-III, TIA, HTN, pulmonary nodules related to infection/inflammation that have resolved by CT imaging in 06/2021, and prior tobacco use who presents for follow-up of HFpEF.   He has been monitored for progressive aortic stenosis over the years.  As part of this, diagnostic cardiac cath in 07/2019 showed minimal nonobstructive CAD.  Imaging prior to TAVR showed no evidence of aortic aneurysm.  He subsequently underwent TAVR in 09/2019.  Most recent echo from 09/2020 showed an EF of 55 to 50%, grade 1 diastolic dysfunction, moderate mitral regurgitation, normal functioning TAVR prosthesis with a mean gradient of 5.2 mmHg, and a mildly dilated ascending aorta measuring 40 mm.  He was seen in the office in 03/2021 noting significant swelling of the bilateral lower extremities with a 12 pound weight gain.  He reported more shortness of breath.  He was taking 10 mg of furosemide as needed.  He was maintaining sinus rhythm.  His Lasix was titrated to 40 mg daily along with the addition of KCl.  He was seen in the office in 06/2021 and was doing well from a cardiac perspective.  Following the escalation of diuresis, as outlined above, his weight was down 13 pounds when compared to his prior clinic visit.  He remained on furosemide 40 mg daily with stable renal function noted at that time.  He was drinking greater than 2 L of fluid per day.  He did report some longstanding history of bilateral lower extremity swelling following knee replacements in the remote past.  With  diuresis, his dyspnea had improved.  His amlodipine was decreased to 5 mg, metoprolol was discontinued, and he was transitioned to carvedilol.   He was seen by his PCP on 08/17/2021 with a cough that was different than his longstanding cough with associated nasal congestion.  By their scale, his weight was up 6 pounds when compared to his visit 2 months prior.  proBNP was normal.  He was treated with inhalers.   He was seen in 10/2021 and was doing very well from a cardiac perspective.  He remained on Lasix 20 mg daily, though had been out for a couple of days.  His weight was down 4 pounds.  He was transitioned to Lasix 20 mg every other day.  He was last seen in our office in 01/2022 and was without symptoms of angina or decompensation.  His weight and chronic lower extremity swelling were stable.  With declining renal function, he was transitioned to Lasix on Mondays and Fridays.  He has been seen by his PCP for a cough productive of green sputum.  Chest x-ray without evidence of pneumonia.  COVID-negative.  proBNP normal.  Repeat chest x-ray without acute findings with noted left lower lobe scarring.  Symptoms were felt to be possibly allergy related with postnasal drip.  He comes in accompanied by his daughter today and is doing well from a cardiac perspective, without symptoms of angina or decompensation.  His weight is down 8 pounds today by our scale when compared  to his visit in 01/2022.  Is lower extremity swelling is stable.  No orthopnea or PND.  He does continue to note a cough that is productive of clear to yellow to green sputum and more noticeable when laying down at night.  Tessalon Perles help.  At this time, he feels like he is doing well from a cardiac perspective.      Labs independently reviewed: 06/2022 - Hgb 13.5, PLT 155, potassium 4.0, BUN 15, serum creatinine 1.7, albumin 3.6, AST/ALT normal 05/2022 -TC 116, TG 170, HDL 36, LDL 46, A1c 6.8, TSH normal    Past Medical History:   Diagnosis Date   Aortic aneurysm (HCC)    Asthma    Carotid artery disease (HCC)    Chronic diastolic CHF (congestive heart failure) (HCC)    Diabetes mellitus ([place])    [place]    History of TIA (transient ischemic attack)    HTN (hypertension)    Lymphoma (HCC)    PAF (paroxysmal atrial fibrillation) (HCC)    Pneumonia    recurrent PNA   Radiculitis, lumbosacral    s/p spinal injections   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   s/p TAVR with Edwards with 29 mm Edwards Sapein via the TF approach with Dr. Burt Knack and Dr. Cyndia Bent   Severe aortic stenosis     Past Surgical History:  Procedure Laterality Date   BACK SURGERY  12/89   CATARACT EXTRACTION W/ INTRAOCULAR LENS IMPLANT Right 12/00   CATARACT EXTRACTION W/ INTRAOCULAR LENS IMPLANT Left 8/01   foot sugery Right 6/03   FOOT SURGERY Left 1/96   [place]   KIDNEY STONE SURGERY  7/90   RIGHT/LEFT HEART CATH AND CORONARY ANGIOGRAPHY N/A 08/10/2019   Procedure: RIGHT/LEFT HEART CATH AND CORONARY ANGIOGRAPHY;  Surgeon: Wellington Hampshire, MD;  Location: [facility];  Service: Cardiovascular;  Laterality: N/A;   ROTATOR CUFF REPAIR Right 1/98   TEE WITHOUT CARDIOVERSION N/A 09/22/2019   Procedure: TRANSESOPHAGEAL ECHOCARDIOGRAM (TEE);  Surgeon: Sherren Mocha, MD;  Location: [facility];  Service: Open Heart Surgery;  Laterality: N/A;   TOTAL KNEE ARTHROPLASTY Bilateral 1987   TRANSCATHETER AORTIC VALVE REPLACEMENT, TRANSFEMORAL N/A 09/22/2019   Procedure: TRANSCATHETER AORTIC VALVE REPLACEMENT, TRANSFEMORAL;  Surgeon: Sherren Mocha, MD;  Location: [facility];  Service: Open Heart Surgery;  Laterality: N/A;    Current Medications: Current Meds  Medication Sig   acetaminophen (TYLENOL) 500 MG tablet Take 1,000 mg by mouth every 6 (six) hours as needed for mild pain, moderate pain or fever.   amLODipine (NORVASC) 5 MG tablet Take 1 tablet (5 mg total) by mouth daily.   apixaban  (ELIQUIS) 2.5 MG TABS tablet TAKE 1 TABLET (2.5 mg) BY MOUTH TWICE DAILY   atorvastatin (LIPITOR) 40 MG tablet Take 40 mg by mouth daily.   azelastine (ASTELIN) 0.1 % nasal spray Place 1 spray into both nostrils 2 (two) times daily as needed for rhinitis or allergies. Use in each nostril as directed   carvedilol (COREG) 25 MG tablet TAKE 1 TABLET BY MOUTH TWICE A DAY   cetirizine (ZYRTEC) 10 MG tablet Take 10 mg by mouth daily as needed for allergies.    Cholecalciferol (VITAMIN D) 50 MCG (2000 UT) tablet Take 2,000 Units by mouth daily.   diphenhydrAMINE (BENADRYL) 25 MG tablet Take 25 mg by mouth every morning.   furosemide (LASIX) 20 MG tablet Take 1 tablet (20 mg total) by mouth as directed. Take one tablet  every Monday/Friday with an extra pill as needed for shortness of breath.   meclizine (ANTIVERT) 12.5 MG tablet Take 12.5 mg by mouth 3 (three) times daily as needed for dizziness.   mirtazapine (REMERON) 15 MG tablet Take 15 mg by mouth at bedtime.   omeprazole (PRILOSEC) 20 MG capsule Take 20 mg by mouth daily as needed (stomach acid).   ondansetron (ZOFRAN-ODT) 8 MG disintegrating tablet Take 8 mg by mouth every 8 (eight) hours as needed for nausea or vomiting.   polyethylene glycol (MIRALAX) 17 g packet Take 17 g by mouth daily as needed for moderate constipation.   senna-docusate (SENOKOT-S) 8.6-50 MG tablet Take by mouth as needed.   tamsulosin (FLOMAX) 0.4 MG CAPS capsule Take 1 capsule (0.4 mg total) by mouth daily after supper.   [DISCONTINUED] benzonatate (TESSALON) 200 MG capsule Take by mouth 3 (three) times daily as needed.    Allergies:   Penicillins   Social History   Socioeconomic History   Marital status: Widowed    Spouse name: Not on file   Number of children: 5   Years of education: Not on file   Highest education level: Not on file  Occupational History   Occupation: Retired   Tobacco Use   Smoking status: Former   Smokeless tobacco: Never   Tobacco  comments:    quit 1989  Vaping Use   Vaping Use: Never used  Substance and Sexual Activity   Alcohol use: No   Drug use: No   Sexual activity: Not on file  Other Topics Concern   Not on file  Social History Narrative   Not on file   Social Determinants of Health   Financial Resource Strain: Low Risk  (08/10/2019)   Overall Financial Resource Strain (CARDIA)    Difficulty of Paying Living Expenses: Not hard at all  Food Insecurity: No Food Insecurity (08/10/2019)   Hunger Vital Sign    Worried About Running Out of Food in the Last Year: Never true    [place] in the Last Year: Never true  Transportation Needs: No Transportation Needs (08/10/2019)   PRAPARE - Hydrologist (Medical): No    Lack of Transportation (Non-Medical): No  Physical Activity: Sufficiently Active (10/14/2019)   Exercise Vital Sign    Days of Exercise per Week: 7 days    Minutes of Exercise per Session: 30 min  Stress: No Stress Concern Present (08/10/2019)   [place]    Feeling of Stress : Only a little  Social Connections: Moderately Isolated (10/14/2019)   Social Connection and Isolation Panel [NHANES]    Frequency of Communication with Friends and Family: Not on file    Frequency of Social Gatherings with Friends and Family: More than three times a week    Attends Religious Services: 1 to 4 times per year    Active Member of [employer] or Organizations: No    Attends Archivist Meetings: Never    Marital Status: Widowed     Family History:  The patient's family history includes Heart disease in his father.  ROS:   12-point review of systems is negative unless otherwise noted in the HPI.   EKGs/Labs/Other Studies Reviewed:    Studies reviewed were summarized above. The additional studies were reviewed today:  2D echo 09/2020: 1. Left ventricular ejection fraction, by estimation, is 55 to  60%. The  left ventricle has normal function.  The left ventricle has no regional  wall motion abnormalities. There is mild left ventricular hypertrophy.  Left ventricular diastolic parameters  are consistent with Grade I diastolic dysfunction (impaired relaxation).   2. Right ventricular systolic function is normal. The right ventricular  size is normal. There is normal pulmonary artery systolic pressure.   3. Left atrial size was mildly dilated.   4. Moderate mitral valve regurgitation.   5. The aortic valve has been repaired/replaced, s/p TAVR. Aortic valve  regurgitation is not visualized. no significant aortic stenosis. There is  a 29 mm Sapien prosthetic (TAVR) valve present in the aortic position.  Procedure Date: 09/22/19. Aortic  valve area, by VTI measures 2.80 cm. Aortic valve mean gradient measures  5.2 mmHg.   6. There is mild dilatation of the ascending aorta, measuring 40 mm. __________   2D echo 09/2019: 1. Left ventricular ejection fraction, by visual estimation, is 50 to  55%. The left ventricle has normal function. There is no left ventricular  hypertrophy.   2. Left ventricular diastolic parameters are consistent with Grade I  diastolic dysfunction (impaired relaxation).   3. Global right ventricle has normal systolic function.The right  ventricular size is normal. No increase in right ventricular wall  thickness.   4. Left atrial size was normal.   5. Right atrial size was moderately dilated.   6. The mitral valve is normal in structure. Mild mitral valve  regurgitation. Mild mitral stenosis.   7. The tricuspid valve is normal in structure. Tricuspid valve  regurgitation is trivial.   8. Aortic valve regurgitation is not visualized.   9. The pulmonic valve was grossly normal. Pulmonic valve regurgitation is  not visualized.  10. Normal pulmonary artery systolic pressure.  11. The atrial septum is grossly normal. __________   [facility] 07/2019: 1.  Minimal  nonobstructive coronary artery disease. 2.  Severe aortic stenosis by echo.  I did not attempt to cross the aortic valve which was noted to be heavily calcified with restricted opening on fluoroscopy. 3.  Right heart catheterization showed severely elevated filling pressures, severe pulmonary hypertension and mildly reduced cardiac output.  Pulmonary wedge pressure was 33 mmHg with prominent V waves suggestive of mitral regurgitation.  PA pressure was 74/30 with a mean of 50 mmHg.  Cardiac output was 4.15 with a cardiac index of 2.09.   Recommendations: I am going to resume furosemide and increase the dose to 40 mg once daily. Continue evaluation for TAVR. Given underlying chronic kidney disease and heart failure, consider hospital admission before TAVR for diuresis and monitoring of renal function.   EKG:  EKG is ordered today.  The EKG ordered today demonstrates NSR, 68 bpm, first-degree AV block, incomplete LBBB, no significant change when compared to prior tracing  Recent Labs: 02/21/2022: BUN 21; Creatinine, Ser 1.47; Potassium 3.5; Sodium 134  Recent Lipid Panel No results found for: "CHOL", "TRIG", "HDL", "CHOLHDL", "VLDL", "LDLCALC", "LDLDIRECT"  PHYSICAL EXAM:    VS:  BP (!) 148/80 (BP Location: Left Arm, Patient Position: Sitting, Cuff Size: Normal)   Pulse 68   Ht '5\' 10"'$  (1.778 m)   Wt 168 lb 6 oz (76.4 kg)   SpO2 98%   BMI 24.16 kg/m   BMI: Body mass index is 24.16 kg/m.  Physical Exam Constitutional:      Appearance: He is well-developed.  HENT:     Head: Normocephalic and atraumatic.  Eyes:     General:        Right  eye: No discharge.        Left eye: No discharge.  Neck:     Vascular: No JVD.  Cardiovascular:     Rate and Rhythm: Normal rate and regular rhythm.     Pulses:          Posterior tibial pulses are 2+ on the right side and 2+ on the left side.     Heart sounds: S1 normal and S2 normal. Heart sounds not distant. No midsystolic click and no opening  snap. Murmur heard.     Systolic murmur is present with a grade of 1/6 at the upper right sternal border.     No friction rub.  Pulmonary:     Effort: Pulmonary effort is normal. No respiratory distress.     Breath sounds: Normal breath sounds. No decreased breath sounds, wheezing or rales.  Chest:     Chest wall: No tenderness.  Abdominal:     General: There is no distension.  Musculoskeletal:     Cervical back: Normal range of motion.     Right lower leg: Edema present.     Left lower leg: Edema present.     Comments: Trivial bilateral pretibial edema.  Skin:    General: Skin is warm and dry.     Nails: There is no clubbing.  Neurological:     Mental Status: He is alert and oriented to person, place, and time.  Psychiatric:        Speech: Speech normal.        Behavior: Behavior normal.        Thought Content: Thought content normal.        Judgment: Judgment normal.     Wt Readings from Last 3 Encounters:  07/19/22 168 lb 6 oz (76.4 kg)  01/22/22 176 lb 2 oz (79.9 kg)  10/17/21 175 lb (79.4 kg)     ASSESSMENT & PLAN:   HFpEF/lower extremity swelling: He is euvolemic and well compensated.  His weight is down 8 pounds when compared to his last clinic visit, though his renal function remained stable when checked just several days prior.  He remains on furosemide 20 mg Mondays and Fridays.  Due to cough, we will update an echo.  We did refill his Tessalon.  Lower extremity swelling is stable.  He remains on amlodipine.  Aortic stenosis status post TAVR: Normal functioning TAVR on prior echo.  Update.  PAF: Maintaining sinus rhythm.  He remains on carvedilol in place of metoprolol with significantly improved blood pressures.  CHA2DS2-VASc at least 7.  He remains on renally dosed Xarelto (age and serum creatinine).  Recent labs stable as outlined above.  No symptoms concerning for bleeding.  HTN: Blood pressure is mildly elevated in the office today, though typically well  controlled.  No changes were made in therapy today.  Continue to monitor.  CKD stage II-III: Stable on most recent check.    Disposition: F/u with Dr. Fletcher Anon or an APP in 6 months.   Medication Adjustments/Labs and Tests Ordered: Current medicines are reviewed at length with the patient today.  Concerns regarding medicines are outlined above. Medication changes, Labs and Tests ordered today are summarized above and listed in the Patient Instructions accessible in Encounters.   Signed, Christell Faith, PA-C 07/19/2022 11:58 AM     [facility] [address] [place], [place] [address] [phone]

## 2022-07-19 NOTE — Patient Instructions (Signed)
Medication Instructions:  No changes at this time.   *If you need a refill on your cardiac medications before your next appointment, please call your pharmacy*   Lab Work: None  If you have labs (blood work) drawn today and your tests are completely normal, you will receive your results only by: [place] (if you have MyChart) OR A paper copy in the mail If you have any lab test that is abnormal or we need to change your treatment, we will call you to review the results.   Testing/Procedures: Your physician has requested that you have an echocardiogram. Echocardiography is a painless test that uses sound waves to create images of your heart. It provides your doctor with information about the size and shape of your heart and how well your heart's chambers and valves are working. This procedure takes approximately one hour. There are no restrictions for this procedure.    Follow-Up: At [facility], you and your health needs are our priority.  As part of our continuing mission to provide you with exceptional heart care, we have created designated Provider Care Teams.  These Care Teams include your primary Cardiologist (physician) and Advanced Practice Providers (APPs -  Physician Assistants and Nurse Practitioners) who all work together to provide you with the care you need, when you need it.   Your next appointment:   6 month(s)  The format for your next appointment:   In Person  Provider:   [name], MD or Christell Faith, PA-C    Important Information About [place]

## 2022-08-21 ENCOUNTER — Ambulatory Visit: Payer: Medicare Other | Attending: Physician Assistant

## 2022-08-21 DIAGNOSIS — Z952 Presence of prosthetic heart valve: Secondary | ICD-10-CM | POA: Diagnosis not present

## 2022-08-21 LAB — ECHOCARDIOGRAM COMPLETE
AR max vel: 2.26 cm2
AV Area VTI: 2.15 cm2
AV Area mean vel: 1.82 cm2
AV Mean grad: 8 mmHg
AV Peak grad: 10.4 mmHg
Ao pk vel: 1.61 m/s
Area-P 1/2: 3.61 cm2
S' Lateral: 2.6 cm

## 2022-09-07 ENCOUNTER — Other Ambulatory Visit: Payer: Self-pay | Admitting: Physician Assistant

## 2022-09-07 DIAGNOSIS — R053 Chronic cough: Secondary | ICD-10-CM

## 2022-10-04 ENCOUNTER — Other Ambulatory Visit: Payer: Self-pay | Admitting: Physician Assistant

## 2022-10-11 ENCOUNTER — Telehealth: Payer: Self-pay

## 2022-10-11 NOTE — Telephone Encounter (Signed)
Spoke with patient's daughter [name] and scheduled a in person Palliative Consult for 10/16/22 @ 10:15 AM.  Consent obtained; updated Netsmart, Team List and Epic.

## 2022-10-16 ENCOUNTER — Other Ambulatory Visit: Payer: Medicare Other | Admitting: Primary Care

## 2022-10-16 DIAGNOSIS — J189 Pneumonia, unspecified organism: Secondary | ICD-10-CM

## 2022-10-16 DIAGNOSIS — Z515 Encounter for palliative care: Secondary | ICD-10-CM

## 2022-10-16 NOTE — Progress Notes (Addendum)
[facility] Consult Note Telephone: [phone]  Fax: [phone]   Date of encounter: 10/16/22 10:35 AM PATIENT NAME: Jacob Simpson [address] [place] [address]   [phone] (home)  DOB: 1930/03/13 MRN: [id number] PRIMARY CARE PROVIDER:    Barbaraann Boys, MD,  [place] [address] [phone]  REFERRING PROVIDER:   Barbaraann Boys, [employer] [place],  [place] [address] [phone]  RESPONSIBLE PARTY:    Contact Information     Name Relation Home Work [place] Daughter   [phone]   [place] Daughter   [phone]   Farrel [place] Daughter   [phone]       I met face to face with patient and family in  home. Palliative Care was asked to follow this patient by consultation request of  Barbaraann Boys, MD to address advance care planning and complex medical decision making. This is the initial visit.                                     ASSESSMENT AND PLAN / RECOMMENDATIONS:   Advance Care Planning/Goals of Care: Goals include to maximize quality of life and symptom management. Patient/health care surrogate gave his/her permission to discuss.Our advance care planning conversation included a discussion about:    The value and importance of advance care planning  Exploration of personal, cultural or spiritual beliefs that might influence medical decisions  Exploration of goals of care in the event of a sudden injury or illness  Identification of a healthcare agent - Sharon/Sandra  but Levada Dy helps most days. Review of an  advance directive document - MOST left CODE STATUS: DNR  Symptom Management/Plan:  ADLS: has assistance with adls, has paid as well as family. States good health. (I) with hygiene and dress, toilet, self feeding. Can make a sandwich (I).  Infections: Had pneumonia now resolved.  Second bout since March 2023. Discussed possible silent aspiration.  No recent ST eval. Clinical sx have resolved, had f/u appt last week. Labs and x ray reviewed.  Immunizations: Discussed getting respiratory immunizations this season. Usually does take these: Flu, RSV and covid. States pna immunizations up to date. However, only flu noted this year.  Urination: Endorses occ hesitancy, taking lasix and flomax. Recommended to d/c benadryl and continue with second and third gen anti histamines.  Constipation: Takes senna, takes occasional miralax. Discussed bowel regimen to avoid constipation.  Nutrition; Improving, daughter states he is eating better.  Mobility: Goes out to meals sometimes, church, goes to nursing home to visit friend. Uses cane for ambulation. Denies falls, goes up and down stairs.   Follow up Palliative Care Visit: Instructed of d/c of home pal care NP visiting program.  This visit was coded based on medical decision making (MDM).  PPS: 50% HOSPICE ELIGIBILITY/DIAGNOSIS: no  Chief Complaint: recent pneumonia  HISTORY OF PRESENT ILLNESS:  TAMI BARREN is a 88 y.o. year old male  with recent pneumonia, resolved, h/o lymphoma, stable . Patient seen today to review palliative care needs to include medical decision making and advance care planning as appropriate.   History obtained from review of EMR, discussion with primary team, and interview with family, facility staff/caregiver and/or Mr. Mundie.  I reviewed available labs, medications, imaging, studies and related documents from the EMR.  Records reviewed and summarized above.   ROS  General: NAD EYES: denies vision changes ENMT: denies dysphagia Cardiovascular: denies chest pain, denies DOE Pulmonary: denies cough, denies increased SOB Abdomen: endorses good appetite, endorses occ  constipation, endorses continence of bowel GU: denies dysuria, occ hesitancy, endorses continence of urine MSK:  denies increased weakness,  no falls reported Skin: denies rashes or  wounds Neurological: denies pain, denies insomnia Psych: Endorses positive mood Heme/lymph/immuno: denies bruises, abnormal bleeding  Physical Exam: Current and past weights: 168 lbs Constitutional: NAD General: frail appearing, WNWD EYES: anicteric sclera, lids intact, no discharge  ENMT: hard of hearing, oral mucous membranes moist, dentition intact CV: no LE edema Pulmonary: no increased work of breathing, no cough, room air Abdomen: intake 100%, soft and non tender, no ascites GU: deferred MSK: mild sarcopenia, moves all extremities, ambulatory Skin: warm and dry, no rashes or wounds on visible skin Neuro:  no generalized weakness,  mild  cognitive impairment Psych: non-anxious affect, A and O x 3 Hem/lymph/immuno: no widespread bruising CURRENT PROBLEM LIST:  Patient Active Problem List   Diagnosis Date Noted   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   Radiculitis, lumbosacral    PAF (paroxysmal atrial fibrillation) (HCC)    Lymphoma (HCC)    Carotid artery disease (HCC)    History of TIA (transient ischemic attack)    HTN (hypertension) 03/10/2019   Severe aortic stenosis 03/10/2019   Acute on chronic diastolic heart failure ([facility] [place]) 02/24/2019   PAST MEDICAL HISTORY:  Active Ambulatory Problems    Diagnosis Date Noted   Acute on chronic diastolic heart failure ([place]) 02/24/2019   HTN (hypertension) 03/10/2019   Severe aortic stenosis 03/10/2019   Radiculitis, lumbosacral    PAF (paroxysmal atrial fibrillation) (HCC)    Lymphoma (HCC)    Carotid artery disease (HCC)    History of TIA (transient ischemic attack)    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   Resolved Ambulatory Problems    Diagnosis Date Noted   Pain in limb 09/28/2013   Arthritis of foot, degenerative 09/28/2013   Hypotension 07/05/2019   Past Medical History:  Diagnosis Date   Aortic aneurysm (HCC)    Asthma    Chronic diastolic CHF (congestive heart  failure) (HCC)    Diabetes mellitus ([facility])    [place]    Pneumonia    SOCIAL HX:  Social History   Tobacco Use   Smoking status: Former   Smokeless tobacco: Never   Tobacco comments:    quit 1989  Substance Use Topics   Alcohol use: No   FAMILY HX:  Family History  Problem Relation Age of Onset   Heart disease Father       ALLERGIES:  Allergies  Allergen Reactions   Penicillins Rash    Did it involve swelling of the face/tongue/throat, SOB, or low BP? Unknown Did it involve sudden or severe rash/hives, skin peeling, or any reaction on the inside of your mouth or nose? Unknown Did you need to seek medical attention at a hospital or doctor's office? Unknown When did it last happen?      Unknown If all above answers are "NO", may proceed with cephalosporin use.      PERTINENT MEDICATIONS:  Outpatient Encounter Medications as of 10/16/2022  Medication Sig   acetaminophen (TYLENOL) 500 MG tablet Take 1,000 mg by mouth every 6 (six) hours as needed for mild pain, moderate pain or fever.   amLODipine (NORVASC) 5 MG tablet Take 1 tablet (5 mg total) by mouth daily.  apixaban (ELIQUIS) 2.5 MG TABS tablet TAKE 1 TABLET (2.5 mg) BY MOUTH TWICE DAILY   atorvastatin (LIPITOR) 40 MG tablet Take 40 mg by mouth daily.   azelastine (ASTELIN) 0.1 % nasal spray Place 1 spray into both nostrils 2 (two) times daily as needed for rhinitis or allergies. Use in each nostril as directed   benzonatate (TESSALON) 200 MG capsule Take 1 capsule (200 mg total) by mouth 3 (three) times daily as needed (As needed for cough).   carvedilol (COREG) 25 MG tablet TAKE 1 TABLET BY MOUTH TWICE A DAY   cetirizine (ZYRTEC) 10 MG tablet Take 10 mg by mouth daily as needed for allergies.    Cholecalciferol (VITAMIN D) 50 MCG (2000 UT) tablet Take 2,000 Units by mouth daily.   diphenhydrAMINE (BENADRYL) 25 MG tablet Take 25 mg by mouth every morning.   furosemide (LASIX) 20 MG tablet Take 1 tablet (20 mg total)  by mouth as directed. Take one tablet every Monday/Friday with an extra pill as needed for shortness of breath.   meclizine (ANTIVERT) 12.5 MG tablet Take 12.5 mg by mouth 3 (three) times daily as needed for dizziness.   mirtazapine (REMERON) 15 MG tablet Take 15 mg by mouth at bedtime.   omeprazole (PRILOSEC) 20 MG capsule Take 20 mg by mouth daily as needed (stomach acid).   ondansetron (ZOFRAN-ODT) 8 MG disintegrating tablet Take 8 mg by mouth every 8 (eight) hours as needed for nausea or vomiting.   polyethylene glycol (MIRALAX) 17 g packet Take 17 g by mouth daily as needed for moderate constipation.   senna-docusate (SENOKOT-S) 8.6-50 MG tablet Take by mouth as needed.   tamsulosin (FLOMAX) 0.4 MG CAPS capsule Take 1 capsule (0.4 mg total) by mouth daily after supper.   No facility-administered encounter medications on file as of 10/16/2022.   Thank you for the opportunity to participate in the care of Mr. Marineau. Please call our office at [phone] if we can be of additional assistance.   Jason Coop, NP , DNP, AGPCNP-BC  COVID-19 PATIENT SCREENING TOOL Asked and negative response unless otherwise noted:  Have you had symptoms of covid, tested positive or been in contact with someone with symptoms/positive test in the past 5-10 days?

## 2022-11-01 ENCOUNTER — Other Ambulatory Visit: Payer: Medicare Other

## 2022-11-02 ENCOUNTER — Ambulatory Visit
Admission: RE | Admit: 2022-11-02 | Discharge: 2022-11-02 | Disposition: A | Discharge status: Home / Self Care | Payer: Medicare Other | Source: Ambulatory Visit | Attending: Physician Assistant | Admitting: Physician Assistant

## 2022-11-02 DIAGNOSIS — R053 Chronic cough: Secondary | ICD-10-CM | POA: Diagnosis present

## 2022-12-09 ENCOUNTER — Emergency Department: Payer: Medicare Other

## 2022-12-09 ENCOUNTER — Encounter: Payer: Self-pay | Admitting: Emergency Medicine

## 2022-12-09 ENCOUNTER — Other Ambulatory Visit: Payer: Self-pay

## 2022-12-09 DIAGNOSIS — R778 Other specified abnormalities of plasma proteins: Secondary | ICD-10-CM | POA: Diagnosis not present

## 2022-12-09 DIAGNOSIS — E860 Dehydration: Secondary | ICD-10-CM | POA: Insufficient documentation

## 2022-12-09 DIAGNOSIS — Z7901 Long term (current) use of anticoagulants: Secondary | ICD-10-CM | POA: Diagnosis not present

## 2022-12-09 DIAGNOSIS — Z8572 Personal history of non-Hodgkin lymphomas: Secondary | ICD-10-CM | POA: Insufficient documentation

## 2022-12-09 DIAGNOSIS — I5033 Acute on chronic diastolic (congestive) heart failure: Secondary | ICD-10-CM | POA: Insufficient documentation

## 2022-12-09 DIAGNOSIS — J101 Influenza due to other identified influenza virus with other respiratory manifestations: Secondary | ICD-10-CM | POA: Diagnosis not present

## 2022-12-09 DIAGNOSIS — N179 Acute kidney failure, unspecified: Secondary | ICD-10-CM | POA: Diagnosis not present

## 2022-12-09 DIAGNOSIS — Z1152 Encounter for screening for COVID-19: Secondary | ICD-10-CM | POA: Diagnosis not present

## 2022-12-09 DIAGNOSIS — R109 Unspecified abdominal pain: Secondary | ICD-10-CM | POA: Diagnosis present

## 2022-12-09 LAB — CBC WITH DIFFERENTIAL/PLATELET
Abs Immature Granulocytes: 0.04 10*3/uL (ref 0.00–0.07)
Basophils Absolute: 0 10*3/uL (ref 0.0–0.1)
Basophils Relative: 0 %
Eosinophils Absolute: 0 10*3/uL (ref 0.0–0.5)
Eosinophils Relative: 0 %
HCT: 39 % (ref 39.0–52.0)
Hemoglobin: 12.5 g/dL — ABNORMAL LOW (ref 13.0–17.0)
Immature Granulocytes: 0 %
Lymphocytes Relative: 14 %
Lymphs Abs: 1.4 10*3/uL (ref 0.7–4.0)
MCH: 30.6 pg (ref 26.0–34.0)
MCHC: 32.1 g/dL (ref 30.0–36.0)
MCV: 95.4 fL (ref 80.0–100.0)
Monocytes Absolute: 1 10*3/uL (ref 0.1–1.0)
Monocytes Relative: 10 %
Neutro Abs: 7.5 10*3/uL (ref 1.7–7.7)
Neutrophils Relative %: 76 %
Platelets: 176 10*3/uL (ref 150–400)
RBC: 4.09 MIL/uL — ABNORMAL LOW (ref 4.22–5.81)
RDW: 13.8 % (ref 11.5–15.5)
WBC: 10 10*3/uL (ref 4.0–10.5)
nRBC: 0 % (ref 0.0–0.2)

## 2022-12-09 LAB — LACTIC ACID, PLASMA: Lactic Acid, Venous: 1.6 mmol/L (ref 0.5–1.9)

## 2022-12-09 LAB — COMPREHENSIVE METABOLIC PANEL
ALT: 15 U/L (ref 0–44)
AST: 38 U/L (ref 15–41)
Albumin: 3.3 g/dL — ABNORMAL LOW (ref 3.5–5.0)
Alkaline Phosphatase: 52 U/L (ref 38–126)
Anion gap: 8 (ref 5–15)
BUN: 27 mg/dL — ABNORMAL HIGH (ref 8–23)
CO2: 25 mmol/L (ref 22–32)
Calcium: 8.1 mg/dL — ABNORMAL LOW (ref 8.9–10.3)
Chloride: 105 mmol/L (ref 98–111)
Creatinine, Ser: 1.96 mg/dL — ABNORMAL HIGH (ref 0.61–1.24)
GFR, Estimated: 32 mL/min — ABNORMAL LOW (ref >60)
Glucose, Bld: 127 mg/dL — ABNORMAL HIGH (ref 70–99)
Potassium: 3.9 mmol/L (ref 3.5–5.1)
Sodium: 138 mmol/L (ref 135–145)
Total Bilirubin: 1.1 mg/dL (ref 0.3–1.2)
Total Protein: 7.1 g/dL (ref 6.5–8.1)

## 2022-12-09 LAB — RESP PANEL BY RT-PCR (RSV, FLU A&B, COVID)  RVPGX2
Influenza A by PCR: POSITIVE — AB
Influenza B by PCR: NEGATIVE
Resp Syncytial Virus by PCR: NEGATIVE
SARS Coronavirus 2 by RT PCR: NEGATIVE

## 2022-12-09 LAB — TROPONIN I (HIGH SENSITIVITY): Troponin I (High Sensitivity): 146 ng/L (ref <18)

## 2022-12-09 NOTE — ED Triage Notes (Signed)
Pt via POV from home, lives alone, c/o feeling dehydrated. Pt c/o abdominal cramping along with nausea, diarrhea, fevers, cough, and decreased PO intake. Pt currently on antibiotic for PNA for past 5-6 days. S/s ongoing for 2 days. Denies CP/SOB   In triage pt has labored breathing, 92% room air, was placed on 2 L nasal cannula.

## 2022-12-09 NOTE — ED Provider Triage Note (Signed)
Emergency Medicine Provider Triage Evaluation Note  Jacob Simpson , a 88 y.o. male  was evaluated in triage.  Pt complains of pneumonia, feeling worse, weakness, difficulty breathing, abdominal pain and diarrhea.  Review of Systems  Positive:  Negative:   Physical Exam  BP 96/69 (BP Location: Right Arm)   Pulse 74   Temp 97.8 F (36.6 C) (Oral)   Resp 18   Ht '5\' 10"'$  (1.778 m)   Wt 72.6 kg   SpO2 94%   BMI 22.96 kg/m  Gen:   Awake, no distress   Resp:  Increased respiratory effort MSK:   Moves extremities without difficulty  Other:    Medical Decision Making  Medically screening exam initiated at 9:05 PM.  Appropriate orders placed.  JULION GATT was informed that the remainder of the evaluation will be completed by another provider, this initial triage assessment does not replace that evaluation, and the importance of remaining in the ED until their evaluation is complete.     Versie Starks, PA-C 12/09/22 2106

## 2022-12-10 ENCOUNTER — Emergency Department
Admission: EM | Admit: 2022-12-10 | Discharge: 2022-12-10 | Disposition: A | Discharge status: Home / Self Care | Payer: Medicare Other | Attending: Emergency Medicine | Admitting: Emergency Medicine

## 2022-12-10 DIAGNOSIS — J101 Influenza due to other identified influenza virus with other respiratory manifestations: Secondary | ICD-10-CM

## 2022-12-10 DIAGNOSIS — R7989 Other specified abnormal findings of blood chemistry: Secondary | ICD-10-CM

## 2022-12-10 DIAGNOSIS — E860 Dehydration: Secondary | ICD-10-CM

## 2022-12-10 DIAGNOSIS — N179 Acute kidney failure, unspecified: Secondary | ICD-10-CM

## 2022-12-10 LAB — LACTIC ACID, PLASMA: Lactic Acid, Venous: 1.4 mmol/L (ref 0.5–1.9)

## 2022-12-10 LAB — TROPONIN I (HIGH SENSITIVITY): Troponin I (High Sensitivity): 139 ng/L (ref <18)

## 2022-12-10 LAB — PROCALCITONIN: Procalcitonin: <0.1 ng/mL

## 2022-12-10 MED ORDER — ACETAMINOPHEN 500 MG PO TABS
1000.0000 mg | ORAL_TABLET | Freq: Once | ORAL | Status: AC
  Administered 2022-12-10: 1000 mg via ORAL
  Filled 2022-12-10: qty 2

## 2022-12-10 MED ORDER — LACTATED RINGERS IV BOLUS
1000.0000 mL | Freq: Once | INTRAVENOUS | Status: AC
  Administered 2022-12-10: 1000 mL via INTRAVENOUS

## 2022-12-10 MED ORDER — SODIUM CHLORIDE 0.9 % IV SOLN: 500.0000 mg | Freq: Once | INTRAVENOUS | Status: DC

## 2022-12-10 MED ORDER — SODIUM CHLORIDE 0.9 % IV SOLN: 1.0000 g | Freq: Once | INTRAVENOUS | Status: DC

## 2022-12-10 NOTE — ED Notes (Signed)
Replaced pts oxygen tank

## 2022-12-10 NOTE — ED Notes (Signed)
Pt to rm 11 at this time. Pt assisted from the recliner to the bed. Pt placed on cardiac monitor. Call light within reach. Pt dtr at bedside. Mel Almond, RN made aware of pt being in rm.

## 2022-12-10 NOTE — ED Notes (Signed)
Pt brought back to room 11 from main lobby. Pt's family remains at bedside. Pt connected to VS monitor. Pt on 2 L nasal cannula. Call bell within reach. Pending EDP assessment.

## 2022-12-10 NOTE — ED Provider Notes (Signed)
[facility] Provider Note    Event Date/Time   First MD Initiated Contact with Patient 12/10/22 [phone]     (approximate)   History   Abdominal Pain   HPI Level 5 caveat: Patient is extremely hard of hearing and much of the interview was conducted with his daughter, both directly and with her assistance at bedside.  Jacob Simpson is a 88 y.o. male with a prior medical history that includes paroxysmal atrial fibrillation on Eliquis, acute on chronic diastolic heart failure, and a prior history of lymphoma.  He has also had a transcatheter aortic valve replacement and again takes Eliquis.  He presents for evaluation of feeling dehydrated, tired, and having abdominal cramping with nausea, decreased oral intake, diarrhea, intermittent fever and chills, and cough.  The symptoms in total been going on for 5 or 6 days and he has been treated as an outpatient for pneumonia.  His symptoms of gotten worse over the last 2 days.  He feels more short of breath with exertion but feels okay at rest.  He does not have an oxygen requirement although he was started on some oxygen for comfort while in triage.  He has no prior history of heart attack and has not been having any chest pain.  He does not have any abdominal pain other than some cramping.     Physical Exam   Triage Vital Signs: ED Triage Vitals  Enc Vitals Group     BP 12/09/22 2050 96/69     Pulse Rate 12/09/22 2050 74     Resp 12/09/22 2050 18     Temp 12/09/22 2050 97.8 F (36.6 C)     Temp Source 12/09/22 2050 Oral     SpO2 12/09/22 2050 94 %     Weight 12/09/22 2051 72.6 kg (160 lb)     Height 12/09/22 2051 1.778 m ('5\' 10"'$ )     Head Circumference --      Peak Flow --      Pain Score 12/09/22 2057 0     Pain Loc --      Pain Edu? --      Excl. in [place]? --     Most recent vital signs: Vitals:   12/10/22 0730 12/10/22 0825  BP: (!) 144/79 (!) 140/74  Pulse: (!) 59 (!) 57  Resp: (!) 21 17  Temp:     SpO2: 95% 97%     General: Awake, no distress.  Pleasant and conversant.  Very hard of hearing. CV:  Good peripheral perfusion.  Borderline bradycardia.   Resp:  Normal effort.  Lungs are clear to auscultation.  No accessory muscle usage or intercostal retractions.  Speaking in full sentences. Abd:  No distention.  No tenderness to palpation. Other:  No focal neurological deficits.   ED Results / Procedures / Treatments   Labs (all labs ordered are listed, but only abnormal results are displayed) Labs Reviewed  RESP PANEL BY RT-PCR (RSV, FLU A&B, COVID)  RVPGX2 - Abnormal; Notable for the following components:      Result Value   Influenza A by PCR POSITIVE (*)    All other components within normal limits  COMPREHENSIVE METABOLIC PANEL - Abnormal; Notable for the following components:   Glucose, Bld 127 (*)    BUN 27 (*)    Creatinine, Ser 1.96 (*)    Calcium 8.1 (*)    Albumin 3.3 (*)    GFR, Estimated 32 (*)  All other components within normal limits  CBC WITH DIFFERENTIAL/PLATELET - Abnormal; Notable for the following components:   RBC 4.09 (*)    Hemoglobin 12.5 (*)    All other components within normal limits  TROPONIN I (HIGH SENSITIVITY) - Abnormal; Notable for the following components:   Troponin I (High Sensitivity) 146 (*)    All other components within normal limits  TROPONIN I (HIGH SENSITIVITY) - Abnormal; Notable for the following components:   Troponin I (High Sensitivity) 139 (*)    All other components within normal limits  CULTURE, BLOOD (ROUTINE X 2)  CULTURE, BLOOD (ROUTINE X 2)  LACTIC ACID, PLASMA  LACTIC ACID, PLASMA  PROCALCITONIN     EKG  ED ECG REPORT I, Hinda Kehr, the attending physician, personally viewed and interpreted this ECG.  Date: 12/09/2022 EKG Time: 21:47 Rate: 74 Rhythm: normal sinus rhythm QRS Axis: Left axis deviation Intervals: normal ST/T Wave abnormalities: Non-specific ST segment / T-wave changes, but no clear  evidence of acute ischemia. Narrative Interpretation: no definitive evidence of acute ischemia; does not meet STEMI criteria.    RADIOLOGY I viewed and interpreted the patient's two-view chest x-ray.  There is concern for pneumonia and both lower lungs.  Radiologist confirmed this as well as some small pleural effusions.  I also viewed and interpreted the patient's CT of the chest/abdomen/pelvis.  He has a chronic thoracic aortic aneurysm and airway disease most probably pneumonia including possibility of aspiration in the right lower lobe.    PROCEDURES:  Critical Care performed: No  .1-3 Lead EKG Interpretation  Performed by: Hinda Kehr, MD Authorized by: Hinda Kehr, MD     Interpretation: normal     ECG rate:  60   ECG rate assessment: normal     Rhythm: sinus rhythm     Ectopy: none     Conduction: normal      MEDICATIONS ORDERED IN ED: Medications  lactated ringers bolus 1,000 mL (0 mLs Intravenous Stopped 12/10/22 0342)  acetaminophen (TYLENOL) tablet 1,000 mg (1,000 mg Oral Given 12/10/22 0217)  lactated ringers bolus 1,000 mL (0 mLs Intravenous Stopped 12/10/22 0903)     IMPRESSION / MDM / ASSESSMENT AND PLAN / ED COURSE  I reviewed the triage vital signs and the nursing notes.                              Differential diagnosis includes, but is not limited to, viral illness, pneumonia, dehydration, acute renal failure, other metabolic or electrolyte abnormality, PE, ACS.  Patient's presentation is most consistent with acute presentation with potential threat to life or bodily function.  Labs/studies ordered: CBC with differential, CMP, respiratory viral panel, lactic acid, procalcitonin, blood cultures x 2, high-sensitivity troponin x 2, two-view chest x-ray, CT chest/abdomen/pelvis without contrast.  The patient is on the cardiac monitor to evaluate for evidence of arrhythmia and/or significant heart rate changes.  Labs are most notable for an  elevated creatinine at 1.96 which is greater than his baseline of around 1.5 although not substantially so.  Electrolytes are essentially normal.  Normal lactic acid, negative procalcitonin, no leukocytosis with an essentially normal hemoglobin.  He is influenza A positive.  His chest x-ray as documented above shows airway disease but he is already being treated for pneumonia and has at least 3 to 4 days left of treatment.  He had a CT of the chest/abdomen/pelvis ordered while he was in triage and it  confirms the airway disease.  The diagnosis of flu is new but he has had symptoms for 5 or 6 days and I do not feel that Tamiflu would substantially change his situation.  He already is dealing with GI complications which either are the result of the influenza, pneumonia, antibiotics, or a combination.  His labs are also notable for an elevated high-sensitivity troponin, initially 146 and now 139.  I talked extensively on 2 or 3 separate occasions with the patient and his daughter.  We discussed his lab results extensively, including his dehydration/elevated creatinine, influenza, history of recent pneumonia, and especially his elevated troponin.  He has no evidence of ischemia on his EKG and he is not having any chest pain.  His shortness of breath seems to be mostly exertional and he does not have an oxygen requirement.  When I woke him up from his rest he said that he felt better.  He received 1 L of fluid after being triaged and I ordered a second liter of fluid for him.  He had been observed for an extended period of time in the emergency department due to overwhelming ED and hospital patient volumes.  I offered admission for the reasons described above, but I also explained that it is unclear to me what exactly or how exactly he would be treated other than to continue to monitor him with repeat lab work and IV fluids.  Given that the patient is feeling better, he strongly prefers to go home.  His  daughter supports this plan.  I explained that I cannot necessarily explain the troponin although I suspect it is due to demand ischemia because of the strain on his body from his acute illnesses.  It likely also is contributed by his acute kidney injury.  They understand and they know to bring him back immediately if he starts to get worse.  They are comfortable with the plan for outpatient treatment, encouraging p.o. fluids, continuing his medications, and following up as an outpatient with his regular doctor.  The patient and his daughter are comfortable with this plan even though I did consider and discuss admission with them multiple times.      FINAL CLINICAL IMPRESSION(S) / ED DIAGNOSES   Final diagnoses:  Influenza A  Dehydration  Acute kidney injury ([place])  Elevated troponin level     Rx / DC Orders   ED Discharge Orders     None        Note:  This document was prepared using Dragon voice recognition software and may include unintentional dictation errors.   Hinda Kehr, MD 12/10/22 [phone]

## 2022-12-10 NOTE — Discharge Instructions (Signed)
As we discussed, you have multiple lab abnormalities and were diagnosed today with influenza A.  You should continue all of your regular medications including your recent antibiotics for pneumonia.  We discussed hospitalization, but given that you do not have an oxygen requirement and are feeling better after IV fluids, we agreed that you will be more comfortable at home.  However it is very important that you follow-up with your regular doctor for a follow-up appointment at the next available opportunity, and that you return to the emergency department immediately if you develop new or worsening symptoms.  Of note, please let your primary care provider know that your troponin was elevated, but that you were not having any chest pain and that you had a reassuring EKG, they may want to check the troponin again as an outpatient to make sure it is coming down (this is the lab test that is associated with your heart).

## 2022-12-15 LAB — CULTURE, BLOOD (ROUTINE X 2)
Culture: NO GROWTH
Culture: NO GROWTH
Special Requests: ADEQUATE

## 2022-12-21 ENCOUNTER — Telehealth: Payer: Self-pay

## 2022-12-21 NOTE — Telephone Encounter (Signed)
450 pm.  Incoming call from daughter Levada Dy.  Home visit scheduled for next Wednesday at 930.

## 2022-12-21 NOTE — Telephone Encounter (Signed)
310 pm.  Phone call made to daughter [name] to schedule a home visit with Palliative Care.  No answer.  Message left requesting a call back.

## 2022-12-26 ENCOUNTER — Telehealth: Payer: Self-pay

## 2022-12-26 ENCOUNTER — Other Ambulatory Visit: Payer: Medicare Other

## 2022-12-26 VITALS — BP 130/68 | HR 71 | Temp 98.0°F

## 2022-12-26 DIAGNOSIS — Z515 Encounter for palliative care: Secondary | ICD-10-CM

## 2022-12-26 NOTE — Telephone Encounter (Signed)
615 pm.  Message received from daughter Levada Dy requesting discharge from [facility].  She advised this service is not covered under patient's insurance.  Discussed further and daughter is wanting to see if oncology agrees with a hospice referral given more support that is offered with that service.   Will discharge at this time but advised she could call us if services were needed in the future.

## 2022-12-26 NOTE — Progress Notes (Signed)
PATIENT NAME: Jacob Simpson DOB: [birth year] MRN: [id number]  PRIMARY CARE PROVIDER: Barbaraann Boys, MD  RESPONSIBLE PARTY:  Acct ID - Guarantor Home Phone Work Phone Relationship Acct Type  [id number] ARLIS, YALE* [phone]  Self P/F     [place], [place], [place] [id number]    Initial visit completed with patient and daughter Levada Dy.  Services explained and consents signed.   Diffuse Large Cell B Lymphoma:  Currently stable per daughter and they are only seeing oncology 1 x per year.  No current treatment in place.  We discussed possible need for follow up sooner given significant weight loss.   Fatigue:  Mostly remaining in the recliner chair.  Sleeping 12-16 hours a day.   Pneumonia:  Seen at [facility] this week and new diagnosis of pneumonia.  Currently being treated with Levaquin and Cleocin.  Daughter used expired albuterol nebulizer due to wheezing/cough during the night.  This was effective.  I have contacted PCP office and spoke with Shawn regarding nebulizer refill.   Weight loss: Noted 15-20 lb weight loss since July 2023.  Patient reports not having an appetite.  Continues to drink liquids well but very little food intake.  Ate a jello this am but nothing more.  PCP also notified of weight loss and patient's desire to try something to stimulate his appetite.    CODE STATUS: DNR ADVANCED DIRECTIVES: Yes MOST FORM: No PPS: 50%   PHYSICAL EXAM:   VITALS: Today's Vitals   12/26/22 0955  BP: 130/68  Pulse: 71  Temp: 98 F (36.7 C)  SpO2: 93%    LUNGS:  scattered wheezing and rhonchi present to bilateral lobes CARDIAC: Cor RRR}  EXTREMITIES: - for edema SKIN: Skin color, texture, turgor normal. No rashes or lesions or mobility and turgor normal  NEURO: positive for gait problems       Lorenza Burton, RN

## 2022-12-31 ENCOUNTER — Other Ambulatory Visit: Payer: Self-pay | Admitting: Physician Assistant

## 2023-01-09 ENCOUNTER — Other Ambulatory Visit: Payer: Medicare Other

## 2023-03-15 ENCOUNTER — Other Ambulatory Visit: Payer: Self-pay | Admitting: Cardiovascular Disease

## 2023-03-15 NOTE — Telephone Encounter (Signed)
Refill Request.  

## 2023-03-18 NOTE — Telephone Encounter (Signed)
Prescription refill request for Eliquis received. Indication:  PAF Last office visit: 07/19/22  R Dunn PA-C Scr: 1.5 on 12/13/22 Epic Age: 89 Weight: 76.4kg  Based on above findings Eliquis 2.5mg  twice daily is the appropriate dose.  Pt is past due for apprt with MD.  Message sent to [place] to make appt.  Refill approved x 1.

## 2023-03-19 NOTE — Telephone Encounter (Signed)
Patient on hospice Patient daughter will call back if appointment needed later this week or hospice will take care of it

## 2023-03-31 ENCOUNTER — Other Ambulatory Visit: Payer: Self-pay | Admitting: Physician Assistant

## 2023-04-01 NOTE — Telephone Encounter (Signed)
Please schedule overdue F/U appointment for refills. Thank you! 

## 2023-04-09 ENCOUNTER — Emergency Department
Admission: EM | Admit: 2023-04-09 | Discharge: 2023-04-10 | Disposition: A | Discharge status: Home / Self Care | Attending: Emergency Medicine | Admitting: Emergency Medicine

## 2023-04-09 ENCOUNTER — Emergency Department

## 2023-04-09 DIAGNOSIS — R4182 Altered mental status, unspecified: Secondary | ICD-10-CM | POA: Insufficient documentation

## 2023-04-09 DIAGNOSIS — R531 Weakness: Secondary | ICD-10-CM | POA: Diagnosis not present

## 2023-04-09 DIAGNOSIS — Z7901 Long term (current) use of anticoagulants: Secondary | ICD-10-CM | POA: Diagnosis not present

## 2023-04-09 DIAGNOSIS — Z8571 Personal history of Hodgkin lymphoma: Secondary | ICD-10-CM | POA: Insufficient documentation

## 2023-04-09 LAB — BASIC METABOLIC PANEL
Anion gap: 10 (ref 5–15)
BUN: 22 mg/dL (ref 8–23)
CO2: 20 mmol/L — ABNORMAL LOW (ref 22–32)
Calcium: 7.9 mg/dL — ABNORMAL LOW (ref 8.9–10.3)
Chloride: 107 mmol/L (ref 98–111)
Creatinine, Ser: 1.97 mg/dL — ABNORMAL HIGH (ref 0.61–1.24)
GFR, Estimated: 31 mL/min — ABNORMAL LOW (ref >60)
Glucose, Bld: 170 mg/dL — ABNORMAL HIGH (ref 70–99)
Potassium: 3.6 mmol/L (ref 3.5–5.1)
Sodium: 137 mmol/L (ref 135–145)

## 2023-04-09 LAB — CBC
HCT: 34 % — ABNORMAL LOW (ref 39.0–52.0)
Hemoglobin: 11.2 g/dL — ABNORMAL LOW (ref 13.0–17.0)
MCH: 30.7 pg (ref 26.0–34.0)
MCHC: 32.9 g/dL (ref 30.0–36.0)
MCV: 93.2 fL (ref 80.0–100.0)
Platelets: 254 10*3/uL (ref 150–400)
RBC: 3.65 MIL/uL — ABNORMAL LOW (ref 4.22–5.81)
RDW: 14.1 % (ref 11.5–15.5)
WBC: 13.6 10*3/uL — ABNORMAL HIGH (ref 4.0–10.5)
nRBC: 0 % (ref 0.0–0.2)

## 2023-04-09 LAB — MAGNESIUM: Magnesium: 2.1 mg/dL (ref 1.7–2.4)

## 2023-04-09 MED ORDER — SODIUM CHLORIDE 0.9 % IV BOLUS
1000.0000 mL | Freq: Once | INTRAVENOUS | Status: AC
  Administered 2023-04-09: 1000 mL via INTRAVENOUS

## 2023-04-09 MED ORDER — LEVOFLOXACIN 500 MG PO TABS
750.0000 mg | ORAL_TABLET | Freq: Once | ORAL | Status: AC
  Administered 2023-04-09: 750 mg via ORAL
  Filled 2023-04-09: qty 2

## 2023-04-09 MED ORDER — ACETAMINOPHEN 500 MG PO TABS
1000.0000 mg | ORAL_TABLET | Freq: Once | ORAL | Status: AC
  Administered 2023-04-09: 1000 mg via ORAL
  Filled 2023-04-09: qty 2

## 2023-04-09 MED ORDER — LEVOFLOXACIN 750 MG PO TABS: 750.0000 mg | ORAL_TABLET | Freq: Every day | ORAL | 0 refills | Status: AC

## 2023-04-09 MED ORDER — MORPHINE SULFATE (PF) 2 MG/ML IV SOLN
2.0000 mg | Freq: Once | INTRAVENOUS | Status: AC
  Administered 2023-04-09: 2 mg via INTRAVENOUS
  Filled 2023-04-09: qty 1

## 2023-04-09 NOTE — ED Notes (Signed)
ED Provider at bedside. 

## 2023-04-09 NOTE — ED Notes (Signed)
Daughter at the bedside

## 2023-04-09 NOTE — ED Provider Notes (Signed)
[facility] Provider Note    Event Date/Time   First MD Initiated Contact with Patient 04/09/23 2119     (approximate)   History   Altered Mental Status   HPI  Jacob Simpson is a 89 y.o. male past medical history significant for B-cell lymphoma, chronic pneumonia, who presents to the emergency department following an episode of altered mental status.  History is provided by the patient's daughter at bedside.  Normal state of health today and normally is at home and gets hospice care 3 times a week.  Ambulates with minimal insistence and is able to bathe on his own and feed himself.  Today had an episode where he had a sudden onset of generalized weakness and altered mental status.  Became very somnolent and was difficult to arouse.  Ate lunch earlier today and was in his normal state of health.  Denies any recent falls or trauma.  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  States that he would not want any surgery or any large intervention if he had any intracranial hemorrhage or any other surgical emergencies.  Would be okay with antibiotics to treat his ongoing pneumonia and states that he has had a chronic pneumonia and has been on antibiotics multiple times.  Concern because they did not want him to die at home and did not feel that they could care for him, wanted in home or in hospital hospice.     Physical Exam   Triage Vital Signs: ED Triage Vitals [04/09/23 2117]  Enc Vitals Group     BP      Pulse      Resp      Temp      Temp src      SpO2 98 %     Weight      Height      Head Circumference      Peak Flow      Pain Score      Pain Loc      Pain Edu?      Excl. in GC?     Most recent vital signs: Vitals:   04/09/23 2131 04/09/23 2200  BP:  (!) 153/84  Pulse:  66  Resp:  20  Temp:    SpO2: 99% 98%    Physical Exam Constitutional:      Appearance: He is well-developed.     Comments: Somnolent arouses to voice.  Hard of  hearing.  HENT:     Head: Atraumatic.  Eyes:     Conjunctiva/sclera: Conjunctivae normal.  Cardiovascular:     Rate and Rhythm: Regular rhythm.  Pulmonary:     Effort: No respiratory distress.  Abdominal:     General: There is no distension.  Musculoskeletal:     Cervical back: Normal range of motion.     Right lower leg: No edema.     Left lower leg: No edema.  Skin:    General: Skin is warm.  Neurological:     Mental Status: He is alert.     Comments: Able to raise bilateral upper and lower extremities to gravity.  Following simple commands.     IMPRESSION / MDM / ASSESSMENT AND PLAN / ED COURSE  I reviewed the triage vital signs and the nursing notes.  Differential diagnosis including sepsis, dysrhythmia, intracranial hemorrhage, electrolyte abnormality, pneumonia, urinary tract infection  EKG  No tachycardic or bradycardic dysrhythmias while on cardiac telemetry.  RADIOLOGY I independently reviewed  imaging, my interpretation of imaging: Chest x-ray concerning for right lower lobe opacity.  No significant change when compared to prior chest x-ray done in January.  Read as patchy infiltrates infectious versus inflammatory.  LABS (all labs ordered are listed, but only abnormal results are displayed) Labs interpreted as -    Labs Reviewed  CBC - Abnormal; Notable for the following components:      Result Value   WBC 13.6 (*)    RBC 3.65 (*)    Hemoglobin 11.2 (*)    HCT 34.0 (*)    All other components within normal limits  BASIC METABOLIC PANEL - Abnormal; Notable for the following components:   CO2 20 (*)    Glucose, Bld 170 (*)    Creatinine, Ser 1.97 (*)    Calcium 7.9 (*)    GFR, Estimated 31 (*)    All other components within normal limits  MAGNESIUM  URINALYSIS, W/ REFLEX TO CULTURE (INFECTION SUSPECTED)     MDM  Creatinine at his baseline.  Mild leukocytosis.  CO2 is low at 20.  No significant electrolyte abnormalities.  Patient given oral  Levaquin given his concerning findings on his chest x-ray for possible pneumonia.  Consulted hospice, do have a hospice bed, plan to discharge to hospice care  Hospice bed is available.  Plan to discharge the patient to hospice care.  Printed out prescription for Levaquin.     PROCEDURES:  Critical Care performed: No  Procedures  Patient's presentation is most consistent with acute presentation with potential threat to life or bodily function.   MEDICATIONS ORDERED IN ED: Medications  sodium chloride 0.9 % bolus 1,000 mL (0 mLs Intravenous Stopped 04/09/23 2318)  acetaminophen (TYLENOL) tablet 1,000 mg (1,000 mg Oral Given 04/09/23 2232)  levofloxacin (LEVAQUIN) tablet 750 mg (750 mg Oral Given 04/09/23 2307)    FINAL CLINICAL IMPRESSION(S) / ED DIAGNOSES   Final diagnoses:  Altered mental status, unspecified altered mental status type     Rx / DC Orders   ED Discharge Orders          Ordered    levofloxacin (LEVAQUIN) 750 MG tablet  Daily        04/09/23 2300             Note:  This document was prepared using Dragon voice recognition software and may include unintentional dictation errors.   Corena Herter, MD 04/09/23 2330

## 2023-04-09 NOTE — ED Notes (Signed)
Per Nedra Hai with Hospice, bed available at the [facility] care. Will arrange transport with [facility]. Pts family updated on plan of care.

## 2023-04-09 NOTE — ED Notes (Signed)
Patient able to follow commands, confused, and drowsy.

## 2023-04-09 NOTE — ED Notes (Signed)
WRITER CALLED TO HOSPICE HOUSE TO INFORM OF TRANSITIONAL CARE REQUEST BY FAMILY. PER HOSPICE HOUSE, THEY COULD NOT APPROVE AT THIS TIME AND WOULD NEED TO CALL THE SUPERVISOR AND THEN CALL WRITER BACK WITH UPDATE INFORMATION.

## 2023-04-09 NOTE — ED Triage Notes (Signed)
Pt BIB EMS from home for "stroke like symptoms". EMS reports family concern in mental status changes since 1930 where he was "walking and talking". Patient is minimally responsive to painful stimuli at triage. Pt is a DNR and home hospice patient. Hospice nurse was notified.

## 2023-04-09 NOTE — ED Notes (Addendum)
HOSPICE RN, Jamirra Curnow, CALLED AND SPOKE WITH WRITER. REPORT: FAMILY REPORTED THAT PT HAD RETURNED FROM BATHROOM AND WHILE SITTING DOWN, WENT UNRESPONSIVE AND DAUGHTER SANDRA WHO IS A NURSE REPORTED THAT HE DID NOT HAVE A PULSE. THE DAUGHTER ALSO REPORTED TO HOSPICE RN THAT SHE WANTED HIM SENT OUT BECAUSE IF HE IS PASSING THAT SHE DID NOT WANT HIM TO PASS AT THE HOME.   HOSPICE RN REPORTS THAT PTS DAUGHTER: ANGELA ROBINSON IS MAIN CONTACT FOR PT   RECENT INCREASE IN CONFUSION, REFUSAL IN TAKING MEDS, INCREASE IN MORE TIME IN BED

## 2023-04-10 NOTE — ED Notes (Addendum)
[facility] ARRIVED AND BETH, RN AT [facility] HOME UPDATED ON PTS DEPARTURE TIME.

## 2023-04-10 NOTE — ED Notes (Signed)
called to [facility] for transport to hospice/rep:[email or web address]

## 2023-04-30 ENCOUNTER — Other Ambulatory Visit: Payer: Self-pay | Admitting: Physician Assistant

## 2023-12-18 DEATH — deceased

## 2024-02-07 NOTE — Telephone Encounter (Signed)
 Left voicemail to schedule overdue appointment
